# Patient Record
Sex: Female | Born: 1940 | Race: White | Hispanic: No | Marital: Married | Smoking: Never smoker
Health system: Southern US, Community
[De-identification: ages and names within clinical notes are randomized; demographics above are authoritative.]

## PROBLEM LIST (undated history)

## (undated) DIAGNOSIS — E785 Hyperlipidemia, unspecified: Secondary | ICD-10-CM

## (undated) DIAGNOSIS — T7840XA Allergy, unspecified, initial encounter: Secondary | ICD-10-CM

## (undated) DIAGNOSIS — I10 Essential (primary) hypertension: Secondary | ICD-10-CM

## (undated) DIAGNOSIS — I480 Paroxysmal atrial fibrillation: Secondary | ICD-10-CM

## (undated) DIAGNOSIS — G43909 Migraine, unspecified, not intractable, without status migrainosus: Secondary | ICD-10-CM

## (undated) DIAGNOSIS — D649 Anemia, unspecified: Secondary | ICD-10-CM

## (undated) HISTORY — PX: BREAST BIOPSY: SHX20

## (undated) HISTORY — DX: Anemia, unspecified: D64.9

## (undated) HISTORY — DX: Hyperlipidemia, unspecified: E78.5

## (undated) HISTORY — PX: APPENDECTOMY: SHX54

## (undated) HISTORY — DX: Allergy, unspecified, initial encounter: T78.40XA

## (undated) HISTORY — PX: WISDOM TOOTH EXTRACTION: SHX21

## (undated) HISTORY — DX: Paroxysmal atrial fibrillation: I48.0

## (undated) HISTORY — DX: Migraine, unspecified, not intractable, without status migrainosus: G43.909

## (undated) HISTORY — DX: Essential (primary) hypertension: I10

---

## 1968-09-15 HISTORY — PX: ECTOPIC PREGNANCY SURGERY: SHX613

## 1998-11-26 ENCOUNTER — Ambulatory Visit (HOSPITAL_BASED_OUTPATIENT_CLINIC_OR_DEPARTMENT_OTHER): Admission: RE | Admit: 1998-11-26 | Discharge: 1998-11-26 | Payer: Self-pay

## 1999-10-02 ENCOUNTER — Encounter: Payer: Self-pay | Admitting: Family Medicine

## 1999-10-02 ENCOUNTER — Encounter: Admission: RE | Admit: 1999-10-02 | Discharge: 1999-10-02 | Payer: Self-pay | Admitting: Family Medicine

## 1999-10-04 ENCOUNTER — Ambulatory Visit (HOSPITAL_COMMUNITY): Admission: RE | Admit: 1999-10-04 | Discharge: 1999-10-04 | Payer: Self-pay | Admitting: Gastroenterology

## 1999-10-07 ENCOUNTER — Encounter: Admission: RE | Admit: 1999-10-07 | Discharge: 1999-10-07 | Payer: Self-pay | Admitting: Family Medicine

## 1999-10-07 ENCOUNTER — Encounter: Payer: Self-pay | Admitting: Family Medicine

## 1999-11-29 ENCOUNTER — Encounter: Payer: Self-pay | Admitting: Family Medicine

## 1999-11-29 ENCOUNTER — Ambulatory Visit (HOSPITAL_COMMUNITY): Admission: RE | Admit: 1999-11-29 | Discharge: 1999-11-29 | Payer: Self-pay | Admitting: Family Medicine

## 2000-02-19 DIAGNOSIS — C4492 Squamous cell carcinoma of skin, unspecified: Secondary | ICD-10-CM

## 2000-02-19 DIAGNOSIS — C4491 Basal cell carcinoma of skin, unspecified: Secondary | ICD-10-CM

## 2000-02-19 HISTORY — DX: Squamous cell carcinoma of skin, unspecified: C44.92

## 2000-02-19 HISTORY — DX: Basal cell carcinoma of skin, unspecified: C44.91

## 2000-10-05 ENCOUNTER — Encounter: Payer: Self-pay | Admitting: Family Medicine

## 2000-10-05 ENCOUNTER — Encounter: Admission: RE | Admit: 2000-10-05 | Discharge: 2000-10-05 | Payer: Self-pay | Admitting: Family Medicine

## 2000-12-15 ENCOUNTER — Ambulatory Visit (HOSPITAL_BASED_OUTPATIENT_CLINIC_OR_DEPARTMENT_OTHER): Admission: RE | Admit: 2000-12-15 | Discharge: 2000-12-15 | Payer: Self-pay | Admitting: Plastic Surgery

## 2001-02-11 ENCOUNTER — Other Ambulatory Visit: Admission: RE | Admit: 2001-02-11 | Discharge: 2001-02-11 | Payer: Self-pay | Admitting: Obstetrics and Gynecology

## 2001-02-11 ENCOUNTER — Encounter (INDEPENDENT_AMBULATORY_CARE_PROVIDER_SITE_OTHER): Payer: Self-pay

## 2001-10-06 ENCOUNTER — Encounter: Admission: RE | Admit: 2001-10-06 | Discharge: 2001-10-06 | Payer: Self-pay | Admitting: Family Medicine

## 2001-10-06 ENCOUNTER — Encounter: Payer: Self-pay | Admitting: Family Medicine

## 2001-10-08 ENCOUNTER — Encounter: Admission: RE | Admit: 2001-10-08 | Discharge: 2001-10-08 | Payer: Self-pay | Admitting: Family Medicine

## 2001-10-08 ENCOUNTER — Encounter: Payer: Self-pay | Admitting: Family Medicine

## 2001-10-20 DIAGNOSIS — D229 Melanocytic nevi, unspecified: Secondary | ICD-10-CM

## 2001-10-20 HISTORY — DX: Melanocytic nevi, unspecified: D22.9

## 2001-12-07 ENCOUNTER — Ambulatory Visit (HOSPITAL_BASED_OUTPATIENT_CLINIC_OR_DEPARTMENT_OTHER): Admission: RE | Admit: 2001-12-07 | Discharge: 2001-12-07 | Payer: Self-pay | Admitting: Plastic Surgery

## 2001-12-07 ENCOUNTER — Encounter (INDEPENDENT_AMBULATORY_CARE_PROVIDER_SITE_OTHER): Payer: Self-pay | Admitting: Specialist

## 2002-10-19 ENCOUNTER — Encounter: Payer: Self-pay | Admitting: Family Medicine

## 2002-10-19 ENCOUNTER — Encounter: Admission: RE | Admit: 2002-10-19 | Discharge: 2002-10-19 | Payer: Self-pay | Admitting: Family Medicine

## 2003-01-13 ENCOUNTER — Other Ambulatory Visit: Admission: RE | Admit: 2003-01-13 | Discharge: 2003-01-13 | Payer: Self-pay | Admitting: Family Medicine

## 2003-10-11 ENCOUNTER — Encounter: Admission: RE | Admit: 2003-10-11 | Discharge: 2003-10-11 | Payer: Self-pay | Admitting: Family Medicine

## 2003-11-01 ENCOUNTER — Encounter: Admission: RE | Admit: 2003-11-01 | Discharge: 2003-11-01 | Payer: Self-pay | Admitting: Family Medicine

## 2004-01-24 ENCOUNTER — Other Ambulatory Visit: Admission: RE | Admit: 2004-01-24 | Discharge: 2004-01-24 | Payer: Self-pay | Admitting: Family Medicine

## 2004-02-07 DIAGNOSIS — C4491 Basal cell carcinoma of skin, unspecified: Secondary | ICD-10-CM

## 2004-02-07 HISTORY — DX: Basal cell carcinoma of skin, unspecified: C44.91

## 2004-03-21 ENCOUNTER — Ambulatory Visit (HOSPITAL_COMMUNITY): Admission: RE | Admit: 2004-03-21 | Discharge: 2004-03-21 | Payer: Self-pay | Admitting: Plastic Surgery

## 2004-03-21 ENCOUNTER — Ambulatory Visit (HOSPITAL_BASED_OUTPATIENT_CLINIC_OR_DEPARTMENT_OTHER): Admission: RE | Admit: 2004-03-21 | Discharge: 2004-03-21 | Payer: Self-pay | Admitting: Plastic Surgery

## 2004-03-21 ENCOUNTER — Encounter (INDEPENDENT_AMBULATORY_CARE_PROVIDER_SITE_OTHER): Payer: Self-pay | Admitting: Specialist

## 2004-09-15 HISTORY — PX: COLONOSCOPY: SHX174

## 2005-01-22 ENCOUNTER — Encounter: Admission: RE | Admit: 2005-01-22 | Discharge: 2005-01-22 | Payer: Self-pay | Admitting: Family Medicine

## 2005-03-05 ENCOUNTER — Other Ambulatory Visit: Admission: RE | Admit: 2005-03-05 | Discharge: 2005-03-05 | Payer: Self-pay | Admitting: Family Medicine

## 2005-10-29 ENCOUNTER — Encounter: Admission: RE | Admit: 2005-10-29 | Discharge: 2005-10-29 | Payer: Self-pay | Admitting: Family Medicine

## 2006-01-26 ENCOUNTER — Encounter: Admission: RE | Admit: 2006-01-26 | Discharge: 2006-01-26 | Payer: Self-pay | Admitting: Family Medicine

## 2006-10-27 ENCOUNTER — Ambulatory Visit: Payer: Self-pay | Admitting: Internal Medicine

## 2007-01-29 ENCOUNTER — Encounter: Admission: RE | Admit: 2007-01-29 | Discharge: 2007-01-29 | Payer: Self-pay | Admitting: Internal Medicine

## 2007-02-23 ENCOUNTER — Ambulatory Visit: Payer: Self-pay | Admitting: Internal Medicine

## 2007-02-23 LAB — CONVERTED CEMR LAB
AST: 32 units/L (ref 0–37)
BUN: 17 mg/dL (ref 6–23)
Creatinine, Ser: 0.7 mg/dL (ref 0.4–1.2)
Total CHOL/HDL Ratio: 3.8
VLDL: 42 mg/dL — ABNORMAL HIGH (ref 0–40)

## 2007-06-29 ENCOUNTER — Ambulatory Visit: Payer: Self-pay | Admitting: Internal Medicine

## 2007-10-14 ENCOUNTER — Encounter: Payer: Self-pay | Admitting: Internal Medicine

## 2007-10-14 DIAGNOSIS — Z862 Personal history of diseases of the blood and blood-forming organs and certain disorders involving the immune mechanism: Secondary | ICD-10-CM | POA: Insufficient documentation

## 2007-10-14 DIAGNOSIS — E7800 Pure hypercholesterolemia, unspecified: Secondary | ICD-10-CM | POA: Insufficient documentation

## 2007-10-14 DIAGNOSIS — I10 Essential (primary) hypertension: Secondary | ICD-10-CM | POA: Insufficient documentation

## 2007-10-14 DIAGNOSIS — M1710 Unilateral primary osteoarthritis, unspecified knee: Secondary | ICD-10-CM

## 2007-10-14 DIAGNOSIS — J309 Allergic rhinitis, unspecified: Secondary | ICD-10-CM | POA: Insufficient documentation

## 2007-10-14 DIAGNOSIS — Z87898 Personal history of other specified conditions: Secondary | ICD-10-CM

## 2007-10-14 DIAGNOSIS — Z9189 Other specified personal risk factors, not elsewhere classified: Secondary | ICD-10-CM | POA: Insufficient documentation

## 2007-10-15 ENCOUNTER — Ambulatory Visit: Payer: Self-pay | Admitting: Internal Medicine

## 2007-10-15 DIAGNOSIS — L723 Sebaceous cyst: Secondary | ICD-10-CM

## 2007-10-25 ENCOUNTER — Ambulatory Visit: Payer: Self-pay | Admitting: Internal Medicine

## 2007-10-29 ENCOUNTER — Ambulatory Visit: Payer: Self-pay | Admitting: Internal Medicine

## 2007-11-01 ENCOUNTER — Encounter: Payer: Self-pay | Admitting: Internal Medicine

## 2007-11-19 ENCOUNTER — Encounter: Payer: Self-pay | Admitting: Internal Medicine

## 2008-01-17 ENCOUNTER — Ambulatory Visit: Payer: Self-pay | Admitting: Internal Medicine

## 2008-01-17 LAB — CONVERTED CEMR LAB
Bilirubin, Direct: 0.1 mg/dL (ref 0.0–0.3)
Calcium: 9.6 mg/dL (ref 8.4–10.5)
GFR calc Af Amer: 92 mL/min
GFR calc non Af Amer: 76 mL/min
HDL: 39.6 mg/dL (ref >39.0)
LDL Cholesterol: 62 mg/dL (ref 0–99)
Sodium: 142 meq/L (ref 135–145)
Total Bilirubin: 0.8 mg/dL (ref 0.3–1.2)
Total CHOL/HDL Ratio: 3.2
Total Protein: 7.2 g/dL (ref 6.0–8.3)
VLDL: 27 mg/dL (ref 0–40)

## 2008-01-20 ENCOUNTER — Ambulatory Visit: Payer: Self-pay | Admitting: Internal Medicine

## 2008-02-10 ENCOUNTER — Encounter: Admission: RE | Admit: 2008-02-10 | Discharge: 2008-02-10 | Payer: Self-pay | Admitting: Internal Medicine

## 2008-02-10 ENCOUNTER — Encounter: Payer: Self-pay | Admitting: Internal Medicine

## 2008-04-25 ENCOUNTER — Ambulatory Visit: Payer: Self-pay | Admitting: Internal Medicine

## 2008-06-20 ENCOUNTER — Ambulatory Visit: Payer: Self-pay | Admitting: Internal Medicine

## 2008-08-24 ENCOUNTER — Telehealth: Payer: Self-pay | Admitting: Internal Medicine

## 2008-09-05 ENCOUNTER — Telehealth: Payer: Self-pay | Admitting: Internal Medicine

## 2008-12-11 ENCOUNTER — Telehealth (INDEPENDENT_AMBULATORY_CARE_PROVIDER_SITE_OTHER): Payer: Self-pay | Admitting: *Deleted

## 2008-12-18 ENCOUNTER — Ambulatory Visit: Payer: Self-pay | Admitting: Internal Medicine

## 2009-02-27 ENCOUNTER — Encounter: Admission: RE | Admit: 2009-02-27 | Discharge: 2009-02-27 | Payer: Self-pay | Admitting: Internal Medicine

## 2009-04-26 ENCOUNTER — Ambulatory Visit: Payer: Self-pay | Admitting: Internal Medicine

## 2009-04-26 LAB — CONVERTED CEMR LAB
Albumin: 4.1 g/dL (ref 3.5–5.2)
Alkaline Phosphatase: 106 units/L (ref 39–117)
BUN: 13 mg/dL (ref 6–23)
Basophils Relative: 0.5 % (ref 0.0–3.0)
CO2: 32 meq/L (ref 19–32)
Chloride: 106 meq/L (ref 96–112)
Cholesterol: 139 mg/dL (ref 0–200)
Eosinophils Absolute: 0.2 10*3/uL (ref 0.0–0.7)
Glucose, Bld: 94 mg/dL (ref 70–99)
Hemoglobin: 13.8 g/dL (ref 12.0–15.0)
Lymphocytes Relative: 27.1 % (ref 12.0–46.0)
MCHC: 34.1 g/dL (ref 30.0–36.0)
Monocytes Relative: 7.6 % (ref 3.0–12.0)
Neutro Abs: 4.1 10*3/uL (ref 1.4–7.7)
Potassium: 4.4 meq/L (ref 3.5–5.1)
RBC: 4.4 M/uL (ref 3.87–5.11)
Total CHOL/HDL Ratio: 3
VLDL: 42.8 mg/dL — ABNORMAL HIGH (ref 0.0–40.0)

## 2009-11-05 ENCOUNTER — Telehealth: Payer: Self-pay | Admitting: Internal Medicine

## 2010-01-18 ENCOUNTER — Telehealth: Payer: Self-pay | Admitting: Internal Medicine

## 2010-03-05 ENCOUNTER — Encounter: Admission: RE | Admit: 2010-03-05 | Discharge: 2010-03-05 | Payer: Self-pay | Admitting: Internal Medicine

## 2010-03-05 ENCOUNTER — Encounter: Payer: Self-pay | Admitting: Internal Medicine

## 2010-03-27 ENCOUNTER — Encounter: Payer: Self-pay | Admitting: Internal Medicine

## 2010-05-01 ENCOUNTER — Ambulatory Visit: Payer: Self-pay | Admitting: Internal Medicine

## 2010-05-01 ENCOUNTER — Encounter: Payer: Self-pay | Admitting: Internal Medicine

## 2010-05-01 LAB — CONVERTED CEMR LAB
Albumin: 4.3 g/dL (ref 3.5–5.2)
Basophils Relative: 0.4 % (ref 0.0–3.0)
Chloride: 103 meq/L (ref 96–112)
Cholesterol: 136 mg/dL (ref 0–200)
Eosinophils Absolute: 0.3 10*3/uL (ref 0.0–0.7)
Eosinophils Relative: 3.5 % (ref 0.0–5.0)
HDL goal, serum: 40 mg/dL
HDL: 43.4 mg/dL (ref >39.00)
Iron: 128 ug/dL (ref 42–145)
LDL Cholesterol: 64 mg/dL (ref 0–99)
LDL Goal: 130 mg/dL
Lymphocytes Relative: 32.8 % (ref 12.0–46.0)
MCHC: 34.7 g/dL (ref 30.0–36.0)
Neutrophils Relative %: 55.2 % (ref 43.0–77.0)
Potassium: 4.4 meq/L (ref 3.5–5.1)
RBC: 4.36 M/uL (ref 3.87–5.11)
Saturation Ratios: 33.1 % (ref 20.0–50.0)
Total Protein: 7.4 g/dL (ref 6.0–8.3)
Transferrin: 276 mg/dL (ref 212.0–360.0)
Triglycerides: 145 mg/dL (ref 0.0–149.0)
VLDL: 29 mg/dL (ref 0.0–40.0)
WBC: 7.3 10*3/uL (ref 4.5–10.5)

## 2010-06-13 ENCOUNTER — Ambulatory Visit: Payer: Self-pay | Admitting: Internal Medicine

## 2010-10-17 NOTE — Progress Notes (Signed)
Summary: Referral request  Phone Note Call from Patient Call back at Home Phone [phone]   Summary of Call: Patient left message on triage that she would like referral to [facility] for BD scan. Initial call taken by: Lucious Groves,  Jan 18, 2010 1:49 PM  Follow-up for Phone Call        OK. previous studies at the breast center. [facility] notified Follow-up by: Jacques Navy MD,  Jan 18, 2010 5:21 PM

## 2010-10-17 NOTE — Procedures (Signed)
Summary: Colonoscopy/[facility]  Colonoscopy/[facility]   Imported By: Sherian Rein 04/05/2010 08:41:48  _____________________________________________________________________  External Attachment:    Type:   Image     Comment:   External Document

## 2010-10-17 NOTE — Assessment & Plan Note (Signed)
Summary: cpx-lb   Vital Signs:  Patient profile:   70 year old female Height:      61 inches Weight:      139 pounds BMI:     26.36 O2 Sat:      93 % on Room air Temp:     97.4 degrees F oral Pulse rate:   48 / minute BP sitting:   136 / 88  (left arm) Cuff size:   regular  Vitals Entered By: Bill Salinas CMA (May 01, 2010 9:28 AM)  O2 Flow:  Room air CC: cpx/ab, Lipid Management  Vision Screening:      Vision Comments: July 2010 with [facility]. Normal Exam. Pt is due for yearly exam   Primary Care Provider:  Jacques Navy MD  CC:  cpx/ab and Lipid Management.  History of Present Illness: Patient presents for preventive health visit and routine medical follow-up. She has been doing well: healthy with no major illness, injury or surgery. she has a recurrent cyst on her mid back and under the right breast. She does have a few aches and pains.  Lipid Management History:      Positive NCEP/ATP III risk factors include female age 39 years old or older and hypertension.  Negative NCEP/ATP III risk factors include non-tobacco-user status.      Preventive Screening-Counseling & Management  Alcohol-Tobacco     Alcohol drinks/day: 0     Smoking Status: quit     Year Started: 1960     Year Quit: 1962  Caffeine-Diet-Exercise     Caffeine use/day: one drink per week     Does Patient Exercise: yes     Type of exercise: walking, light weights     Exercise (avg: min/session): 30-60     Times/week: 3  Hep-HIV-STD-Contraception     Dental Visit-last 6 months yes     Sun Exposure-Excessive: no  Safety-Violence-Falls     Seat Belt Use: yes     Firearms in the Home: no firearms in the home     Smoke Detectors: yes     Violence in the Home: no risk noted     Sexual Abuse: no     Fall Risk: low risk      Drug Use:  never.    Current Medications (verified): 1)  Atenolol 100 Mg  Tabs (Atenolol) .... Take 1 Tablet By Mouth Once A Day 2)  Multivitamins   Tabs  (Multiple Vitamin) .... Take 1 Tablet By Mouth Once A Day 3)  Caltrate 600+d 600-400 Mg-Unit  Tabs (Calcium Carbonate-Vitamin D) .... Take 1 Tablet By Mouth Once A Day 4)  Mag-Oxide 400 Mg  Tabs (Magnesium Oxide) .... Take 1 Tablet By Mouth Once A Day 5)  Simvastatin 40 Mg  Tabs (Simvastatin) .Marland Kitchen.. 1 By Mouth Q Pm  Allergies (verified): 1)  ! Pcn 2)  ! Codeine 3)  ! Clindamycin Hcl (Clindamycin Hcl) 4)  ! Tetracycline 5)  ! Erythromycin 6)  ! Sulfa  Past History:  Past Medical History: Last updated: 10/14/2007 Hyperlipidemia Hypertension Allergic rhinitis Anemia-NOS MIGRAINES, HX OF (ICD-V13.8)   GYNECOLOGICAL HISTORY: The patient is a gravida 4, para 3 with 1 ectopic pregnancy  Past Surgical History: Last updated: 10/14/2007 Appendectomy * ECTOPIC PREGNANCY WITH SALPINGECTOMY IN 1970 BREAST BIOPSY, HX OF (ICD-V15.9) WISDOM TEETH EXTRACTION, HX OF (ICD-V15.9)  Family History: Last updated: 04/28/08 father-deceased @ 65: DM, Emphysema mother- deceased @ 64: CAD, colon cancer, breast cancer, HTN, Lipids Sister- lipids,  hypothyroid, CVA  Social History: [employer], [employer] - elementary ed. 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 End-of-life: discussed - DNR if a hopeless or higly unlikely to survive situation. "Laymen's" guide and forms provided. Reviewed Aug '11  Smoking Status:  quit Caffeine use/day:  one drink per week Dental Care w/in 6 mos.:  yes Sun Exposure-Excessive:  no Seat Belt Use:  yes Fall Risk:  low risk Drug Use:  never  Review of Systems  The patient denies anorexia, fever, weight loss, weight gain, decreased hearing, chest pain, syncope, dyspnea on exertion, prolonged cough, abdominal pain, severe indigestion/heartburn, incontinence, muscle weakness, difficulty walking, depression, enlarged lymph nodes, and breast masses.    Physical Exam  General:   WNWD white female in no distress Head:  normocephalic, atraumatic, and no abnormalities observed.   Eyes:  vision grossly intact, pupils equal, pupils round, pupils reactive to light, corneas and lenses clear, and no injection.   Ears:  External ear exam shows no significant lesions or deformities.  Otoscopic examination reveals clear canals, tympanic membranes are intact bilaterally without bulging, retraction, inflammation or discharge. Hearing is grossly normal bilaterally. Nose:  no external deformity and no external erythema.   Mouth:  Oral mucosa and oropharynx without lesions or exudates.  Teeth in good repair. Neck:  supple, full ROM, no thyromegaly, and no carotid bruits.   Chest Wall:  no deformities.   Breasts:  No mass, nodules, thickening, tenderness, bulging, retraction, inflamation, nipple discharge or skin changes noted.   Lungs:  Normal respiratory effort, chest expands symmetrically. Lungs are clear to auscultation, no crackles or wheezes. Heart:  Normal rate and regular rhythm. S1 and S2 normal without gallop, murmur, click, rub or other extra sounds. Abdomen:  soft, non-tender, normal bowel sounds, no guarding, and no hepatomegaly.   Genitalia:  deferred Msk:  normal ROM, no joint tenderness, no joint swelling, no joint warmth, and no joint instability.   Pulses:  2+ radial and dorsalis pedis pulse Extremities:  No clubbing, cyanosis, edema, or deformity noted with normal full range of motion of all joints.   Neurologic:  alert & oriented X3, cranial nerves II-XII intact, strength normal in all extremities, gait normal, and DTRs symmetrical and normal.   Skin:  turgor normal, color normal, no suspicious lesions, and no ulcerations.  Small cyst to the right of mid-line of the spine that is not tender or fluctuant. Small dermal lesion under the right breast which is not suspicious. Cervical Nodes:  no anterior cervical adenopathy and no posterior cervical adenopathy.   Axillary  Nodes:  no R axillary adenopathy and no L axillary adenopathy.   Psych:  Oriented X3, memory intact for recent and remote, normally interactive, good eye contact, and not anxious appearing.     Impression & Recommendations:  Problem # 1:  MIGRAINES, HX OF (ICD-V13.8) None since completing menopause  Problem # 2:  DEGENERATIVE JOINT DISEASE, KNEES, BILATERAL (ICD-715.96) Continues to have intermittent discomfort left knee but does not have any limitations in activity.   Problem # 3:  HYPERTENSION (ICD-401.9)  Her updated medication list for this problem includes:    Atenolol 100 Mg Tabs (Atenolol) .Marland Kitchen... Take 1 tablet by mouth once a day  Orders: TLB-BMP (Basic Metabolic Panel-BMET) (80048-METABOL)  BP today: 136/88 Prior BP: 132/86 (04/26/2009)  10 Yr Risk Heart Disease: 9 %-low risk  Adequate control on present regimen  addendum-labs normal  Problem # 4:  HYPERLIPIDEMIA (ICD-272.4) Tolerating medication without side effects. Routine labs ordered with recommendations to follow.  Her updated medication list for this problem includes:    Simvastatin 40 Mg Tabs (Simvastatin) .Marland Kitchen... 1 by mouth q pm  Orders: TLB-Lipid Panel (80061-LIPID) TLB-Hepatic/Liver Function Pnl (80076-HEPATIC) TLB-TSH (Thyroid Stimulating Hormone) (84443-TSH)  addendum - excellent control of lipids with normal liver functions  Problem # 5:  ANEMIA-NOS (ICD-285.9) No evidence of blood loss. Hgb was normal last year.  Plan - routine lab follow-up.  Orders: TLB-CBC Platelet - w/Differential (85025-CBCD) TLB-IBC Pnl (Iron/FE;Transferrin) (83550-IBC)  Addendum - normal blood count and iron levels.   Will not require future testing unless symptomatic  Problem # 6:  Preventive Health Care (ICD-V70.0) Unremarkable interval history. Physical exam is normal. Current with colorectal cancer screening with last study in '06. Current with mammography and bone density which did reveal osteopenia left hip with  a T-score of -2.2, spine was normal with a T-score of -0.9. Immunization: tetnus '04; pneumonia vaccine '08. Shingles vaccine offered but declined. 12 lead EKG normal  Patient is doing well and has no signs or symptoms of depression. She is 100% independent in ADLs. She has no fall or injury risk.  In summary - a very nice woman who is medically stable. she will return as needed or 1 year.   Orders: [facility] -Subsequent Annual Wellness Visit [phone])  Complete Medication List: 1)  Atenolol 100 Mg Tabs (Atenolol) .... Take 1 tablet by mouth once a day 2)  Multivitamins Tabs (Multiple vitamin) .... Take 1 tablet by mouth once a day 3)  Caltrate 600+d 600-400 Mg-unit Tabs (Calcium carbonate-vitamin d) .... Take 1 tablet by mouth once a day 4)  Mag-oxide 400 Mg Tabs (Magnesium oxide) .... Take 1 tablet by mouth once a day 5)  Simvastatin 40 Mg Tabs (Simvastatin) .Marland Kitchen.. 1 by mouth q pm  Lipid Assessment/Plan:      Based on NCEP/ATP III, the patient's risk factor category is "0-1 risk factors".  The patient's lipid goals are as follows: Total cholesterol goal is 200; LDL cholesterol goal is 130; HDL cholesterol goal is 40; Triglyceride goal is 150.    Patient: Alexandria Mcdowell Note: All result statuses are Final unless otherwise noted.  Tests: (1) BMP (METABOL)   Sodium                    142 mEq/L                   135-145   Potassium                 4.4 mEq/L                   3.5-5.1   Chloride                  103 mEq/L                   96-112   Carbon Dioxide            31 mEq/L                    19-32   Glucose                   80 mg/dL                    60-45  BUN                       16 mg/dL                    8-84   Creatinine                0.7 mg/dL                   1.6-6.0   Calcium                   9.7 mg/dL                   6.3-01.6   GFR                       82.54 mL/min                >60  Tests: (2) Lipid Panel (LIPID)   Cholesterol               136 mg/dL                    0-109     ATP III Classification            Desirable:  < 200 mg/dL                    Borderline High:  200 - 239 mg/dL               High:  > = 240 mg/dL   Triglycerides             145.0 mg/dL                 3.2-355.7     Normal:  <150 mg/dL     Borderline High:  322 - 199 mg/dL   HDL                       02.54 mg/dL                 >27.06   VLDL Cholesterol          29.0 mg/dL                  2.3-76.2   LDL Cholesterol           64 mg/dL                    8-31  CHO/HDL Ratio:  CHD Risk                             3                    Men          Women     1/2 Average Risk     3.4          3.3     Average Risk          5.0          4.4     2X Average Risk          9.6          7.1     3X Average Risk  15.0          11.0                           Tests: (3) Hepatic/Liver Function Panel (HEPATIC)   Total Bilirubin           1.2 mg/dL                   9.8-1.1   Direct Bilirubin          0.2 mg/dL                   9.1-4.7   Alkaline Phosphatase [H]  133 U/L                     39-117   AST                  [H]  45 U/L                      0-37   ALT                  [H]  44 U/L                      0-35   Total Protein             7.4 g/dL                    8.2-9.5   Albumin                   4.3 g/dL                    6.2-1.3  Tests: (4) TSH (TSH)   FastTSH                   1.21 uIU/mL                 0.35-5.50  Tests: (5) CBC Platelet w/Diff (CBCD)   White Cell Count          7.3 K/uL                    4.5-10.5   Red Cell Count            4.36 Mil/uL                 3.87-5.11   Hemoglobin                14.0 g/dL                   08.6-57.8   Hematocrit                40.3 %                      36.0-46.0   MCV                       92.3 fl                     78.0-100.0   MCHC                      34.7 g/dL  30.0-36.0   RDW                       13.5 %                      11.5-14.6   Platelet Count            227.0 K/uL                   150.0-400.0   Neutrophil %              55.2 %                      43.0-77.0   Lymphocyte %              32.8 %                      12.0-46.0   Monocyte %                8.1 %                       3.0-12.0   Eosinophils%              3.5 %                       0.0-5.0   Basophils %               0.4 %                       0.0-3.0   Neutrophill Absolute      4.0 K/uL                    1.4-7.7   Lymphocyte Absolute       2.4 K/uL                    0.7-4.0   Monocyte Absolute         0.6 K/uL                    0.1-1.0  Eosinophils, Absolute                             0.3 K/uL                    0.0-0.7   Basophils Absolute        0.0 K/uL                    0.0-0.1  Tests: (6) IBC Panel (IBC)   Iron                      128 ug/dL                   16-109   Transferrin               276.0 mg/dL                 604.5-409.8   Iron Saturation           33.1 %  20.0-50.0 

## 2010-10-17 NOTE — Assessment & Plan Note (Signed)
Summary: flu-shot-lb  Nurse Visit   Vital Signs:  Patient profile:   70 year old female Temp:     97.6 degrees F oral  Vitals Entered By: Lanier Prude, [facility]) (June 13, 2010 1:22 PM)  Allergies: 1)  ! Pcn 2)  ! Codeine 3)  ! Clindamycin Hcl (Clindamycin Hcl) 4)  ! Tetracycline 5)  ! Erythromycin 6)  ! Sulfa  Orders Added: 1)  Flu Vaccine 27yrs + MEDICARE PATIENTS [Q2039] 2)  Administration Flu vaccine - MCR [G0008] .lbmedflu   Flu Vaccine Consent Questions     Do you have a history of severe allergic reactions to this vaccine? no    Any prior history of allergic reactions to egg and/or gelatin? no    Do you have a sensitivity to the preservative Thimersol? no    Do you have a past history of Guillan-Barre Syndrome? no    Do you currently have an acute febrile illness? no    Have you ever had a severe reaction to latex? no    Vaccine information given and explained to patient? yes    Are you currently pregnant? no    Lot Number:[id number]   Exp Date:03/15/2011   Site Given  Left Deltoid IM Lanier Prude, [facility])  June 13, 2010 1:23 PM

## 2010-10-17 NOTE — Progress Notes (Signed)
Summary: REFILLS  Phone Note Refill Request   Refills Requested: Medication #1:  ATENOLOL 100 MG  TABS Take 1 tablet by mouth once a day  Medication #2:  SIMVASTATIN 40 MG  TABS 1 by mouth q PM. TO GO TO [facility] supply w/3 refills, OK?   Initial call taken by: Lamar Sprinkles, CMA,  November 05, 2009 9:09 AM  Follow-up for Phone Call        ok for 3 month refills Follow-up by: Jacques Navy MD,  November 05, 2009 4:04 PM    Prescriptions: SIMVASTATIN 40 MG  TABS (SIMVASTATIN) 1 by mouth q PM  #90 x 3   Entered by:   Lamar Sprinkles, CMA   Authorized by:   Jacques Navy MD   Signed by:   Lamar Sprinkles, CMA on 11/05/2009   Method used:   Electronically to        [facility]* (mail-order)             ,          Ph: [phone]       Fax: [phone]   RxID:   [id number] ATENOLOL 100 MG  TABS (ATENOLOL) Take 1 tablet by mouth once a day  #90 x 3   Entered by:   Lamar Sprinkles, CMA   Authorized by:   Jacques Navy MD   Signed by:   Lamar Sprinkles, CMA on 11/05/2009   Method used:   Electronically to        [facility]* (mail-order)             ,          Ph: [phone]       Fax: [phone]   RxID:   [id number]

## 2010-12-27 ENCOUNTER — Telehealth: Payer: Self-pay | Admitting: *Deleted

## 2010-12-27 MED ORDER — ATENOLOL 100 MG PO TABS: 100.0000 mg | ORAL_TABLET | Freq: Every day | ORAL | Status: DC

## 2010-12-27 MED ORDER — SIMVASTATIN 40 MG PO TABS: 40.0000 mg | ORAL_TABLET | Freq: Every day | ORAL | Status: DC

## 2010-12-27 NOTE — Telephone Encounter (Signed)
Refill sent in

## 2011-01-28 NOTE — Assessment & Plan Note (Signed)
[facility]                           PRIMARY CARE OFFICE NOTE   Alexandria Mcdowell, Alexandria Mcdowell                    MRN:          [id number]  DATE:02/23/2007                            DOB:          October 07, 1940    Alexandria Mcdowell was seen as a new patient October 26, 2006.  Please see  that complete dictation.  This was reviewed with the patient in detail  with no additions or corrections.  In the interval, she has been doing  well and feeling well with no active medical complaints.   REVIEW OF SYSTEMS:  Negative for any constitutional, cardiovascular,  respiratory, GI, or GU problems.   CURRENT MEDICATIONS:  1. Crestor 10 mg daily.  2. Atenolol 100 mg daily.  3. Multivitamins.  4. Calcium with D.  5. Magnesium oxide 400 mg daily.   EXAMINATION:  Temperature 97.2, blood pressure 117/70, pulse 50, weight  141.  GENERAL APPEARANCE:  This is a well-developed, well-nourished woman in  no acute distress.  HEENT:  Normocephalic, atraumatic.  EACs and TMs unremarkable.  Oropharynx with native dentition in good repair.  No buccal or palatal  lesions were noted.  Posterior pharynx was clear.  Conjunctivae, sclerae  clear.  PERRLA.  EOMI.  Funduscopic exam was unremarkable.  NECK:  Supple without thyromegaly.  NODES:  No adenopathy was noted in the cervical or supraclavicular  regions.  CHEST:  No CVA tenderness.  LUNGS:  Clear to auscultation and percussion.  BREASTS:  Skin was normal.  Nipples without discharge.  No fixed mass,  lesion, or abnormality was noted.  CARDIOVASCULAR:  2+ radial pulses.  No JVD or carotid bruits was noted.  She had a regular rate and rhythm without murmurs, rubs, or gallops.  ABDOMEN:  Soft.  No guarding.  No rebound.  No hepatosplenomegaly was  appreciated.  PELVIC AND RECTAL:  Deferred.  EXTREMITIES:  Without clubbing, cyanosis, edema, or deformity.  NEUROLOGIC:  Nonfocal.  SKIN:  Clear.   DATABASE:  A 12-lead  electrocardiogram revealed a normal sinus rhythm  with no abnormalities noted.  Cholesterol was 156, triglycerides 208.  HDL was 40.9, LDL cholesterol 67.1.  Serum glucose was normal at 95,  creatinine normal at 0.7, BUN normal at 17.  Liver function tests were  normal with an SGPT and SGOT.   ASSESSMENT AND PLAN:  1. Hyperlipidemia, excellent control.  The patient will continue on      Crestor.  2. Hypertension.  The patient's blood pressure is very well      controlled, and she will continue on atenolol.   HEALTH MAINTENANCE:  The patient's last mammogram was on Jan 29, 2007  and was a normal unremarkable study.  Last colonoscopy was in 2006.   SUMMARY:  This is a very pleasant patient who is medically stable at  this time.  She is asked to return to see me on a p.r.n. basis.  She was  given pneumonia vaccine at today's visit.     Rosalyn Gess Norins, MD  Electronically Signed    MEN/MedQ  DD: 02/24/2007  DT: 02/24/2007  Job #:  [id number]   cc:   Rolla Flatten

## 2011-01-31 NOTE — Op Note (Signed)
[facility]  Patient:    Alexandria Mcdowell, Alexandria Mcdowell                    MRN: [id number] Proc. Date: 12/15/00 Adm. Date:  [id number] Attending:  Eloise Levels CC:         [facility]. Danella Deis, M.D.   Operative Report  PREOPERATIVE DIAGNOSES: 1. 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 2. 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  POSTOPERATIVE DIAGNOSES: 1. 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 2. 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  PROCEDURES: 1. Excision of 1.5 cm squamous cell carcinoma in situ of the left thigh. 2. Intermediate closure of 3.0 cm incision, left thigh. 3. Excision of 1.0 cm squamous cell carcinoma, medial left lower leg. 4. Intermediate closure of 2.2 cm incision, medial left lower leg.  SURGEON:  Mary A. Contogiannis, M.D.  ANESTHESIA:  IV sedation along with 1% lidocaine with epinephrine.  ANESTHESIOLOGIST:  Maren Beach, M.D.  COMPLICATIONS:  None.  INDICATIONS FOR THE PROCEDURE:  The patient is a 70 year old Caucasian female who has had several squamous cell carcinoma in situs in the past excised. Recently she had two lesions on her left lower extremity biopsied in the office, and the biopsy shows that they are squamous cell carcinoma in situ. The patient presents to undergo excision of these two lesions.  DESCRIPTION OF PROCEDURE:  The patient was marked in the preoperative holding area and brought back to the operating room and placed on the OR table in supine position.  After adequate IV sedation was obtained, the patients left lower extremity was prepped with Betadine and draped in a sterile fashion. The skin and subcutaneous tissues around both of these squamous cell carcinomas were then injected with 1% lidocaine with epinephrine.  After adequate hemostasis and anesthesia had taken effect, the procedure was begun. First the  left thigh skin cancer was removed.  The 1.5 cm squamous cell carcinoma in situ was removed with a few millimeters of normal skin all around in an elliptical fashion full-thickness through the skin into the subcutaneous fatty tissues.  This lesion was then marked at the 12 oclock position and passed off the table to undergo permanent pathologic section evaluation. Meticulous hemostasis was obtained of the wound.  The skin edges were then undermined for easy closure.  The deeper subcutaneous tissues were closed with 3-0 Monocryl interrupted sutures.  The dermis was closed with 3-0 Monocryl interrupted sutures.  The epidermis was closed using a 4-0 Monocryl running intracuticular stitch.  Attention was then turned to the medial left lower leg skin cancer.  This 1.0 cm skin cancer was excised with a few millimeters of normal skin around it.  The excision was performed in an elliptical fashion full-thickness through the skin into the subcutaneous fatty tissues.  The skin edges were then undermined for easy closure.  Meticulous hemostasis was obtained with the Bovie electrocautery.  The deeper subcutaneous tissues were closed using 3-0 Monocryl interrupted sutures.  The dermis was then closed using 3-0 Monocryl interrupted sutures followed by a 4-0 Monocryl running intracuticular stitch on the epidermis.  Both incisions were dressed with benzoin and Steri-Strips, followed by 4 x 4 gauze.  There were no complications.  The patient tolerated the procedure well.  She was then awakened from the IV sedation and taken to  the recovery room in stable condition.  She was ambulated and then discharged home in stable condition in the care of her husband.  Follow-up appointment will be tomorrow in the office. DD:  12/15/00 TD:  12/16/00 Job: [id number]

## 2011-01-31 NOTE — Op Note (Signed)
[facility]  Patient:    Alexandria Mcdowell, Alexandria Mcdowell Visit Number: [id number] MRN: [id number]          Service Type: Attending:  Mary A. Contogiannis, M.D. Dictated by:   Mary A. Contogiannis, M.D. Proc. 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  PROCEDURE:  Excision of 1.02 cm squamous cell carcinoma - right ear lobe with intraoperative frozen section diagnosis.  ATTENDING SURGEON:  Mary A. Contogiannis, M.D.  ANESTHESIA:  Lidocaine 1% with epinephrine.  COMPLICATIONS: None.  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  She now presents to undergo excision of that lesion.  DESCRIPTION OF PROCEDURE:  The patient was brought to the procedure room and placed on the table in supine position.  The skin and subcutaneous tissues in the area of the squamous cell cancer was injected with 1% lidocaine with epinephrine.  The right ear was then prepped with Betadine and draped in a sterile fashion.  After adequate anesthesia and hemostasis had taken effect, the procedure was begun.  Using loupe magnification, the squamous cell cancer was excised with at least 1 cm of normal-appearing skin around it.  The specimen was then marked at the 12 oclock position and passed off the table to undergo intraoperative frozen section diagnosis.  After consulting with the pathologist, Dr. Lawerance Cruel, he felt that the squamous cell cancer had been excised with surgical margins that showed no evidence of cancer.  There was some squamous atypia and actinic damage present in the skin at the margins, however this is frequently seen in sun damaged skin and there was no evidence of carcinoma at the margins.  The incision was then  closed using 6-0 Prolene suture.  The incision was dressed with bacitracin ointment.  There were no complications.  The patient tolerated the procedure well.  She was then taught proper wound care and discharged home in stable condition.  Follow-up appointment will be tomorrow in the office. Dictated by:   Mary A. Contogiannis, M.D. Attending:  Mary A. Contogiannis, M.D. DD:  12/07/01 TD:  12/08/01 Job: [id number]

## 2011-01-31 NOTE — Assessment & Plan Note (Signed)
[facility]                           PRIMARY CARE OFFICE NOTE   Alexandria Mcdowell, Alexandria Mcdowell                    MRN:          [id number]  DATE:10/26/2006                            DOB:          [birth year]    Alexandria Mcdowell is a pleasant 70 year old woman transferring from [facility]  Primary Care due to insurance changes.   CHIEF COMPLAINT:  The patient is concerned for possible thyroid disease  with a history of thyroid disease in her family and raised concerns.  She thinks she has had some symptoms including dry skin that may be  related to thyroid abnormality.  She has no other active complaints at  this time.   PAST HISTORY:   SURGICAL:  1. Appendectomy in 1970.  2. Wisdom teeth extracted in the 60s.  3. Ectopic pregnancy with salpingectomy in 1970.  4. Breast biopsy on the left in 1988.  5. Breast biopsy on the left in 1990.   GYNECOLOGICAL HISTORY:  The patient is a gravida 4, para 3 with 1  ectopic pregnancy.   MEDICAL:  1. The patient had the usual childhood diseases.  2. Remote history of anemia.  3. Hay fever allergies.  4. Hypertension.  5. Hyperlipidemia.  6. Menstrual migraines, now resolved.  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   CURRENT MEDICATIONS:  1. Crestor 10 mg daily.  2. Atenolol 100 mg daily.   DRUG ALLERGIES:  CLINDAMYCIN causing rash.   INTOLERANCES AND SENSITIVITIES:  To TETRACYCLINE, CODEINE, ERYTHROMYCIN  and SULFA, all with nausea.   FAMILY HISTORY:  Mother with colon cancer; mother also had breast cancer  and a maternal aunt with breast cancer.  Mother with hyperlipidemia.  Heart disease in a paternal grandmother and a paternal aunt.  Hypertension in her mother and a sister.  Nephrosis in a sister.  Diabetes in her father and paternal uncle.   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  The patient taught for 2-1/2 years  and then devoted herself to  raising her family.  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  The patient has 3 sons.  She has 5 grandchildren  with 1 step-grandchild.  She has been married for 44 years and her  marriage is stable.   HABITS:  Alcohol:  None.  Nicotine:  None.   REVIEW OF SYSTEMS:  The patient has had no fevers, sweats, chills or  other constitutional symptoms.  Last eye exam was in the summer of 2007.  No ENT, cardiovascular, respiratory, GI, GU or musculoskeletal  complaints.   LIMITED EXAM:  VITAL SIGNS:  Temperature was 97.8, blood pressure  158/71, pulse 56, weight 140.  GENERAL APPEARANCE:  A well-developed, well-nourished Caucasian woman in  no acute distress.  NECK:  Supple with easily palpable thyroid that was normal in size with  no nodules or abnormalities noted.  CARDIOVASCULAR:  The patient had a regular rate and rhythm without  murmurs, rubs, or gallops.   No further exam  conducted.   ASSESSMENT AND PLAN:  1. Thyroid:  The patient is concerned about thyroid abnormalities.      She has requested her records from Dr. Tiburcio Pea.  I will review these      and if she has not had recent thyroid laboratories, we will obtain      those to rule out any thyroid disease.  2. Hypertension:  The patient's blood pressure is borderline-      controlled.  We will recheck at her next office visit in May.  If      continuing to be on the borderline, would modify her medications.  3. Hyperlipidemia:  The patient is on Crestor.  We will obtain      laboratories from her previous physician and make recommendations      accordingly.  4. Health maintenance:  The patient's last colonoscopy was in 2006.      Her last Pap smear was in 2006.   SUMMARY:  This is a very pleasant woman who has established for ongoing  care.  We will review her records when available.  I have asked the  patient to return in May for a full physical  exam and to review her labs  and make any suggestions that are appropriate.     Alexandria Gess Norins, MD  Electronically Signed   MEN/MedQ  DD: 10/28/2006  DT: 10/28/2006  Job #: [id number]   cc:   [address] [place], [place] [address] [place]

## 2011-01-31 NOTE — Op Note (Signed)
Alexandria Mcdowell, Alexandria Mcdowell                       ACCOUNT NO.:  [id number]   MEDICAL RECORD NO.:  [id number]                   PATIENT TYPE:  AMB   LOCATION:  [facility]                                  FACILITY:  [facility]   PHYSICIAN:  Brantley Persons, M.D.             DATE OF BIRTH:  1940-11-30   DATE OF PROCEDURE:  03/21/2004  DATE OF DISCHARGE:                                 OPERATIVE REPORT   PREOPERATIVE DIAGNOSIS:  Basal cell cancer, left anterior lower leg.   POSTOPERATIVE DIAGNOSIS:  Basal cell cancer, left anterior lower leg.   PROCEDURE:  1. Excision of 1.2 cm basal cell cancer, left anterior lower leg with     interoperative frozen section diagnosis.  2. Complex closure of 4 cm left anterior lower leg incision.   SURGEON:  Mary A. Contogiannis, M.D.   ANESTHESIA:  1% lidocaine with epinephrine.   COMPLICATIONS:  None.   INDICATIONS FOR PROCEDURE:  The patient is a 70 year old Caucasian female  who is referred by Dr. 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  The  pathology report was reviewed with Dr. Gerlene Burdock and he concurs that the  basal cell cancer is more advanced in its growth than the squamous cell  carcinoma in situ.  I have discussed this with the patient and due to the  fact that both skin lesions are in such close proximity on the anterior  lower leg, it would be difficult for me to remove both of them at this time.  The skin edges likely will not be able to be closed and she would have to  have an open wound to heal in.  I will, therefore, proceed first with  excision of the basal cell cancer and after that has healed in and the skin  and soft tissues softened again, I will proceed with excision of the  squamous cell cancer in situ.   PROCEDURE:  The patient was brought into the minor room and placed on the  table in supine position.  The left lower extremity was prepped with  Betadine and draped in a sterile fashion.  The skin and subcutaneous tissues  in the area of the basal cell cancer were then injected with 1% lidocaine  with epinephrine.  After adequate hemostasis and anesthesia had taken  effect, the procedure was begun.  Using loupe magnification, the borders of  the healing biopsy site and any residual basal cell cancer were identified.  1-2 mm clinically normal skin margins were marked circumferentially around  the basal cell cancer.  The lesion was then excised full thickness through  the skin into the subcutaneous tissues marked at the 12 o'clock position  with a silk suture and passed off the table to undergo interoperative frozen  section diagnosis.  While I was on standby, Dr. Laureen Ochs, the pathologist, read  the frozen section.  He felt that all the basal cell cancer had been removed  and the surgical margins were clear.  I then proceeded with complex closure  of the incision.  The skin edges were widely undermined for easier closure.  The superficial fascial layer was closed using 3-0 Monocryl suture.  The  dermal layer was then closed with 3-0 Monocryl suture.  The skin was closed  with a 4-0 Monocryl running intracuticular stitch.  The incision was dressed  with Xeroform followed by  4 by 4 gauze and tape.  There were no complications.  The patient tolerated  the procedure well.  The patient was then given proper postoperative wound  care instructions and discharged home in stable condition.  Follow up  appointment will be tomorrow in the office.                                               Brantley Persons, M.D.    MC/MEDQ  D:  03/21/2004  T:  03/21/2004  Job:  [id number]

## 2011-02-04 ENCOUNTER — Other Ambulatory Visit: Payer: Self-pay | Admitting: Internal Medicine

## 2011-02-04 DIAGNOSIS — Z1231 Encounter for screening mammogram for malignant neoplasm of breast: Secondary | ICD-10-CM

## 2011-03-07 ENCOUNTER — Ambulatory Visit
Admission: RE | Admit: 2011-03-07 | Discharge: 2011-03-07 | Disposition: A | Discharge status: Home / Self Care | Payer: Medicare Other | Source: Ambulatory Visit | Attending: Internal Medicine | Admitting: Internal Medicine

## 2011-03-07 DIAGNOSIS — Z1231 Encounter for screening mammogram for malignant neoplasm of breast: Secondary | ICD-10-CM

## 2011-05-05 ENCOUNTER — Ambulatory Visit (INDEPENDENT_AMBULATORY_CARE_PROVIDER_SITE_OTHER): Payer: Medicare Other | Admitting: Internal Medicine

## 2011-05-05 ENCOUNTER — Other Ambulatory Visit: Payer: Self-pay | Admitting: Internal Medicine

## 2011-05-05 ENCOUNTER — Other Ambulatory Visit (INDEPENDENT_AMBULATORY_CARE_PROVIDER_SITE_OTHER): Payer: Medicare Other

## 2011-05-05 VITALS — BP 148/82 | HR 44 | Temp 97.6°F | Ht 62.0 in | Wt 141.0 lb

## 2011-05-05 DIAGNOSIS — I10 Essential (primary) hypertension: Secondary | ICD-10-CM

## 2011-05-05 DIAGNOSIS — D649 Anemia, unspecified: Secondary | ICD-10-CM

## 2011-05-05 DIAGNOSIS — M1710 Unilateral primary osteoarthritis, unspecified knee: Secondary | ICD-10-CM

## 2011-05-05 DIAGNOSIS — Z136 Encounter for screening for cardiovascular disorders: Secondary | ICD-10-CM

## 2011-05-05 DIAGNOSIS — E785 Hyperlipidemia, unspecified: Secondary | ICD-10-CM

## 2011-05-05 DIAGNOSIS — Z0000 Encounter for general adult medical examination without abnormal findings: Secondary | ICD-10-CM

## 2011-05-05 LAB — HEPATIC FUNCTION PANEL
ALT: 43 U/L — ABNORMAL HIGH (ref 0–35)
AST: 44 U/L — ABNORMAL HIGH (ref 0–37)
Bilirubin, Direct: 0.1 mg/dL (ref 0.0–0.3)
Total Protein: 7.8 g/dL (ref 6.0–8.3)

## 2011-05-05 LAB — CBC WITH DIFFERENTIAL/PLATELET
Basophils Absolute: 0 10*3/uL (ref 0.0–0.1)
Basophils Relative: 0.2 % (ref 0.0–3.0)
Eosinophils Relative: 2.4 % (ref 0.0–5.0)
HCT: 42.1 % (ref 36.0–46.0)
Hemoglobin: 14.4 g/dL (ref 12.0–15.0)
Lymphs Abs: 2.3 10*3/uL (ref 0.7–4.0)
Monocytes Relative: 8.9 % (ref 3.0–12.0)
Neutro Abs: 4.2 10*3/uL (ref 1.4–7.7)
RBC: 4.54 Mil/uL (ref 3.87–5.11)
RDW: 13.6 % (ref 11.5–14.6)

## 2011-05-05 LAB — TSH: TSH: 1.11 u[IU]/mL (ref 0.35–5.50)

## 2011-05-05 LAB — COMPREHENSIVE METABOLIC PANEL
ALT: 43 U/L — ABNORMAL HIGH (ref 0–35)
AST: 44 U/L — ABNORMAL HIGH (ref 0–37)
BUN: 19 mg/dL (ref 6–23)
CO2: 30 mEq/L (ref 19–32)
Creatinine, Ser: 0.9 mg/dL (ref 0.4–1.2)
GFR: 70.13 mL/min (ref >60.00)
Total Bilirubin: 1.1 mg/dL (ref 0.3–1.2)

## 2011-05-05 LAB — LIPID PANEL: HDL: 47.8 mg/dL (ref >39.00)

## 2011-05-05 NOTE — Progress Notes (Signed)
Subjective:    Patient ID: Alexandria Mcdowell, female    DOB: [birth year], 70 y.o.   MRN: [id number]  HPI  The patient is here for annual Medicare wellness examination and management of other chronic and acute problems. Feeling well and has not c/o today.    The risk factors are reflected in the social history.  The roster of all physicians providing medical care to patient - is listed in the Snapshot section of the chart.  Activities of daily living:  The patient is 100% inedpendent in all ADLs: dressing, toileting, feeding as well as independent mobility  Home safety : The patient has smoke detectors in the home. They wear seatbelts. No firearms at home. There is no violence in the home.   There is no risks for hepatitis, STDs or HIV. There is history of blood transfusion in 1970. They have no travel history to infectious disease endemic areas of the world.  The patient has seen their dentist in the last six month. They have seen their eye doctor in the last year, future appointment coming up. They admit to any hearing difficulty and have not had audiologic testing in the last year.  They do not  have excessive sun exposure. Discussed the need for sun protection: hats, long sleeves and use of sunscreen if there is significant sun exposure.   Diet: the importance of a healthy diet is discussed. They do have a healthy diet.  The patient has a regular exercise program: cardio, zumba , 1 hour duration, 4 times per week.  The benefits of regular aerobic exercise were discussed.  Depression screen: there are no signs or vegative symptoms of depression- irritability, change in appetite, anhedonia, sadness/tearfullness.  Cognitive assessment: the patient manages all their financial and personal affairs and is actively engaged. They could relate day,date,year and events; recalled 3/3 objects at 3 minutes; performed clock-face test normally.  The following portions of the patient's history were  reviewed and updated as appropriate: allergies, current medications, past family history, past medical history,  past surgical history, past social history  and problem list.  Vision, hearing, body mass index were assessed and reviewed.   During the course of the visit the patient was educated and counseled about appropriate screening and preventive services including : fall prevention , diabetes screening, nutrition counseling, colorectal cancer screening, and recommended immunizations.           Review of Systems Review of Systems  Constitutional:  Negative for fever, chills, activity change and unexpected weight change.  HEENT:  Negative for hearing loss, ear pain, congestion, neck stiffness and postnasal drip. Negative for sore throat or swallowing problems. Negative for dental complaints.   Eyes: Negative for vision loss or change in visual acuity.  Respiratory: Negative for chest tightness and wheezing.   Cardiovascular: Negative for chest pain and palpitation. No decreased exercise tolerance Gastrointestinal: No change in bowel habit. No bloating or gas. No reflux or indigestion Genitourinary: Negative for urgency, frequency, flank pain and difficulty urinating.  Musculoskeletal: Negative for myalgias, back pain, arthralgias and gait problem.  Neurological: Negative for dizziness, tremors, weakness and headaches.  Hematological: Negative for adenopathy.  Psychiatric/Behavioral: Negative for behavioral problems and dysphoric mood.       Objective:   Physical Exam Vitals reviewed. Gen'l: well nourished, well developed white woman in no distress HEENT - [place]/AT, EACs/TMs normal, oropharynx with native dentition in good condition, no buccal or palatal lesions, posterior pharynx clear, mucous membranes moist. C&S clear, PERRLA,  fundi - normal Neck - supple, no thyromegaly Nodes- negative submental, cervical, supraclavicular regions Chest - no deformity, no CVAT Lungs - cleat  without rales, wheezes. No increased work of breathing Breast - skin normal, nipples w/o discharge, no fixed mass or lesion. No axillary adenopathy. Cardiovascular - regular rate and rhythm, quiet precordium, no murmurs, rubs or gallops, 2+ radial, DP and PT pulses Abdomen - BS+ x 4, no HSM, no guarding or rebound or tenderness Pelvic - deferred  Rectal - deferred  Extremities - no clubbing, cyanosis, edema or deformity.  Neuro - A&O x 3, CN II-XII normal, motor strength normal and equal, DTRs 2+ and symmetrical biceps, radial, and patellar tendons. Cerebellar - no tremor, no rigidity, fluid movement and normal gait. Derm - Head, neck, back, abdomen and extremities without suspicious lesions  Lab Results  Component Value Date   WBC 7.4 05/05/2011   HGB 14.4 05/05/2011   HCT 42.1 05/05/2011   PLT 227.0 05/05/2011   CHOL 164 05/05/2011   TRIG 206.0* 05/05/2011   HDL 47.80 05/05/2011   LDLDIRECT 86.8 05/05/2011   ALT 43* 05/05/2011   ALT 43* 05/05/2011   AST 44* 05/05/2011   AST 44* 05/05/2011   NA 140 05/05/2011   K 4.2 05/05/2011   CL 101 05/05/2011   CREATININE 0.9 05/05/2011   BUN 19 05/05/2011   CO2 30 05/05/2011   TSH 1.11 05/05/2011       Glucose                  95                                                                   05/05/2011        Assessment & Plan:

## 2011-05-06 DIAGNOSIS — Z0000 Encounter for general adult medical examination without abnormal findings: Secondary | ICD-10-CM | POA: Insufficient documentation

## 2011-05-06 NOTE — Assessment & Plan Note (Signed)
Good control with LDL below 130. Tolerating medication well.  Plan - continue present medications.

## 2011-05-06 NOTE — Assessment & Plan Note (Signed)
Interval medical history is unremarkable. PHysical exam is normal. Lab results are in normal range except for a minimal elevation in liver functions of no pathologic significance and mildly elevated triglycerides. She is current with breast health with a normal exam and last mammogram June 22, '12. She is current with colorectal cancer screening with last study July '11. Immunizations: Tetanus May '04; pneumonia vaccine June '08. She declines to consider shingles vaccine - concerned about allergic reaction due to "mycin" sensitivity. 12 lead EKG w/o evidence of ischemia or injury.  In summary - a very nice woman who is medically stable at this exam. She is advised to continue her health life-style, including regular exercise. She will return in 1 year or as needed.

## 2011-05-06 NOTE — Assessment & Plan Note (Signed)
Lab Results  Component Value Date   HGB 14.4 05/05/2011   Excellent hemoglobin - not anemic.

## 2011-05-06 NOTE — Assessment & Plan Note (Signed)
Moderate problem with no significant limitations in activity.  Plan - use of rub of choice           APAP use as needed as well a judiciously used NSAIDs

## 2011-05-06 NOTE — Assessment & Plan Note (Signed)
BP Readings from Last 3 Encounters:  05/05/11 148/82  05/01/10 136/88  04/26/09 132/86   Adequate control on present medical regimen, Will continue the same

## 2011-05-12 ENCOUNTER — Encounter: Payer: Self-pay | Admitting: Internal Medicine

## 2011-06-19 ENCOUNTER — Ambulatory Visit (INDEPENDENT_AMBULATORY_CARE_PROVIDER_SITE_OTHER): Payer: Medicare Other | Admitting: *Deleted

## 2011-06-19 DIAGNOSIS — Z23 Encounter for immunization: Secondary | ICD-10-CM

## 2011-10-16 ENCOUNTER — Other Ambulatory Visit: Payer: Self-pay

## 2011-10-16 MED ORDER — ATENOLOL 100 MG PO TABS: 100.0000 mg | ORAL_TABLET | Freq: Every day | ORAL | Status: DC

## 2011-10-21 ENCOUNTER — Other Ambulatory Visit: Payer: Self-pay

## 2011-10-21 MED ORDER — ATENOLOL 100 MG PO TABS: 100.0000 mg | ORAL_TABLET | Freq: Every day | ORAL | Status: DC

## 2012-01-14 ENCOUNTER — Telehealth: Payer: Self-pay

## 2012-01-14 DIAGNOSIS — M8580 Other specified disorders of bone density and structure, unspecified site: Secondary | ICD-10-CM | POA: Insufficient documentation

## 2012-01-14 NOTE — Telephone Encounter (Signed)
Pt called requesting referral to [facility] for DXA scan.

## 2012-01-14 NOTE — Telephone Encounter (Signed)
Order entered

## 2012-01-16 ENCOUNTER — Other Ambulatory Visit: Payer: Self-pay | Admitting: Internal Medicine

## 2012-01-16 DIAGNOSIS — Z1231 Encounter for screening mammogram for malignant neoplasm of breast: Secondary | ICD-10-CM

## 2012-03-08 ENCOUNTER — Ambulatory Visit
Admission: RE | Admit: 2012-03-08 | Discharge: 2012-03-08 | Disposition: A | Discharge status: Home / Self Care | Payer: Medicare Other | Source: Ambulatory Visit | Attending: Internal Medicine | Admitting: Internal Medicine

## 2012-03-08 DIAGNOSIS — Z1231 Encounter for screening mammogram for malignant neoplasm of breast: Secondary | ICD-10-CM

## 2012-03-08 DIAGNOSIS — M8580 Other specified disorders of bone density and structure, unspecified site: Secondary | ICD-10-CM

## 2012-03-23 ENCOUNTER — Encounter: Payer: Self-pay | Admitting: Internal Medicine

## 2012-03-29 ENCOUNTER — Encounter: Payer: Self-pay | Admitting: Internal Medicine

## 2012-05-05 ENCOUNTER — Other Ambulatory Visit (INDEPENDENT_AMBULATORY_CARE_PROVIDER_SITE_OTHER): Payer: Medicare Other

## 2012-05-05 ENCOUNTER — Ambulatory Visit (INDEPENDENT_AMBULATORY_CARE_PROVIDER_SITE_OTHER): Payer: Medicare Other | Admitting: Internal Medicine

## 2012-05-05 ENCOUNTER — Encounter: Payer: Self-pay | Admitting: Internal Medicine

## 2012-05-05 VITALS — BP 118/72 | HR 60 | Temp 97.6°F | Resp 16 | Wt 135.0 lb

## 2012-05-05 DIAGNOSIS — M1710 Unilateral primary osteoarthritis, unspecified knee: Secondary | ICD-10-CM

## 2012-05-05 DIAGNOSIS — I10 Essential (primary) hypertension: Secondary | ICD-10-CM

## 2012-05-05 DIAGNOSIS — D649 Anemia, unspecified: Secondary | ICD-10-CM

## 2012-05-05 DIAGNOSIS — M899 Disorder of bone, unspecified: Secondary | ICD-10-CM

## 2012-05-05 DIAGNOSIS — E785 Hyperlipidemia, unspecified: Secondary | ICD-10-CM

## 2012-05-05 DIAGNOSIS — M8580 Other specified disorders of bone density and structure, unspecified site: Secondary | ICD-10-CM

## 2012-05-05 DIAGNOSIS — IMO0002 Reserved for concepts with insufficient information to code with codable children: Secondary | ICD-10-CM

## 2012-05-05 DIAGNOSIS — Z0000 Encounter for general adult medical examination without abnormal findings: Secondary | ICD-10-CM

## 2012-05-05 LAB — COMPREHENSIVE METABOLIC PANEL
AST: 40 U/L — ABNORMAL HIGH (ref 0–37)
Alkaline Phosphatase: 115 U/L (ref 39–117)
Glucose, Bld: 87 mg/dL (ref 70–99)
Sodium: 138 mEq/L (ref 135–145)
Total Bilirubin: 1 mg/dL (ref 0.3–1.2)
Total Protein: 7.6 g/dL (ref 6.0–8.3)

## 2012-05-05 LAB — HEPATIC FUNCTION PANEL
ALT: 36 U/L — ABNORMAL HIGH (ref 0–35)
AST: 40 U/L — ABNORMAL HIGH (ref 0–37)
Albumin: 4.2 g/dL (ref 3.5–5.2)
Alkaline Phosphatase: 115 U/L (ref 39–117)
Total Bilirubin: 1 mg/dL (ref 0.3–1.2)

## 2012-05-05 LAB — LIPID PANEL
HDL: 47.3 mg/dL (ref >39.00)
Total CHOL/HDL Ratio: 3
Triglycerides: 255 mg/dL — ABNORMAL HIGH (ref 0.0–149.0)
VLDL: 51 mg/dL — ABNORMAL HIGH (ref 0.0–40.0)

## 2012-05-05 LAB — CBC WITH DIFFERENTIAL/PLATELET
Basophils Relative: 0.2 % (ref 0.0–3.0)
Eosinophils Absolute: 0.2 10*3/uL (ref 0.0–0.7)
Eosinophils Relative: 3 % (ref 0.0–5.0)
Hemoglobin: 13.6 g/dL (ref 12.0–15.0)
Lymphocytes Relative: 32.6 % (ref 12.0–46.0)
Monocytes Relative: 8.6 % (ref 3.0–12.0)
Neutro Abs: 4.2 10*3/uL (ref 1.4–7.7)
Neutrophils Relative %: 55.6 % (ref 43.0–77.0)
RBC: 4.37 Mil/uL (ref 3.87–5.11)
WBC: 7.5 10*3/uL (ref 4.5–10.5)

## 2012-05-05 LAB — TSH: TSH: 1.15 u[IU]/mL (ref 0.35–5.50)

## 2012-05-05 NOTE — Assessment & Plan Note (Signed)
Reveiwed DEXA from '07, '09, '11, '13: Spine - -1.1 improved to -0.3 L femoral neck -1.8 to -2.2 and stable over the last 3 studies  Plan  Continue dietary and supplemental calcium 1200 mg daily  Vit D 800-1,000 iu daily  Weight bearing exercise

## 2012-05-05 NOTE — Assessment & Plan Note (Addendum)
For follow-up lab with recommendations to follow.  Addendum - good control with LDL better than goal of 130 or less and HDL better than goal of 40+. Liver functions with a non-significant rise above normal.  Plan- continue present regimen

## 2012-05-05 NOTE — Assessment & Plan Note (Signed)
BP Readings from Last 3 Encounters:  05/05/12 118/72  05/05/11 148/82  05/01/10 136/88   Good control  Plan- continue present medication

## 2012-05-05 NOTE — Progress Notes (Signed)
Subjective:    Patient ID: Alexandria Mcdowell, female    DOB: 10/12/1940, 71 y.o.   MRN: [id number]  HPI The patient is here for annual Medicare wellness examination and management of other chronic and acute problems. Doing well and feeling well.    The risk factors are reflected in the social history.  The roster of all physicians providing medical care to patient - is listed in the Snapshot section of the chart.  Activities of daily living:  The patient is 100% inedpendent in all ADLs: dressing, toileting, feeding as well as independent mobility  Home safety : The patient has smoke detectors in the home. Fall - home is fall safe including grab bars in the bathroom. They wear seatbelts. No firearms at home  There is no risks for hepatitis, STDs or HIV. There is no   history of blood transfusion. They have no travel history to infectious disease endemic areas of the world.  The patient has seen their dentist in the last six month. They have seen their eye doctor in the last year. They deny any hearing difficulty and have not had audiologic testing in the last year.    They do not  have excessive sun exposure. Discussed the need for sun protection: hats, long sleeves and use of sunscreen if there is significant sun exposure.   Diet: the importance of a healthy diet is discussed. They do have a healthy diet.  The patient has a regular exercise program: [facility] , 45 min duration, 5 per week.  The benefits of regular aerobic exercise were discussed.  Depression screen: there are no signs or vegative symptoms of depression- irritability, change in appetite, anhedonia, sadness/tearfullness.  Cognitive assessment: the patient manages all their financial and personal affairs and is actively engaged.   The following portions of the patient's history were reviewed and updated as appropriate: allergies, current medications, past family history, past medical history,  past surgical history, past  social history  and problem list.  Vision, hearing, body mass index were assessed and reviewed.   During the course of the visit the patient was educated and counseled about appropriate screening and preventive services including : fall prevention , diabetes screening, nutrition counseling, colorectal cancer screening, and recommended immunizations.   Past Medical History: Last updated: 10/14/2007 Hyperlipidemia Hypertension Allergic rhinitis Anemia-NOS MIGRAINES, HX OF (ICD-V13.8)   GYNECOLOGICAL HISTORY: The patient is a gravida 4, para 3 with 1 ectopic pregnancy  Past Surgical History: Last updated: 10/14/2007 Appendectomy * ECTOPIC PREGNANCY WITH SALPINGECTOMY IN 1970 BREAST BIOPSY, HX OF (ICD-V15.9) WISDOM TEETH EXTRACTION, HX OF (ICD-V15.9)  Family History: Last updated: 11-May-2008 father-deceased @ 65: DM, Emphysema mother- deceased @ 7: CAD, colon cancer, breast cancer, HTN, Lipids Sister- lipids, hypothyroid, CVA  Social History: HSG, [employer] - elementary ed. 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 End-of-life: discussed - DNR if a hopeless or higly unlikely to survive situation. "Laymen's" guide and forms provided. Reviewed 05/12/23  Current Outpatient Prescriptions on File Prior to Visit  Medication Sig Dispense Refill  . atenolol (TENORMIN) 100 MG tablet Take 1 tablet (100 mg total) by mouth daily.  90 tablet  2  . simvastatin (ZOCOR) 40 MG tablet Take 1 tablet (40 mg total) by mouth at bedtime.  90 tablet  3       Review of Systems Constitutional:  Negative for fever, chills, activity  change and unexpected weight change.  HEENT:  Negative for hearing loss, ear pain, congestion, neck stiffness and postnasal drip. Negative for sore throat or swallowing problems. Negative for dental complaints.   Eyes: Negative for vision loss or change in visual acuity.    Respiratory: Negative for chest tightness and wheezing. Negative for DOE.   Cardiovascular: Negative for chest pain or palpitations. No decreased exercise tolerance Gastrointestinal: No change in bowel habit. No bloating or gas. No reflux or indigestion Genitourinary: Negative for urgency, frequency, flank pain and difficulty urinating.  Musculoskeletal: Negative for myalgias, back pain, arthralgias and gait problem.  Neurological: Negative for dizziness, tremors, weakness and headaches.  Hematological: Negative for adenopathy.  Psychiatric/Behavioral: Negative for behavioral problems and dysphoric mood.       Objective:   Physical Exam Filed Vitals:   05/05/12 0901  BP: 118/72  Pulse: 60  Temp: 97.6 F (36.4 C)  Resp: 16   Wt Readings from Last 3 Encounters:  05/05/12 135 lb (61.236 kg)  05/05/11 141 lb (63.957 kg)  05/01/10 139 lb (63.05 kg)   Gen'l: well nourished, well developed white Woman in no distress HEENT - [place]/AT, EACs/TMs normal, oropharynx with native dentition in good condition, no buccal or palatal lesions, posterior pharynx clear, mucous membranes moist. C&S clear, PERRLA, fundi - coiuld not visualize - early cataracts. Neck - supple, no thyromegaly Nodes- negative submental, cervical, supraclavicular regions Chest - no deformity, no CVAT Lungs - cleat without rales, wheezes. No increased work of breathing Breast -- Skin normal, nipples w/o discharge, no fixed mass or lesion, no axillary adenopathy. Cardiovascular - regular rate and rhythm, quiet precordium, no murmurs, rubs or gallops, 2+ radial, DP and PT pulses Abdomen - BS+ x 4, no HSM, no guarding or rebound or tenderness Pelvic - deferred  Rectal - deferred  Extremities - no clubbing, cyanosis, edema or deformity.  Neuro - A&O x 3, CN II-XII normal, motor strength normal and equal, DTRs 2+ and symmetrical biceps, radial, and patellar tendons. Cerebellar - no tremor, no rigidity, fluid movement and normal  gait. Derm - Head, neck, back, abdomen and extremities without suspicious lesions. Multiple scars from excision of lesions.  Lab Results  Component Value Date   WBC 7.5 05/05/2012   HGB 13.6 05/05/2012   HCT 40.8 05/05/2012   PLT 216.0 05/05/2012   GLUCOSE 87 05/05/2012   CHOL 156 05/05/2012   TRIG 255.0* 05/05/2012   HDL 47.30 05/05/2012   LDLDIRECT 66.4 05/05/2012   LDLCALC 64 05/01/2010        ALT 36* 05/05/2012   AST 40* 05/05/2012        NA 138 05/05/2012   K 4.6 05/05/2012   CL 103 05/05/2012   CREATININE 0.7 05/05/2012   BUN 17 05/05/2012   CO2 30 05/05/2012   TSH 1.15 05/05/2012          Assessment & Plan:

## 2012-05-05 NOTE — Assessment & Plan Note (Addendum)
For follow up CBC with recommendation to follow.   Hemoglobin normal. No further evaluation needed.

## 2012-05-05 NOTE — Assessment & Plan Note (Signed)
Interval history is normal. Physical exam is normal. Lab results are in normal range. She is current with colorectal and breast cancer screening. Immunizations are current and she decline shingles vaccine.  In summary - a very nice health conscious woman who is making a serious investment in her health with regular exercise and diet. She will return in 1 year or sooner as needed.

## 2012-05-05 NOTE — Assessment & Plan Note (Signed)
No limitation in activity

## 2012-06-22 ENCOUNTER — Ambulatory Visit (INDEPENDENT_AMBULATORY_CARE_PROVIDER_SITE_OTHER): Payer: Medicare Other | Admitting: *Deleted

## 2012-06-22 ENCOUNTER — Other Ambulatory Visit: Payer: Self-pay | Admitting: Internal Medicine

## 2012-06-22 DIAGNOSIS — Z23 Encounter for immunization: Secondary | ICD-10-CM

## 2012-06-22 MED ORDER — SIMVASTATIN 40 MG PO TABS: 40.0000 mg | ORAL_TABLET | Freq: Every evening | ORAL | Status: DC

## 2012-06-22 NOTE — Telephone Encounter (Signed)
Pt req refill for Simvastatin 40 mg for 90 days supply to be send to [facility]. Please call pt once its done.

## 2012-08-31 ENCOUNTER — Other Ambulatory Visit: Payer: Self-pay | Admitting: *Deleted

## 2012-08-31 MED ORDER — ATENOLOL 100 MG PO TABS: 100.0000 mg | ORAL_TABLET | Freq: Every day | ORAL | Status: DC

## 2013-01-31 ENCOUNTER — Other Ambulatory Visit: Payer: Self-pay

## 2013-01-31 DIAGNOSIS — Z1231 Encounter for screening mammogram for malignant neoplasm of breast: Secondary | ICD-10-CM

## 2013-03-09 ENCOUNTER — Ambulatory Visit
Admission: RE | Admit: 2013-03-09 | Discharge: 2013-03-09 | Disposition: A | Discharge status: Home / Self Care | Payer: Medicare Other | Source: Ambulatory Visit

## 2013-03-09 DIAGNOSIS — Z1231 Encounter for screening mammogram for malignant neoplasm of breast: Secondary | ICD-10-CM

## 2013-05-05 HISTORY — PX: CATARACT EXTRACTION: SUR2

## 2013-05-10 ENCOUNTER — Other Ambulatory Visit (INDEPENDENT_AMBULATORY_CARE_PROVIDER_SITE_OTHER): Payer: Medicare Other

## 2013-05-10 ENCOUNTER — Ambulatory Visit (INDEPENDENT_AMBULATORY_CARE_PROVIDER_SITE_OTHER): Payer: Medicare Other | Admitting: Internal Medicine

## 2013-05-10 ENCOUNTER — Encounter: Payer: Self-pay | Admitting: Internal Medicine

## 2013-05-10 VITALS — BP 114/80 | HR 48 | Temp 97.6°F | Ht 61.0 in | Wt 134.8 lb

## 2013-05-10 DIAGNOSIS — Z87898 Personal history of other specified conditions: Secondary | ICD-10-CM

## 2013-05-10 DIAGNOSIS — E785 Hyperlipidemia, unspecified: Secondary | ICD-10-CM

## 2013-05-10 DIAGNOSIS — M8580 Other specified disorders of bone density and structure, unspecified site: Secondary | ICD-10-CM

## 2013-05-10 DIAGNOSIS — Z0000 Encounter for general adult medical examination without abnormal findings: Secondary | ICD-10-CM

## 2013-05-10 DIAGNOSIS — I10 Essential (primary) hypertension: Secondary | ICD-10-CM

## 2013-05-10 DIAGNOSIS — Z23 Encounter for immunization: Secondary | ICD-10-CM

## 2013-05-10 DIAGNOSIS — D649 Anemia, unspecified: Secondary | ICD-10-CM

## 2013-05-10 DIAGNOSIS — M899 Disorder of bone, unspecified: Secondary | ICD-10-CM

## 2013-05-10 LAB — HEPATIC FUNCTION PANEL
AST: 32 U/L (ref 0–37)
Albumin: 4.2 g/dL (ref 3.5–5.2)
Alkaline Phosphatase: 112 U/L (ref 39–117)
Total Protein: 7.8 g/dL (ref 6.0–8.3)

## 2013-05-10 LAB — COMPREHENSIVE METABOLIC PANEL
ALT: 30 U/L (ref 0–35)
AST: 32 U/L (ref 0–37)
Creatinine, Ser: 0.8 mg/dL (ref 0.4–1.2)
Sodium: 139 mEq/L (ref 135–145)
Total Bilirubin: 0.9 mg/dL (ref 0.3–1.2)

## 2013-05-10 LAB — LIPID PANEL
HDL: 45.2 mg/dL (ref >39.00)
Total CHOL/HDL Ratio: 4
VLDL: 44.2 mg/dL — ABNORMAL HIGH (ref 0.0–40.0)

## 2013-05-10 LAB — LDL CHOLESTEROL, DIRECT: Direct LDL: 87.3 mg/dL

## 2013-05-10 NOTE — Patient Instructions (Addendum)
Thanks for coming in to see [place].  You are doing fine. All up to date on health maintenance and will get a tetanus booster today.,  Labs - for today and results will be posted to MyChart  If all goes well I will see you next year.

## 2013-05-10 NOTE — Progress Notes (Signed)
Subjective:    Patient ID: Alexandria Mcdowell, female    DOB: [birth year], 72 y.o.   MRN: [id number]  HPI The patient is here for annual Medicare wellness examination and management of other chronic and acute problems.  She has had cataract extraction with IOL Thursday, Aug 21st. She is doing well.    The risk factors are reflected in the social history.  The roster of all physicians providing medical care to patient - is listed in the Snapshot section of the chart.  Activities of daily living:  The patient is 100% inedpendent in all ADLs: dressing, toileting, feeding as well as independent mobility  Home safety : The patient has smoke detectors in the home. Falls - no falls, home is fall safe.  They wear seatbelts. No firearms at home. There is no violence in the home.   There is no risks for hepatitis, STDs or HIV. There is history of blood transfusion 1970 - ectopic pregnancy. They have no travel history to infectious disease endemic areas of the world.  The patient has seen their dentist in the last six month. They have seen their eye doctor in the last year. They deny any hearing difficulty and have not had audiologic testing in the last year.    They do not  have excessive sun exposure. Discussed the need for sun protection: hats, long sleeves and use of sunscreen if there is significant sun exposure.   Diet: the importance of a healthy diet is discussed. They do have a healthy diet.  The patient has a regular exercise program: cardio/weights , 40 min duration, 5 per week.  The benefits of regular aerobic exercise were discussed.  Depression screen: there are no signs or vegative symptoms of depression- irritability, change in appetite, anhedonia, sadness/tearfullness.  Cognitive assessment: the patient manages all their financial and personal affairs and is actively engaged.   The following portions of the patient's history were reviewed and updated as appropriate: allergies,  current medications, past family history, past medical history,  past surgical history, past social history  and problem list.  Vision, hearing, body mass index were assessed and reviewed.   Past Medical History  Diagnosis Date  . Hyperlipidemia   . Hypertension   . Anemia   . Allergy   . Migraine    Past Surgical History  Procedure Laterality Date  . Cataract extraction  05/05/13    right eye   . Wisdom tooth extraction    . Breast biopsy    . Appendectomy    . Colonoscopy  2006   Family History  Problem Relation Age of Onset  . Hypertension Mother   . Hyperlipidemia Mother   . Cancer Mother     breast/colon  . Heart disease Mother   . Diabetes Father   . Emphysema Father   . Hyperlipidemia Sister   . Hypothyroidism Sister   . CVA Sister    History   Social History  . Marital Status: Married    Spouse Name: N/A    Number of Children: N/A  . Years of Education: N/A   Occupational History  . Not on file.   Social History Main Topics  . Smoking status: Never Smoker   . Smokeless tobacco: Not on file  . Alcohol Use: No  . Drug Use: No  . Sexual Activity: Not on file   Other Topics Concern  . 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 Laymen's guide and forms provided.    Reviewed Aug '11    Current Outpatient Prescriptions on File Prior to Visit  Medication Sig Dispense Refill  . atenolol (TENORMIN) 100 MG tablet Take 1 tablet (100 mg total) by mouth daily.  90 tablet  2  . simvastatin (ZOCOR) 40 MG tablet Take 1 tablet (40 mg total) by mouth every evening.  90 tablet  3   No current facility-administered medications on file prior to visit.     During the course of the visit the patient was  educated and counseled about appropriate screening and preventive services including : fall prevention , diabetes screening, nutrition counseling, colorectal cancer screening, and recommended immunizations.    Review of Systems Constitutional:  Negative for fever, chills, activity change and unexpected weight change.  HEENT:  Negative for hearing loss, ear pain, congestion, neck stiffness and postnasal drip. Negative for sore throat or swallowing problems. Negative for dental complaints.   Eyes: Negative for vision loss or change in visual acuity.  Respiratory: Negative for chest tightness and wheezing. Negative for DOE.   Cardiovascular: Negative for chest pain or palpitations. No decreased exercise tolerance Gastrointestinal: No change in bowel habit. No bloating or gas. No reflux or indigestion Genitourinary: Negative for urgency, frequency, flank pain and difficulty urinating.  Musculoskeletal: Negative for myalgias, back pain, arthralgias and gait problem.  Neurological: Negative for dizziness, tremors, weakness and headaches.  Hematological: Negative for adenopathy.  Psychiatric/Behavioral: Negative for behavioral problems and dysphoric mood.       Objective:   Physical Exam Filed Vitals:   05/10/13 0841  BP: 114/80  Pulse: 48  Temp: 97.6 F (36.4 C)   Wt Readings from Last 3 Encounters:  05/10/13 134 lb 12.8 oz (61.145 kg)  05/05/12 135 lb (61.236 kg)  05/05/11 141 lb (63.957 kg)   Gen'l: well nourished, well developed white Woman in no distress HEENT - [place]/AT, EACs/TMs normal, oropharynx with native dentition in good condition, no buccal or palatal lesions, posterior pharynx clear, mucous membranes moist. C&S clear, PERRLA, fundi - normal Neck - supple, no thyromegaly Nodes- negative submental, cervical, supraclavicular regions Chest - no deformity, no CVAT Lungs - clear without rales, wheezes. No increased work of breathing Breast - - Skin normal, nipples w/o discharge,  no fixed mass or lesion, no axillary adenopathy. Cardiovascular - regular rate and rhythm, quiet precordium, no murmurs, rubs or gallops, 2+ radial, 1+DP and 2+ PT pulses Abdomen - BS+ x 4, no HSM, no guarding or rebound or tenderness Pelvic - deferred  Rectal - deferred  Extremities - no clubbing, cyanosis, edema or deformity.  Neuro - A&O x 3, CN II-XII normal, motor strength normal and equal, DTRs 2+ and symmetrical biceps, radial, and patellar tendons. Cerebellar - no tremor, no rigidity, fluid movement and normal gait. Derm - Head, neck, back, abdomen and extremities without suspicious lesions  Recent Results (from the past 2160 hour(s))  HEPATIC FUNCTION PANEL     Status: None   Collection Time    05/10/13  9:48 AM      Result Value Range   Total Bilirubin 0.9  0.3 - 1.2 mg/dL   Bilirubin, Direct 0.1  0.0 - 0.3 mg/dL   Alkaline Phosphatase 112  39 - 117 U/L   AST 32  0 - 37 U/L   ALT 30  0 - 35 U/L   Total Protein 7.8  6.0 - 8.3 g/dL   Albumin 4.2  3.5 - 5.2 g/dL  COMPREHENSIVE METABOLIC PANEL     Status: None   Collection Time    05/10/13  9:48 AM      Result Value Range   Sodium 139  135 - 145 mEq/L   Potassium 4.2  3.5 - 5.1 mEq/L   Chloride 101  96 - 112 mEq/L   CO2 30  19 - 32 mEq/L   Glucose, Bld 91  70 - 99 mg/dL   BUN 20  6 - 23 mg/dL   Creatinine, Ser 0.8  0.4 - 1.2 mg/dL   Total Bilirubin 0.9  0.3 - 1.2 mg/dL   Alkaline Phosphatase 112  39 - 117 U/L   AST 32  0 - 37 U/L   ALT 30  0 - 35 U/L   Total Protein 7.8  6.0 - 8.3 g/dL   Albumin 4.2  3.5 - 5.2 g/dL   Calcium 95.2  8.4 - 84.1 mg/dL   GFR 32.44  >01.02 mL/min  LIPID PANEL     Status: Abnormal   Collection Time    05/10/13  9:48 AM      Result Value Range   Cholesterol 171  0 - 200 mg/dL   Comment: ATP III Classification       Desirable:  < 200 mg/dL               Borderline High:  200 - 239 mg/dL          High:  > = 725 mg/dL   Triglycerides 366.4 (*) 0.0 - 149.0 mg/dL   Comment: Normal:  <403  mg/dLBorderline High:  150 - 199 mg/dL   HDL 47.42  >59.56 mg/dL   VLDL 38.7 (*) 0.0 - 56.4 mg/dL   Total CHOL/HDL Ratio 4     Comment:                Men          Women1/2 Average Risk     3.4          3.3Average Risk          5.0          4.42X Average Risk          9.6          7.13X Average Risk          15.0          11.0                      LDL CHOLESTEROL, DIRECT     Status: None   Collection Time    05/10/13  9:48 AM      Result Value Range   Direct LDL 87.3     Comment: Optimal:  <100 mg/dLNear or Above Optimal:  100-129 mg/dLBorderline High:  130-159 mg/dLHigh:  160-189 mg/dLVery High:  >190 mg/dL          Assessment & Plan:

## 2013-05-12 NOTE — Assessment & Plan Note (Signed)
No evidence of continued anemia

## 2013-05-12 NOTE — Assessment & Plan Note (Signed)
Interval history notable for cataract surgery otherwise benign. Physical exam, limited, normal. Lab results are in normal range. She is current with colorectal and breast cancer screening. Immunizations are current except she declines shingles vaccine.  In summary  a nice woman who is medically stable. She will return in 1 year or sooner as needed.

## 2013-05-12 NOTE — Assessment & Plan Note (Signed)
Decrease frequency to rare.

## 2013-05-12 NOTE — Assessment & Plan Note (Signed)
Lab reveals excellent control with LDL better than goal of 100 or less, HDL also better than goal. Liver functions normal.  Plan Continue present regimen

## 2013-05-12 NOTE — Assessment & Plan Note (Signed)
BP Readings from Last 3 Encounters:  05/10/13 114/80  05/05/12 118/72  05/05/11 148/82   Excellent control on present regimen. No changes needed.

## 2013-05-12 NOTE — Assessment & Plan Note (Signed)
Very stable. Due for DEXA in '15

## 2013-05-17 ENCOUNTER — Ambulatory Visit (INDEPENDENT_AMBULATORY_CARE_PROVIDER_SITE_OTHER): Payer: Medicare Other

## 2013-05-17 DIAGNOSIS — Z23 Encounter for immunization: Secondary | ICD-10-CM

## 2013-06-20 ENCOUNTER — Other Ambulatory Visit: Payer: Self-pay

## 2013-06-20 MED ORDER — ATENOLOL 100 MG PO TABS: 100.0000 mg | ORAL_TABLET | Freq: Every day | ORAL | Status: DC

## 2013-10-25 ENCOUNTER — Encounter: Payer: Self-pay | Admitting: Internal Medicine

## 2013-10-25 MED ORDER — SIMVASTATIN 40 MG PO TABS: 40.0000 mg | ORAL_TABLET | Freq: Every evening | ORAL | Status: DC

## 2013-10-25 MED ORDER — ATENOLOL 100 MG PO TABS: 100.0000 mg | ORAL_TABLET | Freq: Every day | ORAL | Status: DC

## 2013-10-26 ENCOUNTER — Encounter: Payer: Self-pay | Admitting: Internal Medicine

## 2013-10-26 ENCOUNTER — Ambulatory Visit (INDEPENDENT_AMBULATORY_CARE_PROVIDER_SITE_OTHER): Payer: Medicare HMO | Admitting: Internal Medicine

## 2013-10-26 VITALS — BP 140/78 | HR 52 | Temp 97.0°F | Wt 132.0 lb

## 2013-10-26 DIAGNOSIS — M25562 Pain in left knee: Secondary | ICD-10-CM

## 2013-10-26 DIAGNOSIS — M25569 Pain in unspecified knee: Secondary | ICD-10-CM

## 2013-10-26 NOTE — Progress Notes (Signed)
   Subjective:    Patient ID: Alexandria Mcdowell, female    DOB: 09/17/1940, 73 y.o.   MRN: [id number]  HPI Mrs. Alexandria Mcdowell presents for left knee pain for several weeks. She feels it is worse when it is cold but she has been able to do Zumba. No click, no lock, no collapse. She does get relief with NSAIDs.   Past Medical History  Diagnosis Date  . Hyperlipidemia   . Hypertension   . Anemia   . Allergy   . Migraine    Past Surgical History  Procedure Laterality Date  . Cataract extraction  05/05/13    right eye   . Wisdom tooth extraction    . Breast biopsy    . Appendectomy    . Colonoscopy  2006   Family History  Problem Relation Age of Onset  . Hypertension Mother   . Hyperlipidemia Mother   . Cancer Mother     breast/colon  . Heart disease Mother   . Diabetes Father   . Emphysema Father   . Hyperlipidemia Sister   . Hypothyroidism Sister   . CVA Sister    History   Social History  . Marital Status: Married    Spouse Name: N/A    Number of Children: N/A  . Years of Education: N/A   Occupational History  . Not on file.   Social History Main Topics  . Smoking status: Never Smoker   . Smokeless tobacco: Not on file  . Alcohol Use: No  . Drug Use: No  . Sexual Activity: Not on file   Other Topics Concern  . 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 Laymen's guide and forms provided.    Reviewed Aug '11    Current Outpatient Prescriptions on File Prior to Visit  Medication Sig Dispense Refill  . atenolol (TENORMIN) 100 MG tablet Take 1 tablet (100 mg total) by mouth daily.  90 tablet  3  . simvastatin (ZOCOR) 40 MG tablet Take 1 tablet (40 mg total) by mouth every evening.  90 tablet  3   No current  facility-administered medications on file prior to visit.      Review of Systems System review is negative for any constitutional, cardiac, pulmonary, GI or neuro symptoms or complaints other than as described in the HPI.       Objective:   Physical Exam Filed Vitals:   10/26/13 1001  BP: 140/78  Pulse: 52  Temp: 97 F (36.1 C)   gen'l- WNWD woman in no distress Cor- RRR PUlm - normal respirations. MSK - tender at the medial aspect of the knee - tendonous attachment.         Assessment & Plan:  Tendonitis - attachment of the gracilis and sartorious muscles Plan Warm up before exercise  Heat - heat patches are good or warm compresses when painful  Rub of choice, e.g. Aspercreme, icy-hot,etc

## 2013-10-26 NOTE — Progress Notes (Signed)
Pre visit review using our clinic review tool, if applicable. No additional management support is needed unless otherwise documented below in the visit note. 

## 2013-10-26 NOTE — Patient Instructions (Signed)
Tendonitis - attachment of the gracilis and sartorious muscles Plan Warm up before exercise  Heat - heat patches are good or warm compresses when painful  Rub of choice, e.g. Aspercreme, icy-hot,etc  Immunizations - please reconsider the shingles vaccine - shingles is a bad thing to get and you have a 1 in 4 chance of getting this. Also, consider having Prevnar 13 - a companion pneumonia vaccine.  Bone density - normal lumbar spine the there is loss at the femoral neck, the connect between the ball and the long bone at the hip, in the osteopenia, close to osteoporosis, range.   Plan is continued exercise, 1200 mg calcium (diet and supplement together) daily and vitamin D 1,000 iu daily. You can schedule a follow up DEXA scan (bone density) at your convenience.

## 2013-11-29 ENCOUNTER — Telehealth: Payer: Self-pay | Admitting: Internal Medicine

## 2013-11-29 MED ORDER — ATENOLOL 100 MG PO TABS: 100.0000 mg | ORAL_TABLET | Freq: Every day | ORAL | Status: DC

## 2013-11-29 MED ORDER — SIMVASTATIN 40 MG PO TABS: 40.0000 mg | ORAL_TABLET | Freq: Every evening | ORAL | Status: DC

## 2013-11-29 NOTE — Telephone Encounter (Signed)
Notified pt med has been resent to [facility]...Alexandria Mcdowell

## 2013-11-29 NOTE — Telephone Encounter (Signed)
Patient asks that we re-send her atenolol and simvastatin rx's to [facility]. Patient says that they are telling her they did not receive prescriptions sent back in February. Please advise.

## 2014-02-07 ENCOUNTER — Ambulatory Visit (INDEPENDENT_AMBULATORY_CARE_PROVIDER_SITE_OTHER): Payer: Medicare HMO | Admitting: Family Medicine

## 2014-02-07 ENCOUNTER — Encounter: Payer: Self-pay | Admitting: Family Medicine

## 2014-02-07 VITALS — BP 159/74 | HR 56 | Temp 97.9°F | Ht 60.63 in | Wt 127.2 lb

## 2014-02-07 DIAGNOSIS — M8580 Other specified disorders of bone density and structure, unspecified site: Secondary | ICD-10-CM

## 2014-02-07 DIAGNOSIS — E785 Hyperlipidemia, unspecified: Secondary | ICD-10-CM

## 2014-02-07 DIAGNOSIS — D649 Anemia, unspecified: Secondary | ICD-10-CM

## 2014-02-07 DIAGNOSIS — I10 Essential (primary) hypertension: Secondary | ICD-10-CM

## 2014-02-07 DIAGNOSIS — M899 Disorder of bone, unspecified: Secondary | ICD-10-CM

## 2014-02-07 DIAGNOSIS — M949 Disorder of cartilage, unspecified: Secondary | ICD-10-CM

## 2014-02-07 NOTE — Assessment & Plan Note (Signed)
Previously well controlled. Follow at home.

## 2014-02-07 NOTE — Progress Notes (Signed)
   Subjective:    Patient ID: Alexandria Mcdowell, female    DOB: 1940-10-20, 73 y.o.   MRN: [id number]  HPI  73 year old female presents to establish care. She has previously been seen by Dr. Linda Hedges. Her last CPX was in  Aug. of 2014.  Hypertension:   Previously well controlled, may be elevated by seeing new MD today. Stable on atenolol.  Using medication without problems or lightheadedness: None Chest pain with exertion:None Edema:None Short of breath:NOne Average home BPs: not checking. Other issues:  Allergies: well controlled with nasal saline irrigation.  Hypercholesterolemia,  She is interested in decreasing or stopping simvastatin. She feels muscle weak and fatigue.  Goal LDL < 130.   Lab Results  Component Value Date   CHOL 171 05/10/2013   HDL 45.20 05/10/2013   LDLCALC 64 05/01/2010   LDLDIRECT 87.3 05/10/2013   TRIG 221.0* 05/10/2013   CHOLHDL 4 05/10/2013    Review of Systems  Constitutional: Positive for fatigue. Negative for fever and unexpected weight change.  HENT: Negative for congestion, ear pain, sinus pressure, sneezing, sore throat and trouble swallowing.   Eyes: Negative for pain and itching.  Respiratory: Negative for cough, shortness of breath and wheezing.   Cardiovascular: Negative for chest pain, palpitations and leg swelling.  Gastrointestinal: Negative for nausea, abdominal pain, diarrhea, constipation and blood in stool.  Genitourinary: Negative for dysuria, hematuria, vaginal discharge, difficulty urinating and menstrual problem.  Skin: Negative for rash.  Neurological: Negative for syncope, weakness, light-headedness, numbness and headaches.  Psychiatric/Behavioral: Negative for confusion and dysphoric mood. The patient is not nervous/anxious.        Objective:   Physical Exam  Constitutional: Vital signs are normal. She appears well-developed and well-nourished. She is cooperative.  Non-toxic appearance. She does not appear ill. No distress.    HENT:  Head: Normocephalic.  Right Ear: Hearing, tympanic membrane, external ear and ear canal normal. Tympanic membrane is not erythematous, not retracted and not bulging.  Left Ear: Hearing, tympanic membrane, external ear and ear canal normal. Tympanic membrane is not erythematous, not retracted and not bulging.  Nose: No mucosal edema or rhinorrhea. Right sinus exhibits no maxillary sinus tenderness and no frontal sinus tenderness. Left sinus exhibits no maxillary sinus tenderness and no frontal sinus tenderness.  Mouth/Throat: Uvula is midline, oropharynx is clear and moist and mucous membranes are normal.  Eyes: Conjunctivae, EOM and lids are normal. Pupils are equal, round, and reactive to light. Lids are everted and swept, no foreign bodies found.  Neck: Trachea normal and normal range of motion. Neck supple. Carotid bruit is not present. No mass and no thyromegaly present.  Cardiovascular: Normal rate, regular rhythm, S1 normal, S2 normal, normal heart sounds, intact distal pulses and normal pulses.  Exam reveals no gallop and no friction rub.   No murmur heard. Pulmonary/Chest: Effort normal and breath sounds normal. Not tachypneic. No respiratory distress. She has no decreased breath sounds. She has no wheezes. She has no rhonchi. She has no rales.  Abdominal: Soft. Normal appearance and bowel sounds are normal. There is no tenderness.  Neurological: She is alert.  Skin: Skin is warm, dry and intact. No rash noted.  Psychiatric: Her speech is normal and behavior is normal. Judgment and thought content normal. Her mood appears not anxious. Cognition and memory are normal. She does not exhibit a depressed mood.          Assessment & Plan:

## 2014-02-07 NOTE — Progress Notes (Signed)
Pre visit review using our clinic review tool, if applicable. No additional management support is needed unless otherwise documented below in the visit note. 

## 2014-02-07 NOTE — Patient Instructions (Addendum)
Decrease simvastatin to 40 mg three days a week.  Schedule medicare wellness with fasting labs prior in 04/2014. Look into Prevnar vaccine. Follow blood pressure, call if 3 measurements greater than 140/90.

## 2014-02-07 NOTE — Assessment & Plan Note (Signed)
Due for DEXA at 2015 CPX.

## 2014-02-07 NOTE — Assessment & Plan Note (Signed)
?   SE to simvastatin . Decrease to 40 mg three times a week and recheck in 3 months.

## 2014-02-15 ENCOUNTER — Telehealth: Payer: Self-pay | Admitting: Family Medicine

## 2014-02-15 ENCOUNTER — Other Ambulatory Visit: Payer: Self-pay

## 2014-02-15 DIAGNOSIS — E2839 Other primary ovarian failure: Secondary | ICD-10-CM

## 2014-02-15 DIAGNOSIS — Z1231 Encounter for screening mammogram for malignant neoplasm of breast: Secondary | ICD-10-CM

## 2014-02-15 NOTE — Telephone Encounter (Signed)
Bone Density ordered in Epic.

## 2014-02-15 NOTE — Telephone Encounter (Signed)
Pt called requesting bone density referral to [facility]. Mammogram is scheduled for 03/13/14 @ 9am.

## 2014-02-20 ENCOUNTER — Other Ambulatory Visit: Payer: Self-pay | Admitting: Family Medicine

## 2014-02-20 DIAGNOSIS — Z1231 Encounter for screening mammogram for malignant neoplasm of breast: Secondary | ICD-10-CM

## 2014-03-03 ENCOUNTER — Ambulatory Visit: Payer: Medicare HMO | Admitting: Family Medicine

## 2014-03-13 ENCOUNTER — Ambulatory Visit: Payer: Medicare HMO

## 2014-03-13 ENCOUNTER — Other Ambulatory Visit: Payer: Medicare HMO

## 2014-03-14 ENCOUNTER — Encounter: Payer: Self-pay | Admitting: Family Medicine

## 2014-03-16 ENCOUNTER — Encounter: Payer: Self-pay | Admitting: Family Medicine

## 2014-05-05 ENCOUNTER — Other Ambulatory Visit (INDEPENDENT_AMBULATORY_CARE_PROVIDER_SITE_OTHER): Payer: Medicare HMO

## 2014-05-05 ENCOUNTER — Ambulatory Visit: Payer: Commercial Managed Care - HMO

## 2014-05-05 DIAGNOSIS — M949 Disorder of cartilage, unspecified: Principal | ICD-10-CM

## 2014-05-05 DIAGNOSIS — D649 Anemia, unspecified: Secondary | ICD-10-CM

## 2014-05-05 DIAGNOSIS — I10 Essential (primary) hypertension: Secondary | ICD-10-CM

## 2014-05-05 DIAGNOSIS — M899 Disorder of bone, unspecified: Secondary | ICD-10-CM

## 2014-05-05 DIAGNOSIS — M8580 Other specified disorders of bone density and structure, unspecified site: Secondary | ICD-10-CM

## 2014-05-05 DIAGNOSIS — E785 Hyperlipidemia, unspecified: Secondary | ICD-10-CM

## 2014-05-05 LAB — COMPREHENSIVE METABOLIC PANEL
ALT: 40 U/L — AB (ref 0–35)
AST: 45 U/L — AB (ref 0–37)
Albumin: 4.2 g/dL (ref 3.5–5.2)
Alkaline Phosphatase: 122 U/L — ABNORMAL HIGH (ref 39–117)
BILIRUBIN TOTAL: 0.9 mg/dL (ref 0.2–1.2)
BUN: 19 mg/dL (ref 6–23)
CALCIUM: 9.9 mg/dL (ref 8.4–10.5)
CO2: 31 mEq/L (ref 19–32)
CREATININE: 0.9 mg/dL (ref 0.4–1.2)
Chloride: 101 mEq/L (ref 96–112)
GFR: 65.11 mL/min (ref >60.00)
Glucose, Bld: 91 mg/dL (ref 70–99)
Potassium: 4.3 mEq/L (ref 3.5–5.1)
Sodium: 139 mEq/L (ref 135–145)
Total Protein: 7.5 g/dL (ref 6.0–8.3)

## 2014-05-05 LAB — CBC WITH DIFFERENTIAL/PLATELET
BASOS PCT: 0.7 % (ref 0.0–3.0)
Basophils Absolute: 0 10*3/uL (ref 0.0–0.1)
EOS ABS: 0.2 10*3/uL (ref 0.0–0.7)
EOS PCT: 3.4 % (ref 0.0–5.0)
HCT: 43 % (ref 36.0–46.0)
HEMOGLOBIN: 14.6 g/dL (ref 12.0–15.0)
LYMPHS PCT: 34.6 % (ref 12.0–46.0)
Lymphs Abs: 2.2 10*3/uL (ref 0.7–4.0)
MCHC: 34 g/dL (ref 30.0–36.0)
MCV: 91.5 fl (ref 78.0–100.0)
MONO ABS: 0.6 10*3/uL (ref 0.1–1.0)
Monocytes Relative: 9.4 % (ref 3.0–12.0)
NEUTROS ABS: 3.3 10*3/uL (ref 1.4–7.7)
NEUTROS PCT: 51.9 % (ref 43.0–77.0)
Platelets: 230 10*3/uL (ref 150.0–400.0)
RBC: 4.7 Mil/uL (ref 3.87–5.11)
RDW: 13.3 % (ref 11.5–15.5)
WBC: 6.4 10*3/uL (ref 4.0–10.5)

## 2014-05-05 LAB — LIPID PANEL
CHOL/HDL RATIO: 3
Cholesterol: 177 mg/dL (ref 0–200)
HDL: 51.7 mg/dL (ref >39.00)
LDL CALC: 91 mg/dL (ref 0–99)
NONHDL: 125.3
TRIGLYCERIDES: 174 mg/dL — AB (ref 0.0–149.0)
VLDL: 34.8 mg/dL (ref 0.0–40.0)

## 2014-05-06 LAB — TSH: TSH: 0.65 u[IU]/mL (ref 0.35–4.50)

## 2014-05-06 LAB — VITAMIN D 25 HYDROXY (VIT D DEFICIENCY, FRACTURES): VITD: 42.02 ng/mL (ref 30.00–100.00)

## 2014-05-12 ENCOUNTER — Encounter: Payer: Self-pay | Admitting: Family Medicine

## 2014-05-12 ENCOUNTER — Ambulatory Visit (INDEPENDENT_AMBULATORY_CARE_PROVIDER_SITE_OTHER): Payer: Commercial Managed Care - HMO | Admitting: Family Medicine

## 2014-05-12 VITALS — BP 130/76 | HR 52 | Temp 98.0°F | Ht 60.5 in | Wt 125.2 lb

## 2014-05-12 DIAGNOSIS — R945 Abnormal results of liver function studies: Secondary | ICD-10-CM

## 2014-05-12 DIAGNOSIS — Z23 Encounter for immunization: Secondary | ICD-10-CM

## 2014-05-12 DIAGNOSIS — Z0000 Encounter for general adult medical examination without abnormal findings: Secondary | ICD-10-CM

## 2014-05-12 DIAGNOSIS — R7989 Other specified abnormal findings of blood chemistry: Secondary | ICD-10-CM | POA: Insufficient documentation

## 2014-05-12 LAB — HEPATIC FUNCTION PANEL
ALT: 33 U/L (ref 0–35)
AST: 40 U/L — ABNORMAL HIGH (ref 0–37)
Albumin: 4.1 g/dL (ref 3.5–5.2)
Alkaline Phosphatase: 129 U/L — ABNORMAL HIGH (ref 39–117)
Bilirubin, Direct: 0.1 mg/dL (ref 0.0–0.3)
Total Bilirubin: 1 mg/dL (ref 0.2–1.2)
Total Protein: 7.7 g/dL (ref 6.0–8.3)

## 2014-05-12 NOTE — Patient Instructions (Addendum)
Stop  at lab on way out. Consider prevnar vaccine. Continue working on healthy eating and regular exercise.

## 2014-05-12 NOTE — Progress Notes (Signed)
Pre visit review using our clinic review tool, if applicable. No additional management support is needed unless otherwise documented below in the visit note. 

## 2014-05-12 NOTE — Progress Notes (Signed)
Subjective:    Patient ID: Alexandria Mcdowell, female    DOB: 1941/04/27, 73 y.o.   MRN: [id number]  HPI   73 year old female presents for medicare wellness.  I have personally reviewed the Medicare Annual Wellness questionnaire and have noted 1. The patient's medical and social history 2. Their use of alcohol, tobacco or illicit drugs 3. Their current medications and supplements 4. The patient's functional ability including ADL's, fall risks, home safety risks and hearing or visual             impairment. 5. Diet and physical activities 6. Evidence for depression or mood disorders The patients weight, height, BMI and visual acuity have been recorded in the chart I have made referrals, counseling and provided education to the patient based review of the above and I have provided the pt with a written personalized care plan for preventive services.  Doing well overall.  Hypertension:  Well controlled on atenolol BP Readings from Last 3 Encounters:  05/12/14 130/76  02/07/14 159/74  10/26/13 140/78  Using medication without problems or lightheadedness: None Chest pain with exertion:None Edema:None Short of breath:None Average home BPs: 130/70s Other issues:  Elevated Cholesterol:LDL at goal < 130 on simvastatin. Lab Results  Component Value Date   CHOL 177 05/05/2014   HDL 51.70 05/05/2014   LDLCALC 91 05/05/2014   LDLDIRECT 87.3 05/10/2013   TRIG 174.0* 05/05/2014   CHOLHDL 3 05/05/2014  Using medications without problems: None Muscle aches: None Diet compliance: Moderate, on diabetic diet since husband dx in 09/2013 Exercise:  5 days a week, zumba and weights and treadmill Other complaints:  Elevated LFTs: nml last year but elevated previously.  No family history of liver issues. No tylenol use,  No ETOH. Transfusion in 1970. No IV drugs.      Review of Systems  Constitutional: Negative for fever and fatigue.  HENT: Negative for ear pain.   Eyes: Negative for pain.    Respiratory: Negative for chest tightness and shortness of breath.   Cardiovascular: Negative for chest pain, palpitations and leg swelling.  Gastrointestinal: Negative for abdominal pain.  Genitourinary: Negative for dysuria.   History   Social History  . Marital Status: Married    Spouse Name: N/A    Number of Children: N/A  . Years of Education: N/A   Social History Main Topics  . Smoking status: Never Smoker   . Smokeless tobacco: Never Used  . Alcohol Use: No  . Drug Use: No  . Sexual Activity: None   Other Topics Concern  . 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 Laymen's guide and forms provided.    Reviewed Aug '15      Exercsie:5 days a week, treadmill   Diet: healthy, weight watchers.       Objective:   Physical Exam  Constitutional: Vital signs are normal. She appears well-developed and well-nourished. She is cooperative.  Non-toxic appearance. She does not appear ill. No distress.  HENT:  Head: Normocephalic.  Right Ear: Hearing, tympanic membrane, external ear and ear canal normal.  Left Ear: Hearing, tympanic membrane, external ear and ear canal normal.  Nose: Nose normal.  Eyes: Conjunctivae, EOM and  lids are normal. Pupils are equal, round, and reactive to light. Lids are everted and swept, no foreign bodies found.  Neck: Trachea normal and normal range of motion. Neck supple. Carotid bruit is not present. No mass and no thyromegaly present.  Cardiovascular: Normal rate, regular rhythm, S1 normal, S2 normal, normal heart sounds and intact distal pulses.  Exam reveals no gallop.   No murmur heard. Pulmonary/Chest: Effort normal and breath sounds normal. No respiratory distress. She has no wheezes. She has  no rhonchi. She has no rales.  Abdominal: Soft. Normal appearance and bowel sounds are normal. She exhibits no distension, no fluid wave, no abdominal bruit and no mass. There is no hepatosplenomegaly. There is no tenderness. There is no rebound, no guarding and no CVA tenderness. No hernia.  Genitourinary: Vagina normal and uterus normal. No breast swelling, tenderness, discharge or bleeding. Pelvic exam was performed with patient supine. There is no rash, tenderness or lesion on the right labia. There is no rash, tenderness or lesion on the left labia. Uterus is not enlarged and not tender. Cervix exhibits no motion tenderness, no discharge and no friability. Right adnexum displays no mass, no tenderness and no fullness. Left adnexum displays no mass, no tenderness and no fullness.  Lymphadenopathy:    She has no cervical adenopathy.    She has no axillary adenopathy.  Neurological: She is alert. She has normal strength. No cranial nerve deficit or sensory deficit.  Skin: Skin is warm, dry and intact. No rash noted.  Psychiatric: Her speech is normal and behavior is normal. Judgment normal. Her mood appears not anxious. Cognition and memory are normal. She does not exhibit a depressed mood.          Assessment & Plan:  The patient's preventative maintenance and recommended screening tests for an annual wellness exam were reviewed in full today. Brought up to date unless services declined.  Counselled on the importance of diet, exercise, and its role in overall health and mortality. The patient's FH and SH was reviewed, including their home life, tobacco status, and drug and alcohol status.   Vaccines:uptodate with Td, pneumovax. Due for shingles, prevnar and flu. She refuses shingles vac , given flu and will consider prevnar. Mammo: nml 02/2014 Colon:Dr Amedeo Plenty, 2011, repeat in 5 years. DEXA: stable osteopenia 02/2014  PAP/DVE : not indicated, no family hx of uterine ovarian cancer. Will plan  every 2 year..  No symptoms.

## 2014-05-13 LAB — HEPATITIS PANEL, ACUTE
HCV Ab: NEGATIVE
Hep A IgM: NONREACTIVE
Hep B C IgM: NONREACTIVE
Hepatitis B Surface Ag: NEGATIVE

## 2014-06-07 ENCOUNTER — Emergency Department (HOSPITAL_COMMUNITY)
Admission: EM | Admit: 2014-06-07 | Discharge: 2014-06-07 | Disposition: A | Discharge status: Home / Self Care | Payer: Medicare HMO | Attending: Emergency Medicine | Admitting: Emergency Medicine

## 2014-06-07 ENCOUNTER — Encounter (HOSPITAL_COMMUNITY): Payer: Self-pay | Admitting: Emergency Medicine

## 2014-06-07 ENCOUNTER — Emergency Department (HOSPITAL_COMMUNITY): Payer: Medicare HMO

## 2014-06-07 DIAGNOSIS — Z880 Allergy status to penicillin: Secondary | ICD-10-CM | POA: Diagnosis not present

## 2014-06-07 DIAGNOSIS — G43909 Migraine, unspecified, not intractable, without status migrainosus: Secondary | ICD-10-CM | POA: Diagnosis not present

## 2014-06-07 DIAGNOSIS — Z862 Personal history of diseases of the blood and blood-forming organs and certain disorders involving the immune mechanism: Secondary | ICD-10-CM | POA: Insufficient documentation

## 2014-06-07 DIAGNOSIS — I10 Essential (primary) hypertension: Secondary | ICD-10-CM | POA: Diagnosis not present

## 2014-06-07 DIAGNOSIS — Z79899 Other long term (current) drug therapy: Secondary | ICD-10-CM | POA: Insufficient documentation

## 2014-06-07 DIAGNOSIS — R002 Palpitations: Secondary | ICD-10-CM | POA: Insufficient documentation

## 2014-06-07 DIAGNOSIS — E785 Hyperlipidemia, unspecified: Secondary | ICD-10-CM | POA: Diagnosis not present

## 2014-06-07 LAB — CBC WITH DIFFERENTIAL/PLATELET
Basophils Absolute: 0 10*3/uL (ref 0.0–0.1)
Basophils Relative: 0 % (ref 0–1)
EOS ABS: 0.3 10*3/uL (ref 0.0–0.7)
EOS PCT: 4 % (ref 0–5)
HEMATOCRIT: 41.4 % (ref 36.0–46.0)
HEMOGLOBIN: 14.7 g/dL (ref 12.0–15.0)
Lymphocytes Relative: 30 % (ref 12–46)
Lymphs Abs: 2 10*3/uL (ref 0.7–4.0)
MCH: 31.5 pg (ref 26.0–34.0)
MCHC: 35.5 g/dL (ref 30.0–36.0)
MCV: 88.8 fL (ref 78.0–100.0)
MONO ABS: 0.7 10*3/uL (ref 0.1–1.0)
MONOS PCT: 11 % (ref 3–12)
Neutro Abs: 3.8 10*3/uL (ref 1.7–7.7)
Neutrophils Relative %: 55 % (ref 43–77)
Platelets: 196 10*3/uL (ref 150–400)
RBC: 4.66 MIL/uL (ref 3.87–5.11)
RDW: 13 % (ref 11.5–15.5)
WBC: 6.8 10*3/uL (ref 4.0–10.5)

## 2014-06-07 LAB — BASIC METABOLIC PANEL
Anion gap: 13 (ref 5–15)
BUN: 20 mg/dL (ref 6–23)
CALCIUM: 10 mg/dL (ref 8.4–10.5)
CO2: 24 mEq/L (ref 19–32)
Chloride: 102 mEq/L (ref 96–112)
Creatinine, Ser: 0.75 mg/dL (ref 0.50–1.10)
GFR calc Af Amer: >90 mL/min (ref >90)
GFR, EST NON AFRICAN AMERICAN: 82 mL/min — AB (ref >90)
GLUCOSE: 103 mg/dL — AB (ref 70–99)
Potassium: 4.2 mEq/L (ref 3.7–5.3)
Sodium: 139 mEq/L (ref 137–147)

## 2014-06-07 LAB — I-STAT TROPONIN, ED: Troponin i, poc: 0 ng/mL (ref 0.00–0.08)

## 2014-06-07 LAB — TSH: TSH: 1.63 u[IU]/mL (ref 0.350–4.500)

## 2014-06-07 NOTE — Discharge Instructions (Signed)

## 2014-06-07 NOTE — ED Notes (Signed)
Pt states about 130 she woke up and felt like her heart was beating irregularly and fast  Pt denies pain at this time  Denies any other symptoms to note

## 2014-06-07 NOTE — ED Provider Notes (Addendum)
CSN: [id number]     Arrival date & time 06/07/14  7741 History   First MD Initiated Contact with Patient 06/07/14 [phone]     Chief Complaint  Patient presents with  . Palpitations     (Consider location/radiation/quality/duration/timing/severity/associated sxs/prior Treatment) HPI  This is a 73 year old female who presents with palpitations. Patient reports that she woke up at 1:30 AM. She states that she's not sure what woke her up but she was awake she noted palpitations. She denies any chest pain or shortness of breath. She states that she took her pulse and it was "all over the place." Denies any history of arrhythmia or atrial fibrillation. Has never had anything like this before.  Denies any history of thyroid disease. She denies any symptoms at this time including chest pain, shortness of breath, abdominal pain patient has a history of hypertension hyperlipidemia. No known history of coronary artery disease.  Past Medical History  Diagnosis Date  . Hyperlipidemia   . Hypertension   . Anemia   . Allergy   . Migraine    Past Surgical History  Procedure Laterality Date  . Cataract extraction  05/05/13    right eye   . Wisdom tooth extraction    . Breast biopsy    . Appendectomy    . Colonoscopy  2006  . Ectopic pregnancy surgery  1970   Family History  Problem Relation Age of Onset  . Hypertension Mother   . Hyperlipidemia Mother   . Cancer Mother     breast/colon  . Heart disease Mother   . Diabetes Father   . Emphysema Father   . Hyperlipidemia Sister   . Hypertension Sister   . Hypothyroidism Sister    History  Substance Use Topics  . Smoking status: Never Smoker   . Smokeless tobacco: Never Used  . Alcohol Use: No   OB History   Grav Para Term Preterm Abortions TAB SAB Ect Mult Living                 Review of Systems  Constitutional: Negative for fever and unexpected weight change.  Respiratory: Negative for cough, chest tightness and shortness of  breath.   Cardiovascular: Positive for palpitations. Negative for chest pain and leg swelling.  Gastrointestinal: Negative for nausea, vomiting and abdominal pain.  Genitourinary: Negative for dysuria.  Musculoskeletal: Negative for back pain.  Neurological: Negative for headaches.  Psychiatric/Behavioral: Negative for confusion.  All other systems reviewed and are negative.     Allergies  Clindamycin hcl; Codeine; Erythromycin; Tetracycline; Penicillins; and Sulfonamide derivatives  Home Medications   Prior to Admission medications   Medication Sig Start Date End Date Taking? Authorizing Provider  atenolol (TENORMIN) 100 MG tablet Take 1 tablet (100 mg total) by mouth daily. 11/29/13 11/29/14 Yes Neena Rhymes, MD  Calcium Carbonate (CALCIUM 600 PO) Take 1 capsule by mouth daily.   Yes Historical Provider, MD  Menthol-Methyl Salicylate (ICY HOT EXTRA STRENGTH EX) Apply 1 application topically daily.   Yes Historical Provider, MD  Multiple Vitamins-Minerals (MULTIVITAMIN WITH MINERALS) tablet Take 1 tablet by mouth daily.   Yes Historical Provider, MD  Omega-3 Fatty Acids (FISH OIL) 1200 MG CAPS Take 1 capsule by mouth daily.   Yes Historical Provider, MD  simvastatin (ZOCOR) 40 MG tablet Take 40 mg by mouth every evening. 1 tab po three times a week. 11/29/13  Yes Neena Rhymes, MD  sodium chloride (OCEAN) 0.65 % SOLN nasal spray Place 1 spray into  both nostrils as needed for congestion.   Yes Historical Provider, MD   BP 160/69  Pulse 47  Temp(Src) 97.8 F (36.6 C) (Oral)  Resp 18  Ht 5' 0.5" (1.537 m)  Wt 124 lb (56.246 kg)  BMI 23.81 kg/m2  SpO2 96% Physical Exam  Nursing note and vitals reviewed. Constitutional: She is oriented to person, place, and time. She appears well-developed and well-nourished. No distress.  HENT:  Head: Normocephalic and atraumatic.  Eyes: Pupils are equal, round, and reactive to light.  Neck: Neck supple. No JVD present.  Cardiovascular:  Normal rate, regular rhythm and normal heart sounds.   No murmur heard. Pulmonary/Chest: Effort normal and breath sounds normal. No respiratory distress. She has no wheezes.  Abdominal: Soft. Bowel sounds are normal. There is no tenderness. There is no rebound.  Musculoskeletal: She exhibits no edema.  Neurological: She is alert and oriented to person, place, and time.  Skin: Skin is warm and dry.  Psychiatric: She has a normal mood and affect.    ED Course  Procedures (including critical care time) Labs Review Labs Reviewed  BASIC METABOLIC PANEL - Abnormal; Notable for the following:    Glucose, Bld 103 (*)    GFR calc non Af Amer 82 (*)    All other components within normal limits  CBC WITH DIFFERENTIAL  TSH  I-STAT TROPOININ, ED    Imaging Review Dg Chest 2 View  06/07/2014   CLINICAL DATA:  Palpitations.  EXAM: CHEST  2 VIEW  COMPARISON:  None.  FINDINGS: Mild cardiomegaly. Negative aortic contours. Mild interstitial coarsening at the bases. There is no edema, consolidation, effusion, or pneumothorax. Bilateral nipple shadows. No acute osseous findings.  IMPRESSION: Cardiomegaly without failure.   Electronically Signed   By: Jorje Guild M.D.   On: 06/07/2014 05:06     EKG Interpretation   Date/Time:  Wednesday June 07 2014 03:27:47 EDT Ventricular Rate:  52 PR Interval:  167 QRS Duration: 85 QT Interval:  406 QTC Calculation: 377 R Axis:   15 Text Interpretation:  Sinus rhythm No prior for comparison Reconfirmed by  HORTON  MD, Loma Sousa ([id number]) on 06/07/2014 5:42:15 AM      MDM   Final diagnoses:  Palpitations    Patient presents with a radical date. Patient reports that she woke up this morning and noted palpitations and an irregular heartbeat. Denies any chest pain or shortness of breath. She currently is symptom-free. EKG shows normal sinus rhythm without evidence of ST elevation. Chest x-ray shows cardiomegaly without evidence of volume overloaded.  There is no prior for comparison. Basic labwork obtained and reassuring. TSH is pending. Patient has been on a monitor and has been in normal sinus rhythm. Patient did not have any chest pain and have low suspicion at this time for ACS given that she had no chest pain and noted an irregular pulse.  Heart score 3 for age and risk factors. She's been completely symptom free while in the emergency department.   Discussed with patient followup with cardiology for possible Holter monitoring and echo given cardiology on chest x-ray. Patient was given strict return precautions.  After history, exam, and medical workup I feel the patient has been appropriately medically screened and is safe for discharge home. Pertinent diagnoses were discussed with the patient. Patient was given return precautions.     Merryl Hacker, MD 06/07/14 0543  TSH wnl.  Merryl Hacker, MD 06/07/14 [phone]

## 2014-06-07 NOTE — ED Notes (Signed)
Pt states that she woke up and felt her rate heart pounding away; pt's husband states that he checked her pulse and found it to be very irregular; pt denies chest pain or SHOB; pt heart rate between 55 and 75 bpm

## 2014-06-16 ENCOUNTER — Ambulatory Visit (INDEPENDENT_AMBULATORY_CARE_PROVIDER_SITE_OTHER): Payer: Commercial Managed Care - HMO | Admitting: Cardiovascular Disease

## 2014-06-16 ENCOUNTER — Encounter: Payer: Self-pay | Admitting: Cardiovascular Disease

## 2014-06-16 VITALS — BP 160/102 | HR 50 | Ht 60.5 in | Wt 124.0 lb

## 2014-06-16 DIAGNOSIS — R002 Palpitations: Secondary | ICD-10-CM | POA: Insufficient documentation

## 2014-06-16 NOTE — Patient Instructions (Signed)
Your physician has requested that you have an echocardiogram. Echocardiography is a painless test that uses sound waves to create images of your heart. It provides your doctor with information about the size and shape of your heart and how well your heart's chambers and valves are working. This procedure takes approximately one hour. There are no restrictions for this procedure.  Your physician recommends that you continue on your current medications as directed. Please refer to the Current Medication list given to you today. Your physician recommends that you schedule a follow-up appointment as needed with Dr. Johnsie Cancel.

## 2014-06-16 NOTE — Assessment & Plan Note (Signed)
Well controlled.  Continue current medications and low sodium Dash type diet.    

## 2014-06-16 NOTE — Assessment & Plan Note (Signed)
Cholesterol is at goal.  Continue current dose of statin and diet Rx.  No myalgias or side effects.  F/U  LFT's in 6 months. Lab Results  Component Value Date   LDLCALC 91 05/05/2014

## 2014-06-16 NOTE — Progress Notes (Signed)
Patient ID: Alexandria Mcdowell, female   DOB: [birth year], 73 y.o.   MRN: [id number]    73 year old female  Seen in ER 9/23 with palpitations. Patient reports that she woke up at 1:30 AM that morning . She states that she's not sure what woke her up but she was awake she noted palpitations. She denies any chest pain or shortness of breath. She states that she took her pulse and it was "all over the place." Denies any history of arrhythmia or atrial fibrillation. Has never had anything like this before. Denies any history of thyroid disease. She denies any symptoms at this time including chest pain, shortness of breath, abdominal pain patient has a history of hypertension hyperlipidemia. No known history of coronary artery disease.  In ER enzymes negative, no arrhythmia on telemetry  TSH 1.6  CXR with mild cardiomegaly no CHF  Was on Atenolol 100 mg daily prior to ER visit   Very active does exercises 5x/week including Zumba   ROS: Denies fever, malais, weight loss, blurry vision, decreased visual acuity, cough, sputum, SOB, hemoptysis, pleuritic pain, palpitaitons, heartburn, abdominal pain, melena, lower extremity edema, claudication, or rash.  All other systems reviewed and negative   General: Affect appropriate Healthy:  appears stated age 73: normal Neck supple with no adenopathy JVP normal no bruits no thyromegaly Lungs clear with no wheezing and good diaphragmatic motion Heart:  S1/S2 no murmur,rub, gallop or click PMI normal Abdomen: benighn, BS positve, no tenderness, no AAA no bruit.  No HSM or HJR Distal pulses intact with no bruits No edema Neuro non-focal Skin warm and dry No muscular weakness  Medications Current Outpatient Prescriptions  Medication Sig Dispense Refill  . atenolol (TENORMIN) 100 MG tablet Take 1 tablet (100 mg total) by mouth daily.  90 tablet  3  . Calcium Carbonate (CALCIUM 600 PO) Take 1 capsule by mouth daily.      . Menthol-Methyl Salicylate  (ICY HOT EXTRA STRENGTH EX) Apply 1 application topically daily.      . Multiple Vitamins-Minerals (MULTIVITAMIN WITH MINERALS) tablet Take 1 tablet by mouth daily.      . Omega-3 Fatty Acids (FISH OIL) 1200 MG CAPS Take 1 capsule by mouth daily.      . simvastatin (ZOCOR) 40 MG tablet Take 40 mg by mouth every evening. 1 tab po three times a week.      . sodium chloride (OCEAN) 0.65 % SOLN nasal spray Place 1 spray into both nostrils as needed for congestion.       No current facility-administered medications for this visit.    Allergies Clindamycin hcl; Codeine; Erythromycin; Tetracycline; Penicillins; and Sulfonamide derivatives  Family History: Family History  Problem Relation Age of Onset  . Hypertension Mother   . Hyperlipidemia Mother   . Cancer Mother     breast/colon  . Heart disease Mother   . Diabetes Father   . Emphysema Father   . Hyperlipidemia Sister   . Hypertension Sister   . Hypothyroidism Sister     Social History: History   Social History  . Marital Status: Married    Spouse Name: N/A    Number of Children: N/A  . Years of Education: N/A   Occupational History  . Not on file.   Social History Main Topics  . Smoking status: Never Smoker   . Smokeless tobacco: Never Used  . Alcohol Use: No  . Drug Use: No  . Sexual Activity: Not on file   Other Topics  Concern  . 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 Laymen's guide and forms provided.    Reviewed Aug '15      Exercsie:5 days a week, treadmill   Diet: healthy, weight watchers.    Electrocardiogram:  SR rate 52 normal 06/08/14  Assessment and Plan

## 2014-06-16 NOTE — Assessment & Plan Note (Signed)
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

## 2014-06-20 ENCOUNTER — Other Ambulatory Visit (HOSPITAL_COMMUNITY): Payer: Commercial Managed Care - HMO

## 2014-06-21 ENCOUNTER — Ambulatory Visit (HOSPITAL_COMMUNITY): Payer: Medicare HMO | Attending: Internal Medicine | Admitting: Radiology

## 2014-06-21 DIAGNOSIS — R002 Palpitations: Secondary | ICD-10-CM

## 2014-06-21 DIAGNOSIS — E785 Hyperlipidemia, unspecified: Secondary | ICD-10-CM | POA: Diagnosis not present

## 2014-06-21 DIAGNOSIS — I10 Essential (primary) hypertension: Secondary | ICD-10-CM | POA: Diagnosis not present

## 2014-06-21 NOTE — Progress Notes (Signed)
Echocardiogram performed.  

## 2014-07-05 ENCOUNTER — Ambulatory Visit: Payer: Commercial Managed Care - HMO | Admitting: Cardiovascular Disease

## 2014-07-24 ENCOUNTER — Encounter: Payer: Self-pay | Admitting: Family Medicine

## 2014-07-24 DIAGNOSIS — E7800 Pure hypercholesterolemia, unspecified: Secondary | ICD-10-CM

## 2014-07-24 MED ORDER — SIMVASTATIN 20 MG PO TABS: 20.0000 mg | ORAL_TABLET | Freq: Every evening | ORAL | Status: DC

## 2014-07-31 NOTE — Addendum Note (Signed)
Addended by: Carter Kitten on: 07/31/2014 11:14 AM   Modules accepted: Orders, Medications

## 2014-08-01 NOTE — Telephone Encounter (Signed)
Pt left v/m; Pt request status of Simvastatin 20 mg to [facility] pharmacy;has med been sent?

## 2014-11-13 ENCOUNTER — Encounter: Payer: Self-pay | Admitting: Family Medicine

## 2014-11-15 ENCOUNTER — Other Ambulatory Visit (INDEPENDENT_AMBULATORY_CARE_PROVIDER_SITE_OTHER): Payer: Commercial Managed Care - HMO

## 2014-11-15 DIAGNOSIS — E7800 Pure hypercholesterolemia, unspecified: Secondary | ICD-10-CM

## 2014-11-15 LAB — LIPID PANEL
CHOLESTEROL: 152 mg/dL (ref 0–200)
HDL: 51.6 mg/dL (ref >39.00)
LDL Cholesterol: 69 mg/dL (ref 0–99)
NonHDL: 100.4
TRIGLYCERIDES: 158 mg/dL — AB (ref 0.0–149.0)
Total CHOL/HDL Ratio: 3
VLDL: 31.6 mg/dL (ref 0.0–40.0)

## 2014-11-17 ENCOUNTER — Ambulatory Visit (INDEPENDENT_AMBULATORY_CARE_PROVIDER_SITE_OTHER): Payer: Commercial Managed Care - HMO | Admitting: Family Medicine

## 2014-11-17 ENCOUNTER — Encounter: Payer: Self-pay | Admitting: Family Medicine

## 2014-11-17 VITALS — BP 130/70 | HR 55 | Temp 97.7°F | Ht 60.5 in | Wt 125.8 lb

## 2014-11-17 DIAGNOSIS — E7800 Pure hypercholesterolemia, unspecified: Secondary | ICD-10-CM

## 2014-11-17 DIAGNOSIS — M25562 Pain in left knee: Secondary | ICD-10-CM | POA: Diagnosis not present

## 2014-11-17 MED ORDER — DICLOFENAC SODIUM 75 MG PO TBEC: 75.0000 mg | DELAYED_RELEASE_TABLET | Freq: Two times a day (BID) | ORAL | Status: DC

## 2014-11-17 NOTE — Progress Notes (Signed)
Pre visit review using our clinic review tool, if applicable. No additional management support is needed unless otherwise documented below in the visit note. 

## 2014-11-17 NOTE — Addendum Note (Signed)
Addended by: Eliezer Lofts E on: 11/17/2014 09:39 AM   Modules accepted: Orders, SmartSet

## 2014-11-17 NOTE — Patient Instructions (Signed)
Continue working on healthy eating and regular exercise. Gentle stretching with left knee, avoiud deep knee bends, squatting exercises and high impact exercise.  Use diclofenac 75 mg twice daily for inflammation and pain.  If not improving in 2 weeks, follow up.

## 2014-11-17 NOTE — Progress Notes (Signed)
Subjective:    Patient ID: Alexandria Mcdowell, female    DOB: December 05, 1940, 74 y.o.   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 Off and on issues in past   No known fall or injury. Pain at medial joint line, increase pain with stairs and standing. Stiff in AM for short time.  No clicking or popping. No instability. Mild swelling, no redness, no heat. She has been using aleve and icy hot patches.. Helps some.  Elevated Cholesterol:LDL at goal < 100 on zocor 20 mg daily. Using medications without problems: None Muscle aches: None Diet compliance:Good, improved Exercise: [employer] 5 days a week despite knee issue Other complaints:  Lab Results  Component Value Date   CHOL 152 11/15/2014   HDL 51.60 11/15/2014   LDLCALC 69 11/15/2014   LDLDIRECT 87.3 05/10/2013   TRIG 158.0* 11/15/2014   CHOLHDL 3 11/15/2014   Wt Readings from Last 3 Encounters:  11/17/14 125 lb 12 oz (57.04 kg)  06/16/14 124 lb (56.246 kg)  06/07/14 124 lb (56.246 kg)      Review of Systems  Constitutional: Negative for fever and fatigue.  HENT: Negative for ear pain.   Eyes: Negative for pain.  Respiratory: Negative for chest tightness and shortness of breath.   Cardiovascular: Negative for chest pain, palpitations and leg swelling.  Gastrointestinal: Negative for abdominal pain.  Genitourinary: Negative for dysuria.       Objective:   Physical Exam  Constitutional: Vital signs are normal. She appears well-developed and well-nourished. She is cooperative.  Non-toxic appearance. She does not appear ill. No distress.  HENT:  Head: Normocephalic.  Right Ear: Hearing, tympanic membrane, external ear and ear canal normal. Tympanic membrane is not erythematous, not retracted and not bulging.  Left Ear: Hearing, tympanic membrane, external ear and ear canal normal. Tympanic  membrane is not erythematous, not retracted and not bulging.  Nose: No mucosal edema or rhinorrhea. Right sinus exhibits no maxillary sinus tenderness and no frontal sinus tenderness. Left sinus exhibits no maxillary sinus tenderness and no frontal sinus tenderness.  Mouth/Throat: Uvula is midline, oropharynx is clear and moist and mucous membranes are normal.  Eyes: Conjunctivae, EOM and lids are normal. Pupils are equal, round, and reactive to light. Lids are everted and swept, no foreign bodies found.  Neck: Trachea normal and normal range of motion. Neck supple. Carotid bruit is not present. No thyroid mass and no thyromegaly present.  Cardiovascular: Normal rate, regular rhythm, S1 normal, S2 normal, normal heart sounds, intact distal pulses and normal pulses.  Exam reveals no gallop and no friction rub.   No murmur heard. Pulmonary/Chest: Effort normal and breath sounds normal. No tachypnea. No respiratory distress. She has no decreased breath sounds. She has no wheezes. She has no rhonchi. She has no rales.  Abdominal: Soft. Normal appearance and bowel sounds are normal. There is no tenderness.  Musculoskeletal:       Right knee: Normal.       Left knee: She exhibits swelling. She exhibits normal range of motion, no effusion, normal alignment, normal patellar mobility, no bony tenderness, normal meniscus and no MCL laxity. Tenderness found. Medial joint line tenderness noted. No MCL, no LCL and no patellar tendon tenderness noted.  Neurological: She is alert.  Skin: Skin is warm, dry and intact. No rash  noted.  Psychiatric: Her speech is normal and behavior is normal. Judgment and thought content normal. Her mood appears not anxious. Cognition and memory are normal. She does not exhibit a depressed mood.          Assessment & Plan:

## 2014-11-17 NOTE — Assessment & Plan Note (Signed)
Well controlled on simvastatin 20 mg daily Encouraged exercise, weight loss, healthy eating habits.

## 2014-11-17 NOTE — Assessment & Plan Note (Signed)
No clear acute injury.  Likely OA. Treat with NSAIDs, gentle ROM exercises. Follow up if not improving for X-ray etc.

## 2014-11-21 ENCOUNTER — Encounter: Payer: Self-pay | Admitting: Family Medicine

## 2014-11-21 MED ORDER — ATENOLOL 100 MG PO TABS: 100.0000 mg | ORAL_TABLET | Freq: Every day | ORAL | Status: DC

## 2014-11-21 NOTE — Telephone Encounter (Signed)
Okay to refill as requested.

## 2014-11-21 NOTE — Telephone Encounter (Signed)
Refill sent in to [facility].

## 2014-12-26 ENCOUNTER — Ambulatory Visit (INDEPENDENT_AMBULATORY_CARE_PROVIDER_SITE_OTHER): Payer: Commercial Managed Care - HMO | Admitting: Family Medicine

## 2014-12-26 ENCOUNTER — Encounter: Payer: Self-pay | Admitting: Family Medicine

## 2014-12-26 VITALS — BP 160/80 | HR 52 | Temp 98.5°F | Ht 60.5 in | Wt 123.5 lb

## 2014-12-26 DIAGNOSIS — L02212 Cutaneous abscess of back [any part, except buttock]: Secondary | ICD-10-CM | POA: Insufficient documentation

## 2014-12-26 MED ORDER — CEPHALEXIN 500 MG PO CAPS
500.0000 mg | ORAL_CAPSULE | Freq: Three times a day (TID) | ORAL | Status: DC
Start: 2014-12-26 — End: 2015-10-24

## 2014-12-26 NOTE — Patient Instructions (Signed)
Use warm compresses 3 times a day Take Keflex 3 tablets a day for 7 days Make follow up appointment for Friday on your way out, call if redness is spreading or fever develops

## 2014-12-26 NOTE — Progress Notes (Signed)
Pre visit review using our clinic review tool, if applicable. No additional management support is needed unless otherwise documented below in the visit note. 

## 2014-12-26 NOTE — Progress Notes (Signed)
Subjective:     Patient ID: Alexandria Mcdowell, female   DOB: [birth year], 74 y.o.   MRN: [id number]  HPI Alexandria Mcdowell is a 74 y/o F with a hx of HTN and hypercholesterolemia presenting with a "cyst" on her back. Has had this for a long time, a few years at least. Has flared up once about 4-5 years ago, had a procedure done to remove it, but didn't get it all. Flared up 2 weeks ago while on vacation, was tender at that time. Patient states that the cyst "popped" while she was sleeping last night, her husband helped clean it up this morning and it was tender. Has gone down in size since "popping" last night, has been putting bacitracin and gauze pads on it. No fever, chills. No one at home has ever had a MRSA infection, no recent hospital visits, does not work in healthcare.   Hx of sebaceous cysts during adolescence. No problems with cysts until this current episode  Review of Systems  Constitutional: Negative for fever, chills and fatigue.  HENT: Positive for rhinorrhea. Negative for congestion and ear pain.   Respiratory: Negative for cough and shortness of breath.   Cardiovascular: Negative for chest pain.  Neurological: Negative for headaches.       Objective:   Physical Exam  Constitutional: She appears well-developed and well-nourished.  HENT:  Head: Normocephalic and atraumatic.  Cardiovascular: Normal rate, regular rhythm, S1 normal and S2 normal.  Exam reveals no gallop and no friction rub.   No murmur heard. Pulmonary/Chest: Effort normal and breath sounds normal.  Skin:  3 cm Abscess present on right mid-back, overlying erythema with minimal surrounding erythema. Abscess feels firm and warm to the touch. Some tenderness to palpation. Abscess is draining pus and blood from 3 pores. Patient has two other non-infected sebaceous cysts on back.  Psychiatric: She has a normal mood and affect. Her behavior is normal.       Assessment:     1. Sebaceous cyst: Sebaceous cyst appears to  be infected, currently draining from 3 pores. No systemic sx such as fever or chills.    Plan:     1. Sebaceous cyst: Advised patient to use warm compresses, keep area covered. Will send a sample of pus for bacterial culture. Take Keflex x 7 days, return for follow up on Friday if fever develops, redness spreads, or cyst is not improving. Will consider I&D at that time if cyst not improving.     Alexandria Mcdowell served as Education administrator for Dr. Diona Browner 12/26/14 9:00 am  Patient seen and examined. Med student acted as Education administrator only.  HPI/ROS/PE and assessment/plan created  By MD and scribed by med student.  Eliezer Lofts MD

## 2014-12-29 ENCOUNTER — Ambulatory Visit (INDEPENDENT_AMBULATORY_CARE_PROVIDER_SITE_OTHER): Payer: Commercial Managed Care - HMO | Admitting: Family Medicine

## 2014-12-29 ENCOUNTER — Encounter: Payer: Self-pay | Admitting: Family Medicine

## 2014-12-29 VITALS — BP 144/78 | HR 52 | Temp 98.0°F | Ht 60.5 in | Wt 124.5 lb

## 2014-12-29 DIAGNOSIS — L02212 Cutaneous abscess of back [any part, except buttock]: Secondary | ICD-10-CM | POA: Diagnosis not present

## 2014-12-29 LAB — WOUND CULTURE
GRAM STAIN: NONE SEEN
Gram Stain: NONE SEEN

## 2014-12-29 NOTE — Progress Notes (Signed)
   Subjective:    Patient ID: Alexandria Mcdowell, female    DOB: December 15, 1940, 74 y.o.   MRN: [id number]  HPI 74 year old female presents for 3 day follow up after starting keflex  For infected sebaceous cyst on right upper back . No I and D was doen at that time as lesion was draining on its own. . She reports today that the lesion is smaller, less red, less painful. Lesion has continued to drain. No fever, no flu like symptoms.  No SE to keflex. She has been doing warm compresses 2-3 times a day.  Review of Systems  Constitutional: Negative for fever and fatigue.  HENT: Negative for ear pain.   Eyes: Negative for pain.  Respiratory: Negative for chest tightness and shortness of breath.   Cardiovascular: Negative for chest pain, palpitations and leg swelling.  Gastrointestinal: Negative for abdominal pain.  Genitourinary: Negative for dysuria.       Objective:   Physical Exam  Constitutional: Vital signs are normal. She appears well-developed and well-nourished. She is cooperative.  Non-toxic appearance. She does not appear ill. No distress.  HENT:  Head: Normocephalic.  Right Ear: Hearing, tympanic membrane, external ear and ear canal normal. Tympanic membrane is not erythematous, not retracted and not bulging.  Left Ear: Hearing, tympanic membrane, external ear and ear canal normal. Tympanic membrane is not erythematous, not retracted and not bulging.  Nose: No mucosal edema or rhinorrhea. Right sinus exhibits no maxillary sinus tenderness and no frontal sinus tenderness. Left sinus exhibits no maxillary sinus tenderness and no frontal sinus tenderness.  Mouth/Throat: Uvula is midline, oropharynx is clear and moist and mucous membranes are normal.  Neck: Trachea normal and normal range of motion. Neck supple. Carotid bruit is not present. No thyroid mass and no thyromegaly present.  Cardiovascular: Normal rate, regular rhythm, S1 normal, S2 normal, normal heart sounds, intact distal  pulses and normal pulses.  Exam reveals no gallop and no friction rub.   No murmur heard. Pulmonary/Chest: Effort normal and breath sounds normal. No tachypnea. No respiratory distress. She has no decreased breath sounds. She has no wheezes. She has no rhonchi. She has no rales.  Neurological: She is alert.  Skin: Skin is warm, dry and intact. Rash noted.  3.5 cm by 2 cm oblong cyst , no surrounding erythema, superior draining  Poor , lower sites of drainage closed.  Sebaceous tissue extruded with pressure  Psychiatric: Her speech is normal and behavior is normal. Judgment and thought content normal. Her mood appears not anxious. Cognition and memory are normal. She does not exhibit a depressed mood.          Assessment & Plan:

## 2014-12-29 NOTE — Patient Instructions (Signed)
Continue warm compresses, complete antibiotics.  Call if not resolving as expected.  Call sooner if redness spreading or fever.

## 2014-12-29 NOTE — Assessment & Plan Note (Signed)
Infected sebaceous cyst improving with antibiotics and warm compresses. Complete course, pt will call if not resolving at end of antibiotics for extended course or I and D.

## 2014-12-29 NOTE — Progress Notes (Signed)
Pre visit review using our clinic review tool, if applicable. No additional management support is needed unless otherwise documented below in the visit note. 

## 2015-01-02 ENCOUNTER — Telehealth: Payer: Self-pay

## 2015-01-02 NOTE — Telephone Encounter (Signed)
If still red and tender, have pt make appt for I and D 30 min OV thurs or fri.

## 2015-01-02 NOTE — Telephone Encounter (Signed)
Pt left v/m; pt was seen 12/29/14; cyst on back has not drained for few days and pt was to cb and report that info.Please advise.

## 2015-01-03 NOTE — Telephone Encounter (Signed)
Appointment scheduled 01/05/2015 at 2:30 pm for I & D.

## 2015-01-05 ENCOUNTER — Ambulatory Visit (INDEPENDENT_AMBULATORY_CARE_PROVIDER_SITE_OTHER): Payer: Commercial Managed Care - HMO | Admitting: Family Medicine

## 2015-01-05 ENCOUNTER — Encounter: Payer: Self-pay | Admitting: Family Medicine

## 2015-01-05 VITALS — BP 175/89 | HR 54 | Temp 97.7°F | Ht 60.5 in | Wt 125.5 lb

## 2015-01-05 DIAGNOSIS — L02212 Cutaneous abscess of back [any part, except buttock]: Secondary | ICD-10-CM

## 2015-01-05 NOTE — Patient Instructions (Addendum)
Follow BP at home daily, call if BP > or equal to 140/90. Also follow pulse on atenolol. Schedule medicare wellness in 04/2015 with labs prior.

## 2015-01-05 NOTE — Progress Notes (Signed)
Infected subcutaneous cyst on mid back is dramatically improved.  Only post inflammatory hyperpigmentation remains.  No need for I and D today. Antibiotics completed. Pt reassured. No charge for OV.

## 2015-01-05 NOTE — Progress Notes (Signed)
Pre visit review using our clinic review tool, if applicable. No additional management support is needed unless otherwise documented below in the visit note. 

## 2015-01-12 ENCOUNTER — Telehealth: Payer: Self-pay | Admitting: Family Medicine

## 2015-01-12 NOTE — Telephone Encounter (Signed)
Pt called in with blood pressure readings:  159/97 149/76 149/89 142/78  pulse: between 49-53 If you have questions please call home number. Thanks.

## 2015-01-15 NOTE — Telephone Encounter (Signed)
Blood pressure is above goal. Cannot increase atenolol given low pulse, may in fact need to decrease it some given slow pulse. First have pt start HCTZ 25 mg daily #30 11 RF. Continue to follow BPs and call in 1 week with measurements.

## 2015-01-16 ENCOUNTER — Other Ambulatory Visit: Payer: Self-pay | Admitting: *Deleted

## 2015-01-16 MED ORDER — HYDROCHLOROTHIAZIDE 25 MG PO TABS: 25.0000 mg | ORAL_TABLET | Freq: Every day | ORAL | Status: DC

## 2015-01-16 NOTE — Telephone Encounter (Signed)
Patient advised. Medication phoned to pharmacy.  

## 2015-01-24 NOTE — Telephone Encounter (Addendum)
Pt left v/m leaving a list of BP readings with pulse rate for this week; BP 118/61 P 47 ;  BP 154/85 P no pulse listed;  BP 129/66 P 48;  BP 136/77 P 46;  BP 153/82 P 51;  BP 148/77 P 48;  BP 149/82 P 49;  BP 152/90 P 46;  BP 144/85 P 39; BP 120/65 P 39;  BP 142/77 P 51;  BP 133/74  P 47; and BP 146/81 P 49.

## 2015-01-25 NOTE — Telephone Encounter (Signed)
BPs remains elevated on HCTZ alone. Is patient agreeable to adding a medication to this med ( in same pill) such as losartan to get better BP control?

## 2015-01-26 NOTE — Telephone Encounter (Signed)
Alexandria Mcdowell notified as instructed by telephone.  She states her blood pressure has gone down some since calling in with her blood pressure readings: 01/25/2015 129/66 pulse 38; 125/61 pulse 44  01/26/2015 131/71 pulse 46 She request that if you still want to add an additional blood pressure medication, she would like to have a separate pill due to she already has a years worth of HCTZ and would like to be able to use those up.  Please advise.

## 2015-01-26 NOTE — Telephone Encounter (Signed)
If BP improved control, continue working on healthy lifestyle and continue following. We will keep her only on HCTZ for now. Have her call if consistently elevated.

## 2015-01-26 NOTE — Telephone Encounter (Signed)
Alexandria Mcdowell notified as instructed by telephone.

## 2015-02-14 ENCOUNTER — Other Ambulatory Visit: Payer: Self-pay | Admitting: Dermatology

## 2015-02-14 DIAGNOSIS — L570 Actinic keratosis: Secondary | ICD-10-CM | POA: Diagnosis not present

## 2015-02-14 DIAGNOSIS — C4491 Basal cell carcinoma of skin, unspecified: Secondary | ICD-10-CM

## 2015-02-14 DIAGNOSIS — C44310 Basal cell carcinoma of skin of unspecified parts of face: Secondary | ICD-10-CM | POA: Diagnosis not present

## 2015-02-14 DIAGNOSIS — C44311 Basal cell carcinoma of skin of nose: Secondary | ICD-10-CM | POA: Diagnosis not present

## 2015-02-14 DIAGNOSIS — D485 Neoplasm of uncertain behavior of skin: Secondary | ICD-10-CM | POA: Diagnosis not present

## 2015-02-14 HISTORY — DX: Basal cell carcinoma of skin, unspecified: C44.91

## 2015-03-12 ENCOUNTER — Other Ambulatory Visit: Payer: Self-pay

## 2015-03-16 ENCOUNTER — Encounter: Payer: Self-pay | Admitting: Family Medicine

## 2015-03-16 DIAGNOSIS — Z1231 Encounter for screening mammogram for malignant neoplasm of breast: Secondary | ICD-10-CM | POA: Diagnosis not present

## 2015-03-29 ENCOUNTER — Other Ambulatory Visit: Payer: Self-pay | Admitting: Dermatology

## 2015-03-29 DIAGNOSIS — C44311 Basal cell carcinoma of skin of nose: Secondary | ICD-10-CM | POA: Diagnosis not present

## 2015-03-29 DIAGNOSIS — L089 Local infection of the skin and subcutaneous tissue, unspecified: Secondary | ICD-10-CM | POA: Diagnosis not present

## 2015-04-18 ENCOUNTER — Encounter: Payer: Self-pay | Admitting: Family Medicine

## 2015-04-18 DIAGNOSIS — Z1211 Encounter for screening for malignant neoplasm of colon: Secondary | ICD-10-CM

## 2015-05-06 ENCOUNTER — Telehealth: Payer: Self-pay | Admitting: Family Medicine

## 2015-05-06 DIAGNOSIS — D649 Anemia, unspecified: Secondary | ICD-10-CM

## 2015-05-06 DIAGNOSIS — M8580 Other specified disorders of bone density and structure, unspecified site: Secondary | ICD-10-CM

## 2015-05-06 DIAGNOSIS — E7800 Pure hypercholesterolemia, unspecified: Secondary | ICD-10-CM

## 2015-05-06 NOTE — Telephone Encounter (Signed)
-----   Message from Marchia Bond sent at 04/26/2015  2:42 PM EDT ----- Regarding: cpx labs 8/22, need orders please :-) Please order  future cpx labs for pt's upcoming lab appt. Thanks Aniceto Boss

## 2015-05-07 ENCOUNTER — Other Ambulatory Visit (INDEPENDENT_AMBULATORY_CARE_PROVIDER_SITE_OTHER): Payer: Commercial Managed Care - HMO

## 2015-05-07 DIAGNOSIS — E7800 Pure hypercholesterolemia, unspecified: Secondary | ICD-10-CM

## 2015-05-07 DIAGNOSIS — D649 Anemia, unspecified: Secondary | ICD-10-CM | POA: Diagnosis not present

## 2015-05-07 DIAGNOSIS — M8580 Other specified disorders of bone density and structure, unspecified site: Secondary | ICD-10-CM | POA: Diagnosis not present

## 2015-05-07 LAB — COMPREHENSIVE METABOLIC PANEL
ALK PHOS: 103 U/L (ref 39–117)
ALT: 25 U/L (ref 0–35)
AST: 29 U/L (ref 0–37)
Albumin: 4.2 g/dL (ref 3.5–5.2)
BUN: 17 mg/dL (ref 6–23)
CALCIUM: 10.3 mg/dL (ref 8.4–10.5)
CHLORIDE: 100 meq/L (ref 96–112)
CO2: 32 mEq/L (ref 19–32)
Creatinine, Ser: 0.91 mg/dL (ref 0.40–1.20)
GFR: 64.1 mL/min (ref >60.00)
Glucose, Bld: 92 mg/dL (ref 70–99)
Potassium: 4.4 mEq/L (ref 3.5–5.1)
Sodium: 139 mEq/L (ref 135–145)
Total Bilirubin: 0.8 mg/dL (ref 0.2–1.2)
Total Protein: 7.4 g/dL (ref 6.0–8.3)

## 2015-05-07 LAB — CBC WITH DIFFERENTIAL/PLATELET
BASOS ABS: 0.1 10*3/uL (ref 0.0–0.1)
Basophils Relative: 0.5 % (ref 0.0–3.0)
Eosinophils Absolute: 0.3 10*3/uL (ref 0.0–0.7)
Eosinophils Relative: 2.9 % (ref 0.0–5.0)
HCT: 42.2 % (ref 36.0–46.0)
Hemoglobin: 14.2 g/dL (ref 12.0–15.0)
LYMPHS ABS: 1.8 10*3/uL (ref 0.7–4.0)
Lymphocytes Relative: 17.8 % (ref 12.0–46.0)
MCHC: 33.7 g/dL (ref 30.0–36.0)
MCV: 92.3 fl (ref 78.0–100.0)
MONO ABS: 0.8 10*3/uL (ref 0.1–1.0)
MONOS PCT: 7.5 % (ref 3.0–12.0)
NEUTROS ABS: 7.4 10*3/uL (ref 1.4–7.7)
NEUTROS PCT: 71.3 % (ref 43.0–77.0)
PLATELETS: 231 10*3/uL (ref 150.0–400.0)
RBC: 4.58 Mil/uL (ref 3.87–5.11)
RDW: 13.3 % (ref 11.5–15.5)
WBC: 10.3 10*3/uL (ref 4.0–10.5)

## 2015-05-07 LAB — LIPID PANEL
CHOLESTEROL: 162 mg/dL (ref 0–200)
HDL: 46.1 mg/dL (ref >39.00)
LDL Cholesterol: 77 mg/dL (ref 0–99)
NonHDL: 116.07
Total CHOL/HDL Ratio: 4
Triglycerides: 195 mg/dL — ABNORMAL HIGH (ref 0.0–149.0)
VLDL: 39 mg/dL (ref 0.0–40.0)

## 2015-05-07 LAB — VITAMIN D 25 HYDROXY (VIT D DEFICIENCY, FRACTURES): VITD: 36.4 ng/mL (ref 30.00–100.00)

## 2015-05-10 DIAGNOSIS — H5210 Myopia, unspecified eye: Secondary | ICD-10-CM | POA: Diagnosis not present

## 2015-05-10 DIAGNOSIS — H524 Presbyopia: Secondary | ICD-10-CM | POA: Diagnosis not present

## 2015-05-15 DIAGNOSIS — Z800 Family history of malignant neoplasm of digestive organs: Secondary | ICD-10-CM | POA: Diagnosis not present

## 2015-05-15 DIAGNOSIS — Z09 Encounter for follow-up examination after completed treatment for conditions other than malignant neoplasm: Secondary | ICD-10-CM | POA: Diagnosis not present

## 2015-05-15 DIAGNOSIS — Z86010 Personal history of colonic polyps: Secondary | ICD-10-CM | POA: Diagnosis not present

## 2015-05-18 ENCOUNTER — Ambulatory Visit (INDEPENDENT_AMBULATORY_CARE_PROVIDER_SITE_OTHER): Payer: Commercial Managed Care - HMO | Admitting: Family Medicine

## 2015-05-18 ENCOUNTER — Telehealth: Payer: Self-pay | Admitting: Family Medicine

## 2015-05-18 ENCOUNTER — Encounter: Payer: Self-pay | Admitting: Family Medicine

## 2015-05-18 VITALS — BP 143/63 | HR 51 | Temp 98.7°F | Ht 60.5 in | Wt 121.5 lb

## 2015-05-18 DIAGNOSIS — E7800 Pure hypercholesterolemia, unspecified: Secondary | ICD-10-CM

## 2015-05-18 DIAGNOSIS — Z23 Encounter for immunization: Secondary | ICD-10-CM

## 2015-05-18 DIAGNOSIS — Z7189 Other specified counseling: Secondary | ICD-10-CM

## 2015-05-18 DIAGNOSIS — I10 Essential (primary) hypertension: Secondary | ICD-10-CM | POA: Diagnosis not present

## 2015-05-18 DIAGNOSIS — M8580 Other specified disorders of bone density and structure, unspecified site: Secondary | ICD-10-CM | POA: Diagnosis not present

## 2015-05-18 DIAGNOSIS — Z0000 Encounter for general adult medical examination without abnormal findings: Secondary | ICD-10-CM | POA: Diagnosis not present

## 2015-05-18 NOTE — Addendum Note (Signed)
Addended byEliezer Lofts E on: 05/18/2015 12:00 PM   Modules accepted: SmartSet

## 2015-05-18 NOTE — Assessment & Plan Note (Signed)
Well controlled. Continue current medication. Trig elevated, decrease fatty acids, continue low carb diet.  Keep up regular exercise.

## 2015-05-18 NOTE — Telephone Encounter (Signed)
Noted.  Prevnar given today at her Medicare Wellness Visit.

## 2015-05-18 NOTE — Patient Instructions (Signed)
Check blood pressure at home.. Call if regularly > 140/90.

## 2015-05-18 NOTE — Progress Notes (Signed)
Pre visit review using our clinic review tool, if applicable. No additional management support is needed unless otherwise documented below in the visit note. 

## 2015-05-18 NOTE — Telephone Encounter (Signed)
Per [facility] [phone]) Prevnar 13 (procedure code [phone]) is covered at 100% for Prevnar and admin which means pt will have an estimated responsibility of $0.  Ref # for the call is [id number]  Thank you

## 2015-05-18 NOTE — Assessment & Plan Note (Signed)
Borderline control. Follow at home. Continue current medication.

## 2015-05-18 NOTE — Addendum Note (Signed)
Addended by: Carter Kitten on: 05/18/2015 11:15 AM   Modules accepted: Orders

## 2015-05-18 NOTE — Assessment & Plan Note (Signed)
Vit D nml.

## 2015-05-18 NOTE — Progress Notes (Signed)
74 year old female presents for medicare wellness. I have personally reviewed the Medicare Annual Wellness questionnaire and have noted 1.The patient's medical and social history 2.Their use of alcohol, tobacco or illicit drugs 3.Their current medications and supplements 4.The patient's functional ability including ADL's, fall risks, home safety risks and hearing or visual  impairment. 5.Diet and physical activities 6.Evidence for depression or mood disorders The patients weight, height, BMI and visual acuity have been recorded in the chart I have made referrals, counseling and provided education to the patient based review of the above and I have provided the pt with a written personalized care plan for preventive services.  Doing well overall.  Hypertension: Well controlled on atenolol BP Readings from Last 3 Encounters:  05/18/15 143/63  01/05/15 175/89  12/29/14 144/78  Using medication without problems or lightheadedness: None Chest pain with exertion:None Edema:None Short of breath:None Average home BPs: 130/70- 146/73 Other issues:  Elevated Cholesterol:LDL at goal < 130 on simvastatin. Lab Results  Component Value Date   CHOL 162 05/07/2015   HDL 46.10 05/07/2015   LDLCALC 77 05/07/2015   LDLDIRECT 87.3 05/10/2013   TRIG 195.0* 05/07/2015   CHOLHDL 4 05/07/2015  Using medications without problems: None Muscle aches: None Diet compliance: Moderate, on diabetic diet since husband dx in 09/2013 Exercise: 5 days a week, weights and treadmill Other complaints:  Elevated LFTs: elevated previously, now normal.  No family history of liver issues. No tylenol use, No ETOH. Transfusion in 1970. No IV drugs.  Review of Systems  Constitutional: Negative for fever and fatigue.  HENT: Negative for ear pain.  Eyes: Negative for pain.  Respiratory: Negative for chest tightness and shortness of breath.   Cardiovascular: Negative for chest pain, palpitations and leg swelling.  Gastrointestinal: Negative for abdominal pain.  Genitourinary: Negative for dysuria.   Cyst on back stable  History   Social History  . Marital Status: Married    Spouse Name: N/A    Number of Children: N/A  . Years of Education: N/A   Social History Main Topics  . Smoking status: Never Smoker   . Smokeless tobacco: Never Used  . Alcohol Use: No  . Drug Use: No  . Sexual Activity: None   Other Topics Concern  . 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 Laymen's guide and forms provided.    Reviewed Aug '15      Exercsie:5 days a week, treadmill   Diet: healthy, weight watchers.       Objective:   Physical Exam  Constitutional: Vital signs are normal. She appears well-developed and well-nourished. She is cooperative. Non-toxic appearance. She does not appear ill. No distress.  HENT:  Head: Normocephalic.  Right Ear: Hearing, tympanic membrane, external ear and ear canal normal.  Left Ear: Hearing, tympanic membrane, external ear and ear canal normal.  Nose: Nose normal.  Eyes: Conjunctivae, EOM and lids are normal. Pupils are equal, round, and reactive to light. Lids are everted and swept, no foreign bodies found.  Neck: Trachea normal and normal range of motion. Neck supple. Carotid bruit is not present. No mass and no thyromegaly present.  Cardiovascular: Normal rate, regular rhythm, S1 normal, S2 normal, normal  heart sounds and intact distal pulses. Exam reveals no gallop.  No murmur heard. Pulmonary/Chest: Effort normal and breath sounds normal. No respiratory distress. She  has no wheezes. She has no rhonchi. She has no rales.  Abdominal: Soft. Normal appearance and bowel sounds are normal. She exhibits no distension, no fluid wave, no abdominal bruit and no mass. There is no hepatosplenomegaly. There is no tenderness. There is no rebound, no guarding and no CVA tenderness. No hernia.  Genitourinary: Breasts bilaterally No mass, no tenderness and no skin changes, no nipple discharge.  Lymphadenopathy:   She has no cervical adenopathy.   She has no axillary adenopathy.  Neurological: She is alert. She has normal strength. No cranial nerve deficit or sensory deficit.  Skin: Skin is warm, dry and intact. No rash noted.  Psychiatric: Her speech is normal and behavior is normal. Judgment normal. Her mood appears not anxious. Cognition and memory are normal. She does not exhibit a depressed mood.          Assessment & Plan:  The patient's preventative maintenance and recommended screening tests for an annual wellness exam were reviewed in full today. Brought up to date unless services declined.  Counselled on the importance of diet, exercise, and its role in overall health and mortality. The patient's FH and SH was reviewed, including their home life, tobacco status, and drug and alcohol status.   Vaccines:uptodate with Td, pneumovax. Due for shingles, will give prevnar and flu. She refuses shingles vac today. Mammo: nml 03/2015 Colon:Dr Hayes, 2011, had performed last week.. nml per pt. DEXA: stable osteopenia 02/2014 repeat in 5 years. PAP/DVE :  PAP not indicated, no family hx of uterine ovarian cancer. Will plan DVE every 2 year, nml DVE 2015.Marland Kitchen No symptoms.

## 2015-08-18 ENCOUNTER — Encounter: Payer: Self-pay | Admitting: Family Medicine

## 2015-08-18 MED ORDER — SIMVASTATIN 20 MG PO TABS: 20.0000 mg | ORAL_TABLET | Freq: Every day | ORAL | Status: DC

## 2015-08-22 ENCOUNTER — Encounter: Payer: Self-pay | Admitting: Family Medicine

## 2015-10-07 IMAGING — CR DG CHEST 2V
2 series · 2 of 2 positions shown · non-contrast
Comparison: None.

CLINICAL DATA: Palpitations.

EXAM:
CHEST  2 VIEW

[w chest pa]
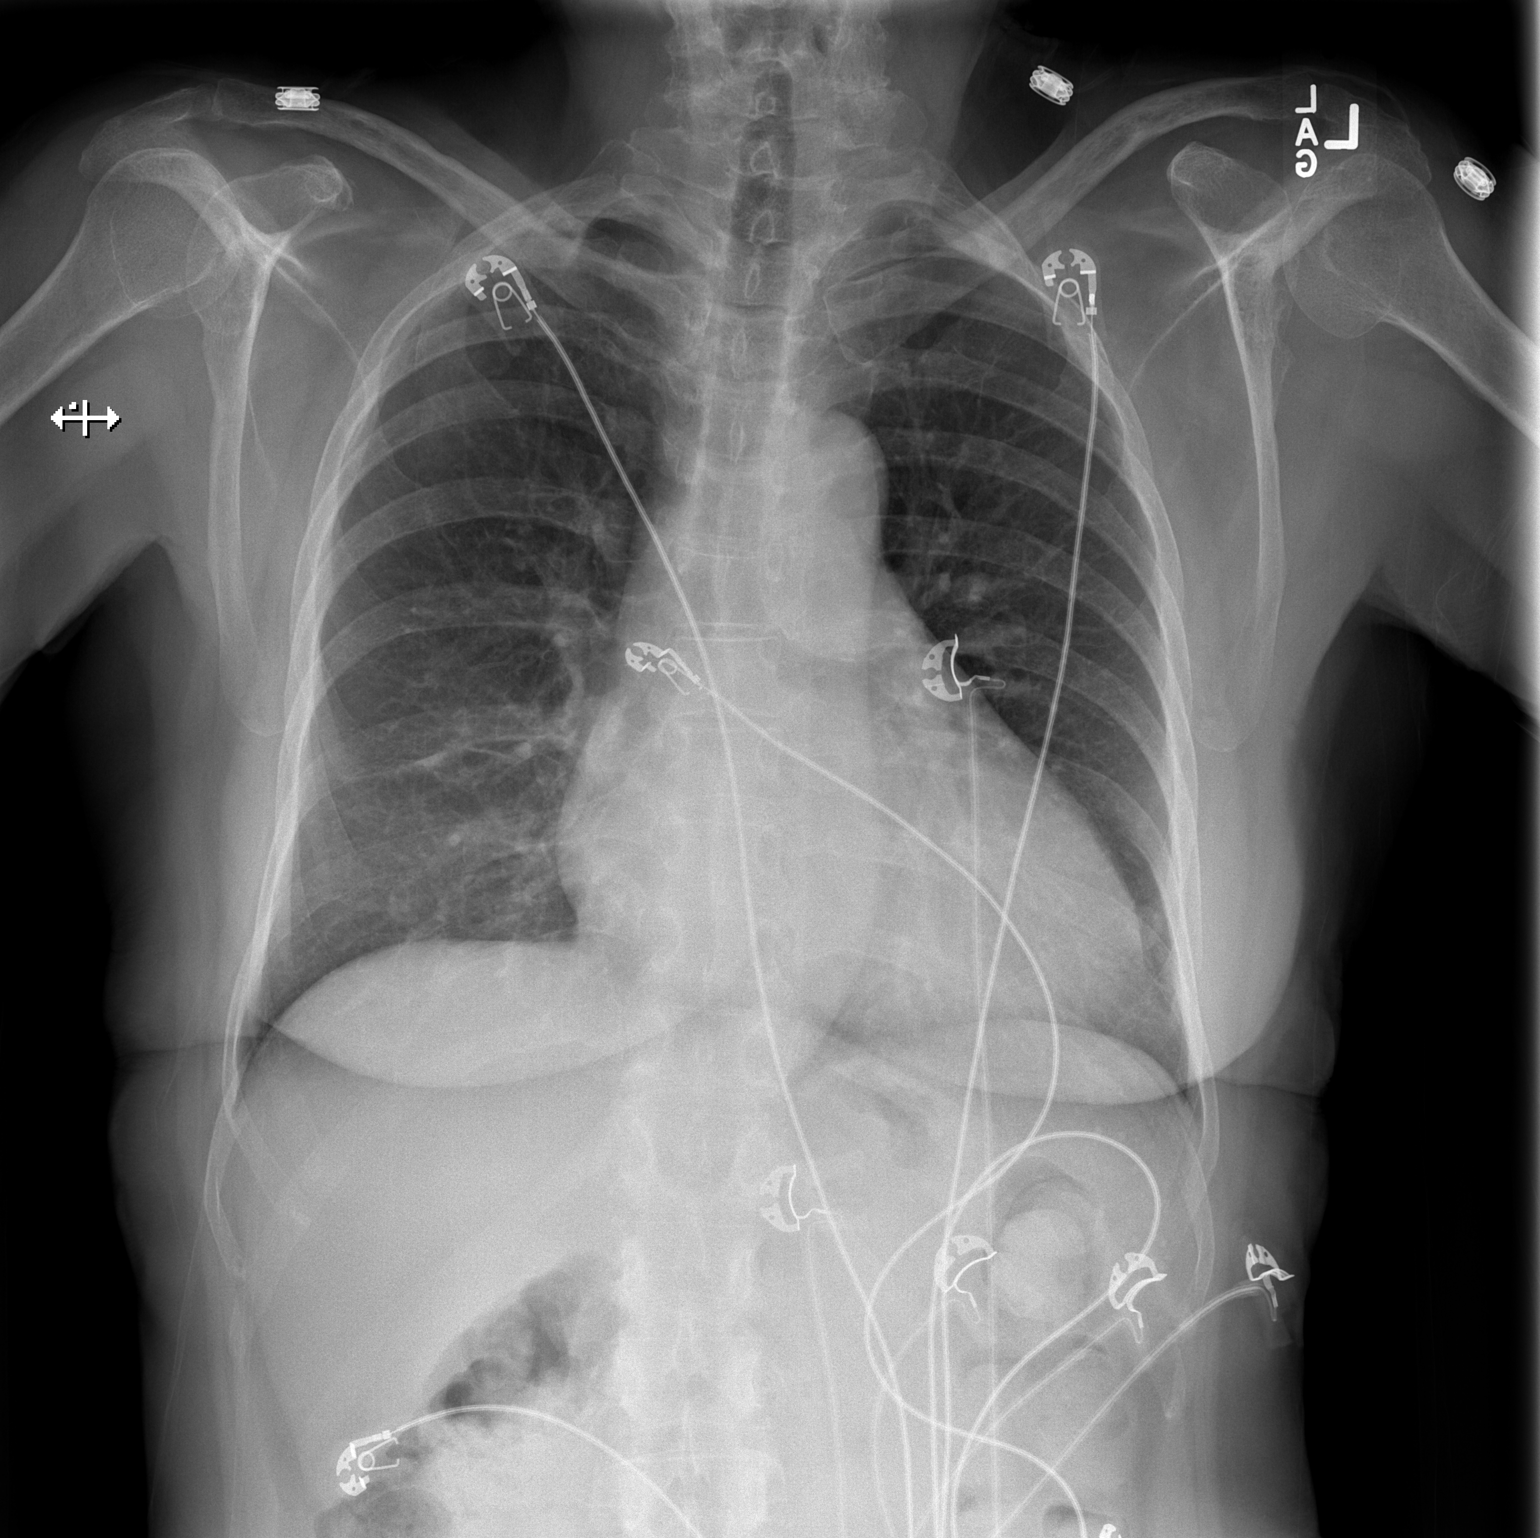

[w chest lat]
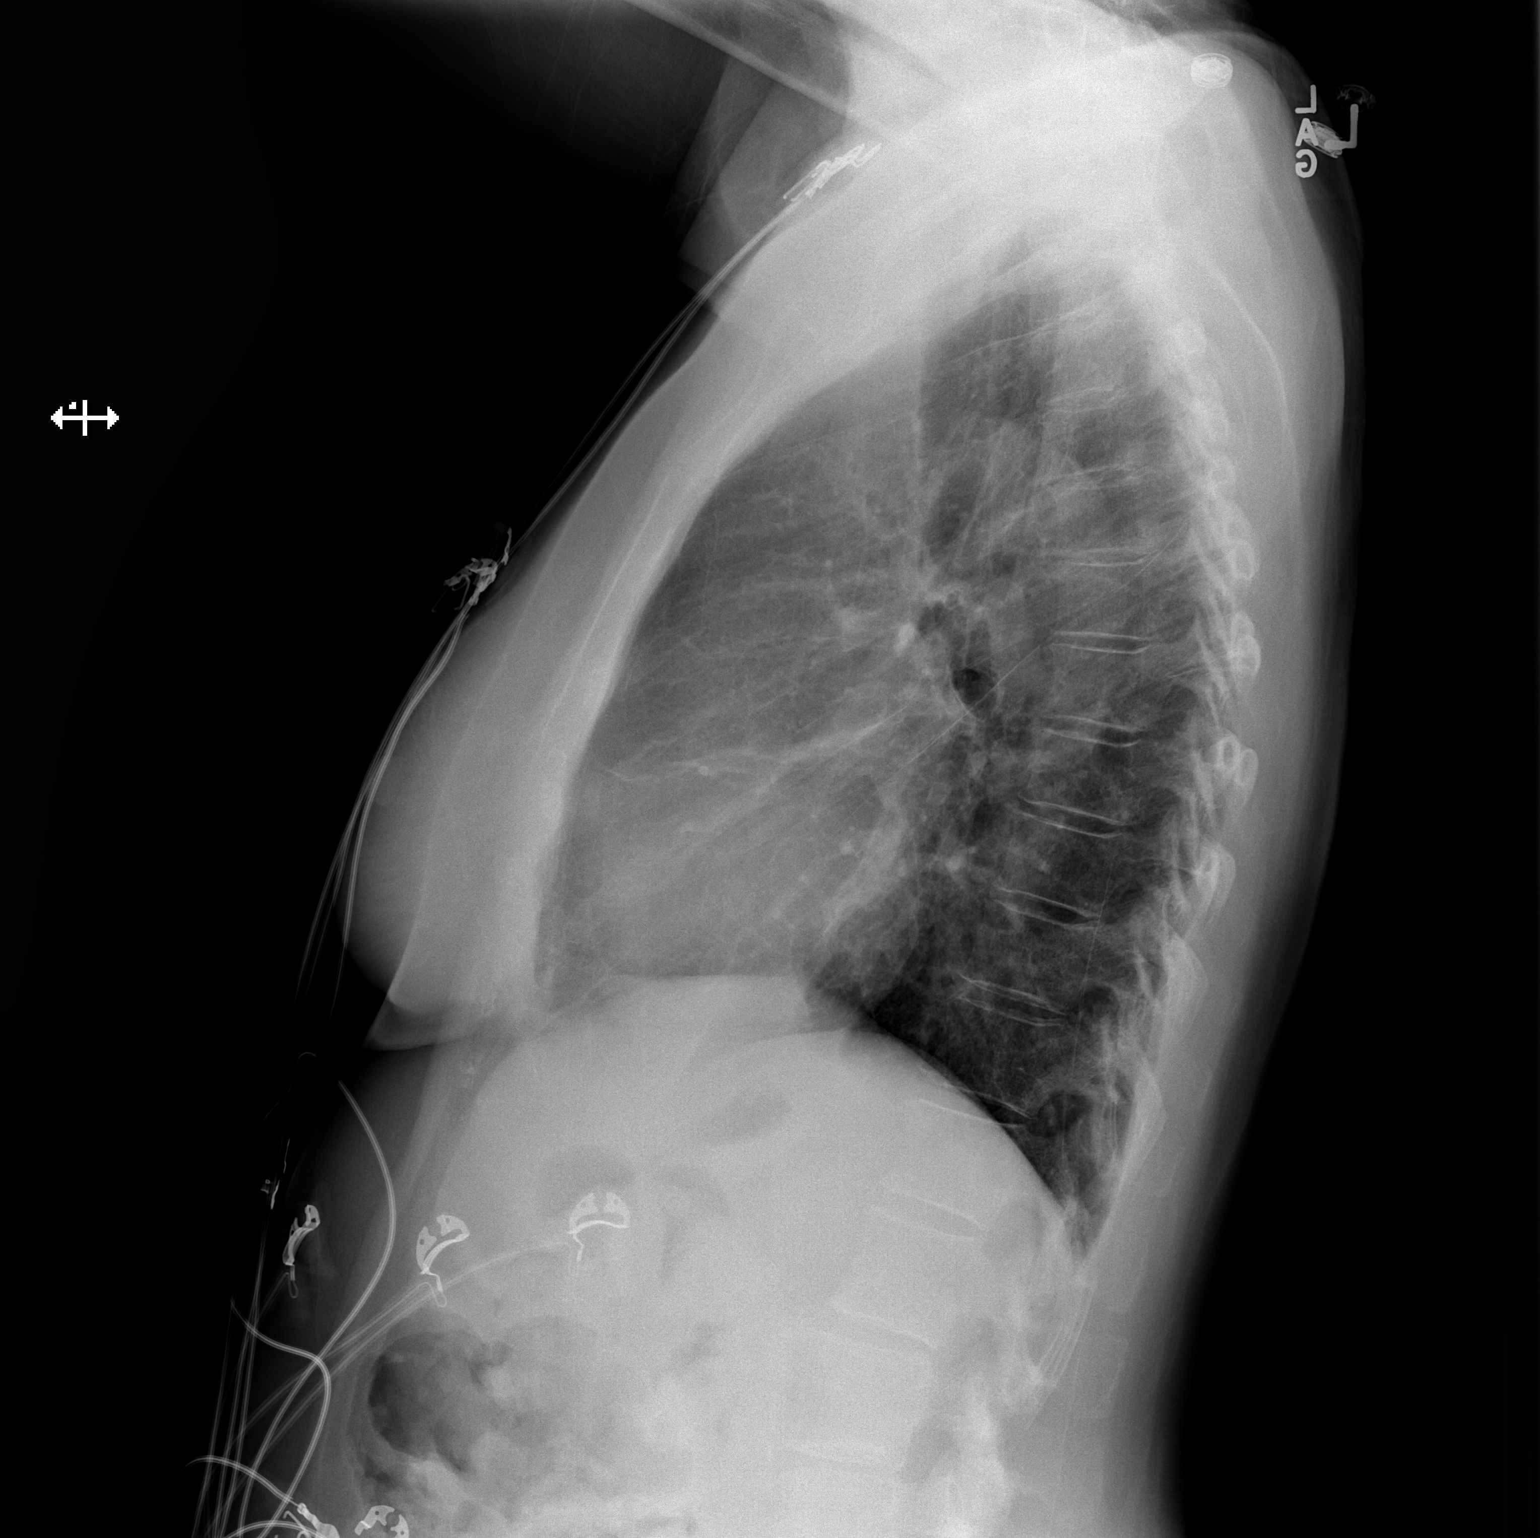

[2 of 2 positions shown; findings below may reference images not displayed]

FINDINGS: Mild cardiomegaly. Negative aortic contours. Mild interstitial
coarsening at the bases. There is no edema, consolidation, effusion,
or pneumothorax. Bilateral nipple shadows. No acute osseous
findings.
IMPRESSION: Cardiomegaly without failure.

## 2015-10-24 ENCOUNTER — Ambulatory Visit (INDEPENDENT_AMBULATORY_CARE_PROVIDER_SITE_OTHER): Payer: Commercial Managed Care - HMO | Admitting: Internal Medicine

## 2015-10-24 ENCOUNTER — Encounter: Payer: Self-pay | Admitting: Internal Medicine

## 2015-10-24 VITALS — BP 120/70 | HR 63 | Temp 99.0°F | Wt 126.0 lb

## 2015-10-24 DIAGNOSIS — L02212 Cutaneous abscess of back [any part, except buttock]: Secondary | ICD-10-CM

## 2015-10-24 DIAGNOSIS — J0100 Acute maxillary sinusitis, unspecified: Secondary | ICD-10-CM | POA: Diagnosis not present

## 2015-10-24 MED ORDER — CEPHALEXIN 500 MG PO CAPS: 500.0000 mg | ORAL_CAPSULE | Freq: Three times a day (TID) | ORAL | Status: DC

## 2015-10-24 NOTE — Progress Notes (Signed)
Subjective:    Patient ID: Alexandria Mcdowell, female    DOB: Feb 26, 1941, 75 y.o.   MRN: [id number]  HPI Here due to sinus symptoms  She gets it yearly--but earlier than usual Almost a week now---tried loratadine Nasal congestion. Lots of mucus (PND), sore throat Not much cough---just to clear mucus Headache at first--better recently No fever No night sweats or shakes No SOB No ear pain  No other meds  Current Outpatient Prescriptions on File Prior to Visit  Medication Sig Dispense Refill  . atenolol (TENORMIN) 100 MG tablet Take 1 tablet (100 mg total) by mouth daily. 90 tablet 3  . Calcium Carbonate (CALCIUM 600 PO) Take 1 capsule by mouth daily.    . diclofenac (VOLTAREN) 75 MG EC tablet Take 75 mg by mouth 2 (two) times daily.    . hydrochlorothiazide (HYDRODIURIL) 25 MG tablet Take 1 tablet (25 mg total) by mouth daily. 90 tablet 3  . Menthol-Methyl Salicylate (ICY HOT EXTRA STRENGTH EX) Apply 1 application topically daily.    . Multiple Vitamins-Minerals (MULTIVITAMIN WITH MINERALS) tablet Take 1 tablet by mouth daily.    . Omega-3 Fatty Acids (FISH OIL) 1200 MG CAPS Take 1 capsule by mouth daily.    . simvastatin (ZOCOR) 20 MG tablet Take 1 tablet (20 mg total) by mouth daily. 90 tablet 2  . sodium chloride (OCEAN) 0.65 % SOLN nasal spray Place 1 spray into both nostrils as needed for congestion.     No current facility-administered medications on file prior to visit.    Allergies  Allergen Reactions  . Clindamycin Hcl Nausea And Vomiting    nervous  . Codeine Nausea And Vomiting  . Erythromycin Nausea And Vomiting  . Tetracycline Nausea And Vomiting  . Penicillins Rash  . Sulfonamide Derivatives Nausea And Vomiting and Rash    Past Medical History  Diagnosis Date  . Hyperlipidemia   . Hypertension   . Anemia   . Allergy   . Migraine     Past Surgical History  Procedure Laterality Date  . Cataract extraction  05/05/13    right eye   . Wisdom tooth  extraction    . Breast biopsy    . Appendectomy    . Colonoscopy  2006  . Ectopic pregnancy surgery  1970    Family History  Problem Relation Age of Onset  . Hypertension Mother   . Hyperlipidemia Mother   . Cancer Mother     breast/colon  . Heart disease Mother   . Diabetes Father   . Emphysema Father   . Hyperlipidemia Sister   . Hypertension Sister   . Hypothyroidism Sister     Social History   Social History  . Marital Status: Married    Spouse Name: N/A  . Number of Children: N/A  . Years of Education: N/A   Occupational History  . Not on file.   Social History Main Topics  . Smoking status: Never Smoker   . Smokeless tobacco: Never Used  . Alcohol Use: No  . Drug Use: No  . Sexual Activity: Not on file   Other Topics Concern  . 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  Laymen's guide and forms provided.    Reviewed Aug '15      Exercsie:5 days a week, treadmill   Diet: healthy, weight watchers.   Review of Systems  No rash No vomiting or diarrhea Appetite is okay--but taste is off     Objective:   Physical Exam  Constitutional: She appears well-developed and well-nourished. No distress.  HENT:  Mouth/Throat: Oropharynx is clear and moist. No oropharyngeal exudate.  No sinus tenderness TMs okay Mild nasal inflammation   Neck: Normal range of motion. Neck supple.  Pulmonary/Chest: Effort normal and breath sounds normal. No respiratory distress. She has no wheezes. She has no rales.  Lymphadenopathy:    She has no cervical adenopathy.          Assessment & Plan:

## 2015-10-24 NOTE — Progress Notes (Signed)
Pre visit review using our clinic review tool, if applicable. No additional management support is needed unless otherwise documented below in the visit note. 

## 2015-10-24 NOTE — Patient Instructions (Signed)
Please start the antibiotic if you are worsening over the next few days 

## 2015-10-24 NOTE — Assessment & Plan Note (Signed)
Usually with allergy trigger but seems to have started with viral infection Not sure she has bacterial infection at this point Discussed supportive care Start antibiotic if worsening

## 2015-12-27 ENCOUNTER — Other Ambulatory Visit: Payer: Self-pay | Admitting: Family Medicine

## 2015-12-27 MED ORDER — HYDROCHLOROTHIAZIDE 25 MG PO TABS: ORAL_TABLET | ORAL | Status: DC

## 2015-12-27 NOTE — Addendum Note (Signed)
Addended by: Carter Kitten on: 12/27/2015 03:39 PM   Modules accepted: Orders

## 2016-01-03 ENCOUNTER — Other Ambulatory Visit: Payer: Self-pay | Admitting: Family Medicine

## 2016-03-17 ENCOUNTER — Encounter: Payer: Self-pay | Admitting: Family Medicine

## 2016-03-17 DIAGNOSIS — M8580 Other specified disorders of bone density and structure, unspecified site: Secondary | ICD-10-CM | POA: Diagnosis not present

## 2016-03-17 DIAGNOSIS — Z803 Family history of malignant neoplasm of breast: Secondary | ICD-10-CM | POA: Diagnosis not present

## 2016-03-17 DIAGNOSIS — Z1231 Encounter for screening mammogram for malignant neoplasm of breast: Secondary | ICD-10-CM | POA: Diagnosis not present

## 2016-03-17 MED ORDER — ATENOLOL 100 MG PO TABS: 100.0000 mg | ORAL_TABLET | Freq: Every day | ORAL | Status: DC

## 2016-03-17 NOTE — Telephone Encounter (Signed)
Atenolol refill has been sent into [facility] [address]   We should probably wait until closer to September before sending in the other refills.  Patient is scheduled for her Medicare Wellness with Sharrell Ku on 05/20/2016.

## 2016-03-26 ENCOUNTER — Encounter: Payer: Self-pay | Admitting: Family Medicine

## 2016-04-02 ENCOUNTER — Encounter: Payer: Self-pay | Admitting: *Deleted

## 2016-04-08 ENCOUNTER — Encounter: Payer: Self-pay | Admitting: Family Medicine

## 2016-05-16 DIAGNOSIS — Z0100 Encounter for examination of eyes and vision without abnormal findings: Secondary | ICD-10-CM | POA: Diagnosis not present

## 2016-05-20 ENCOUNTER — Ambulatory Visit (INDEPENDENT_AMBULATORY_CARE_PROVIDER_SITE_OTHER): Payer: Commercial Managed Care - HMO

## 2016-05-20 VITALS — BP 118/68 | HR 47 | Temp 97.9°F | Ht 60.5 in | Wt 123.2 lb

## 2016-05-20 DIAGNOSIS — E7800 Pure hypercholesterolemia, unspecified: Secondary | ICD-10-CM

## 2016-05-20 DIAGNOSIS — I10 Essential (primary) hypertension: Secondary | ICD-10-CM

## 2016-05-20 DIAGNOSIS — Z23 Encounter for immunization: Secondary | ICD-10-CM | POA: Diagnosis not present

## 2016-05-20 DIAGNOSIS — Z0000 Encounter for general adult medical examination without abnormal findings: Secondary | ICD-10-CM

## 2016-05-20 LAB — COMPREHENSIVE METABOLIC PANEL
ALBUMIN: 4.2 g/dL (ref 3.5–5.2)
ALT: 26 U/L (ref 0–35)
AST: 28 U/L (ref 0–37)
Alkaline Phosphatase: 102 U/L (ref 39–117)
BUN: 19 mg/dL (ref 6–23)
CALCIUM: 9.8 mg/dL (ref 8.4–10.5)
CHLORIDE: 99 meq/L (ref 96–112)
CO2: 34 mEq/L — ABNORMAL HIGH (ref 19–32)
Creatinine, Ser: 0.88 mg/dL (ref 0.40–1.20)
GFR: 66.45 mL/min (ref >60.00)
Glucose, Bld: 93 mg/dL (ref 70–99)
POTASSIUM: 3.9 meq/L (ref 3.5–5.1)
Sodium: 138 mEq/L (ref 135–145)
Total Bilirubin: 0.8 mg/dL (ref 0.2–1.2)
Total Protein: 7.5 g/dL (ref 6.0–8.3)

## 2016-05-20 LAB — CBC WITH DIFFERENTIAL/PLATELET
Basophils Absolute: 0 10*3/uL (ref 0.0–0.1)
Basophils Relative: 0.4 % (ref 0.0–3.0)
EOS PCT: 3.1 % (ref 0.0–5.0)
Eosinophils Absolute: 0.2 10*3/uL (ref 0.0–0.7)
HCT: 39.4 % (ref 36.0–46.0)
Hemoglobin: 13.8 g/dL (ref 12.0–15.0)
LYMPHS ABS: 1.4 10*3/uL (ref 0.7–4.0)
Lymphocytes Relative: 19.8 % (ref 12.0–46.0)
MCHC: 35 g/dL (ref 30.0–36.0)
MCV: 89.3 fl (ref 78.0–100.0)
MONOS PCT: 8.8 % (ref 3.0–12.0)
Monocytes Absolute: 0.6 10*3/uL (ref 0.1–1.0)
NEUTROS ABS: 4.8 10*3/uL (ref 1.4–7.7)
NEUTROS PCT: 67.9 % (ref 43.0–77.0)
Platelets: 231 10*3/uL (ref 150.0–400.0)
RBC: 4.41 Mil/uL (ref 3.87–5.11)
RDW: 12.9 % (ref 11.5–15.5)
WBC: 7 10*3/uL (ref 4.0–10.5)

## 2016-05-20 LAB — TSH: TSH: 1.19 u[IU]/mL (ref 0.35–4.50)

## 2016-05-20 LAB — LIPID PANEL
CHOL/HDL RATIO: 4
Cholesterol: 160 mg/dL (ref 0–200)
HDL: 43.4 mg/dL (ref >39.00)
NONHDL: 116.49
Triglycerides: 251 mg/dL — ABNORMAL HIGH (ref 0.0–149.0)
VLDL: 50.2 mg/dL — ABNORMAL HIGH (ref 0.0–40.0)

## 2016-05-20 LAB — LDL CHOLESTEROL, DIRECT: LDL DIRECT: 65 mg/dL

## 2016-05-20 NOTE — Progress Notes (Signed)
I reviewed health advisor's note, was available for consultation, and agree with documentation and plan.  Athalie Newhard, MD [facility] [place]  

## 2016-05-20 NOTE — Progress Notes (Signed)
Pre visit review using our clinic review tool, if applicable. No additional management support is needed unless otherwise documented below in the visit note. 

## 2016-05-20 NOTE — Patient Instructions (Signed)
Alexandria Mcdowell , Thank you for taking time to come for your Medicare Wellness Visit. I appreciate your ongoing commitment to your health goals. Please review the following plan we discussed and let me know if I can assist you in the future.   These are the goals we discussed: Goals    . Increase physical activity          Starting 05/20/2016, I will continue to exercise for at least 60 min 4-5 days per week.        This is a list of the screening recommended for you and due dates:  Health Maintenance  Topic Date Due  . Tetanus Vaccine  05/11/2023  . Colon Cancer Screening  05/14/2025  . Flu Shot  Completed  . DEXA scan (bone density measurement)  Completed  . Shingles Vaccine  Addressed  . Pneumonia vaccines  Completed   Preventive Care for Adults  A healthy lifestyle and preventive care can promote health and wellness. Preventive health guidelines for adults include the following key practices.  . A routine yearly physical is a good way to check with your health care provider about your health and preventive screening. It is a chance to share any concerns and updates on your health and to receive a thorough exam.  . Visit your dentist for a routine exam and preventive care every 6 months. Brush your teeth twice a day and floss once a day. Good oral hygiene prevents tooth decay and gum disease.  . The frequency of eye exams is based on your age, health, family medical history, use  of contact lenses, and other factors. Follow your health care provider's ecommendations for frequency of eye exams.  . Eat a healthy diet. Foods like vegetables, fruits, whole grains, low-fat dairy products, and lean protein foods contain the nutrients you need without too many calories. Decrease your intake of foods high in solid fats, added sugars, and salt. Eat the right amount of calories for you. Get information about a proper diet from your health care provider, if necessary.  . Regular physical  exercise is one of the most important things you can do for your health. Most adults should get at least 150 minutes of moderate-intensity exercise (any activity that increases your heart rate and causes you to sweat) each week. In addition, most adults need muscle-strengthening exercises on 2 or more days a week.  Silver Sneakers may be a benefit available to you. To determine eligibility, you may visit the website: www.silversneakers.com or contact program at (907) 863-5192 Mon-Fri between 8AM-8PM.   . Maintain a healthy weight. The body mass index (BMI) is a screening tool to identify possible weight problems. It provides an estimate of body fat based on height and weight. Your health care provider can find your BMI and can help you achieve or maintain a healthy weight.   For adults 20 years and older: ? A BMI below 18.5 is considered underweight. ? A BMI of 18.5 to 24.9 is normal. ? A BMI of 25 to 29.9 is considered overweight. ? A BMI of 30 and above is considered obese.   . Maintain normal blood lipids and cholesterol levels by exercising and minimizing your intake of saturated fat. Eat a balanced diet with plenty of fruit and vegetables. Blood tests for lipids and cholesterol should begin at age 54 and be repeated every 5 years. If your lipid or cholesterol levels are high, you are over 50, or you are at high risk for heart  disease, you may need your cholesterol levels checked more frequently. Ongoing high lipid and cholesterol levels should be treated with medicines if diet and exercise are not working.  . If you smoke, find out from your health care provider how to quit. If you do not use tobacco, please do not start.  . If you choose to drink alcohol, please do not consume more than 2 drinks per day. One drink is considered to be 12 ounces (355 mL) of beer, 5 ounces (148 mL) of wine, or 1.5 ounces (44 mL) of liquor.  . If you are 52-23 years old, ask your health care provider if you  should take aspirin to prevent strokes.  . Use sunscreen. Apply sunscreen liberally and repeatedly throughout the day. You should seek shade when your shadow is shorter than you. Protect yourself by wearing long sleeves, pants, a wide-brimmed hat, and sunglasses year round, whenever you are outdoors.  . Once a month, do a whole body skin exam, using a mirror to look at the skin on your back. Tell your health care provider of new moles, moles that have irregular borders, moles that are larger than a pencil eraser, or moles that have changed in shape or color.

## 2016-05-20 NOTE — Progress Notes (Signed)
PCP notes:   Health maintenance:  Flu vaccine - administered  Abnormal screenings:   None  Patient concerns:   None  Nurse concerns:  None  Next PCP appt:   05/23/16 @ 1045

## 2016-05-20 NOTE — Progress Notes (Signed)
Subjective:   Alexandria Mcdowell is a 75 y.o. female who presents for Medicare Annual (Subsequent) preventive examination.  Review of Systems:  N/A Cardiac Risk Factors include: advanced age (>68men, >68 women);dyslipidemia;hypertension     Objective:     Vitals: BP 118/68 (BP Location: Left Arm, Patient Position: Sitting, Cuff Size: Normal)   Pulse (!) 47   Temp 97.9 F (36.6 C) (Oral)   Ht 5' 0.5" (1.537 m) Comment: no shoes  Wt 123 lb 4 oz (55.9 kg)   SpO2 97%   BMI 23.67 kg/m   Body mass index is 23.67 kg/m.   Tobacco History  Smoking Status  . Never Smoker  Smokeless Tobacco  . Never Used     Counseling given: Not Answered   Past Medical History:  Diagnosis Date  . Allergy   . Anemia   . Hyperlipidemia   . Hypertension   . Migraine    Past Surgical History:  Procedure Laterality Date  . APPENDECTOMY    . BREAST BIOPSY    . CATARACT EXTRACTION  05/05/13   right eye   . COLONOSCOPY  2006  . [place]  . WISDOM TOOTH EXTRACTION     Family History  Problem Relation Age of Onset  . Hypertension Mother   . Hyperlipidemia Mother   . Cancer Mother     breast/colon  . Heart disease Mother   . Diabetes Father   . Emphysema Father   . Hyperlipidemia Sister   . Hypertension Sister   . Hypothyroidism Sister    History  Sexual Activity  . Sexual activity: Yes    Outpatient Encounter Prescriptions as of 05/20/2016  Medication Sig  . atenolol (TENORMIN) 100 MG tablet Take 1 tablet (100 mg total) by mouth daily.  . Calcium Carbonate (CALCIUM 600 PO) Take 2 capsules by mouth daily.   . hydrochlorothiazide (HYDRODIURIL) 25 MG tablet TAKE 1 TABLET (25 MG TOTAL) BY MOUTH DAILY.  Marland Kitchen Menthol-Methyl Salicylate (ICY HOT EXTRA STRENGTH EX) Apply 1 application topically daily as needed.   . Multiple Vitamins-Minerals (MULTIVITAMIN WITH MINERALS) tablet Take 1 tablet by mouth daily.  . Omega-3 Fatty Acids (FISH OIL) 1200 MG CAPS Take 1  capsule by mouth daily.  . simvastatin (ZOCOR) 20 MG tablet Take 1 tablet (20 mg total) by mouth daily.  . sodium chloride (OCEAN) 0.65 % SOLN nasal spray Place 1 spray into both nostrils as needed for congestion.  . [DISCONTINUED] diclofenac (VOLTAREN) 75 MG EC tablet Take 75 mg by mouth 2 (two) times daily.  . [DISCONTINUED] cephALEXin (KEFLEX) 500 MG capsule Take 1 capsule (500 mg total) by mouth 3 (three) times daily.   No facility-administered encounter medications on file as of 05/20/2016.     Activities of Daily Living In your present state of health, do you have any difficulty performing the following activities: 05/20/2016  Hearing? Y  Vision? N  Difficulty concentrating or making decisions? N  Walking or climbing stairs? N  Dressing or bathing? N  Doing errands, shopping? N  Preparing Food and eating ? N  Using the Toilet? N  In the past six months, have you accidently leaked urine? N  Do you have problems with loss of bowel control? N  Managing your Medications? N  Managing your Finances? N  Housekeeping or managing your Housekeeping? N  Some recent data might be hidden    Patient Care Team: Jinny Sanders, MD as PCP - General (Family Medicine) Tressie Ellis  Denna Haggard, MD (Dermatology) Madilyn Hook, OD as Consulting Physician (Optometry)    Assessment:    Hearing Screening Comments: Wears Bilateral Hearing Aids Vision Screening Comments: Last vision exam on 05/16/2016 with Dr. Tamala Julian  Exercise Activities and Dietary recommendations Current Exercise Habits: Home exercise routine, Type of exercise: strength training/weights;Other - see comments (stationary bike), Time (Minutes): 60, Frequency (Times/Week): 5, Weekly Exercise (Minutes/Week): 300, Exercise limited by: None identified  Goals    . Increase physical activity          Starting 05/20/2016, I will continue to exercise for at least 60 min 4-5 days per week.       Fall Risk Fall Risk  05/20/2016 05/18/2015 05/12/2014  Falls in  the past year? No No No   Depression Screen PHQ 2/9 Scores 05/20/2016 05/18/2015 12/26/2014 05/12/2014  PHQ - 2 Score 0 0 0 0     Cognitive Testing MMSE - Mini Mental State Exam 05/20/2016  Orientation to time 5  Orientation to Place 5  Registration 3  Attention/ Calculation 0  Recall 3  Language- name 2 objects 0  Language- repeat 1  Language- follow 3 step command 3  Language- read & follow direction 0  Write a sentence 0  Copy design 0  Total score 20   PLEASE NOTE: A Mini-Cog screen was completed. Maximum score is 20. A value of 0 denotes this part of Folstein MMSE was not completed or the patient failed this part of the Mini-Cog screening.   Mini-Cog Screening Orientation to Time - Max 5 pts Orientation to Place - Max 5 pts Registration - Max 3 pts Recall - Max 3 pts Language Repeat - Max 1 pts Language Follow 3 Step Command - Max 3 pts  Immunization History  Administered Date(s) Administered  . Influenza Split 06/19/2011, 06/22/2012  . Influenza Whole 06/29/2007, 06/20/2008, 06/13/2010  . Influenza,inj,Quad PF,36+ Mos 05/17/2013, 05/12/2014, 05/18/2015, 05/20/2016  . Pneumococcal Conjugate-13 05/18/2015  . Pneumococcal Polysaccharide-23 02/23/2007  . Td 01/30/2003  . Tetanus 05/10/2013   Screening Tests Health Maintenance  Topic Date Due  . TETANUS/TDAP  05/11/2023  . COLONOSCOPY  05/14/2025  . INFLUENZA VACCINE  Completed  . DEXA SCAN  Completed  . ZOSTAVAX  Addressed  . PNA vac Low Risk Adult  Completed      Plan:     I have personally reviewed and addressed the Medicare Annual Wellness questionnaire and have noted the following in the patient's chart:  A. Medical and social history B. Use of alcohol, tobacco or illicit drugs  C. Current medications and supplements D. Functional ability and status E.  Nutritional status F.  Physical activity G. Advance directives H. List of other physicians I.  Hospitalizations, surgeries, and ER visits in previous 12  months J.  [place] to include hearing, vision, cognitive, depression L. Referrals and appointments - none  In addition, I have reviewed and discussed with patient certain preventive protocols, quality metrics, and best practice recommendations. A written personalized care plan for preventive services as well as general preventive health recommendations were provided to patient.  See attached scanned questionnaire for additional information.   Signed,   Lindell Noe, MHA, BS, LPN Health Advisor

## 2016-05-23 ENCOUNTER — Encounter: Payer: Self-pay | Admitting: Family Medicine

## 2016-05-23 ENCOUNTER — Other Ambulatory Visit: Payer: Self-pay | Admitting: Family Medicine

## 2016-05-23 ENCOUNTER — Ambulatory Visit (INDEPENDENT_AMBULATORY_CARE_PROVIDER_SITE_OTHER): Payer: Commercial Managed Care - HMO | Admitting: Family Medicine

## 2016-05-23 VITALS — BP 162/82 | HR 51 | Temp 97.7°F | Ht 60.5 in | Wt 124.0 lb

## 2016-05-23 DIAGNOSIS — E7800 Pure hypercholesterolemia, unspecified: Secondary | ICD-10-CM

## 2016-05-23 DIAGNOSIS — I10 Essential (primary) hypertension: Secondary | ICD-10-CM

## 2016-05-23 DIAGNOSIS — M8580 Other specified disorders of bone density and structure, unspecified site: Secondary | ICD-10-CM

## 2016-05-23 MED ORDER — HYDROCHLOROTHIAZIDE 25 MG PO TABS: ORAL_TABLET | ORAL | 1 refills | Status: DC

## 2016-05-23 MED ORDER — SIMVASTATIN 20 MG PO TABS: 20.0000 mg | ORAL_TABLET | Freq: Every day | ORAL | 1 refills | Status: DC

## 2016-05-23 NOTE — Assessment & Plan Note (Signed)
LDl at goal < 130, increase omega 3 to lower triglycerides.

## 2016-05-23 NOTE — Patient Instructions (Addendum)
Follow BP at home, call if BP > 140/90 regularly.  Increase omega three fatty acids to 2000-4000 mg daily. Work on healthy eating and keep up with regular exercise.

## 2016-05-23 NOTE — Telephone Encounter (Signed)
Patient left voicemail to have refill hydrochlorothiazide (HYDRODIURIL) 25 MG tablet and simvastatin (ZOCOR) 20 MG tablet  Last seen on 05/23/2016. Will refill as requested.

## 2016-05-23 NOTE — Assessment & Plan Note (Signed)
Stable

## 2016-05-23 NOTE — Progress Notes (Signed)
Pre visit review using our clinic review tool, if applicable. No additional management support is needed unless otherwise documented below in the visit note. 

## 2016-05-23 NOTE — Progress Notes (Signed)
Earlier  she saw Candis Musa, LPN for medicare wellness. Note will be reviewed in detail when completed.  Doing well overall.  Hypertension: Well controlled on atenolol at home, no sure why high. BP Readings from Last 3 Encounters:  05/23/16 (!) 162/82  05/20/16 118/68  10/24/15 120/70   Using medication without problems or lightheadedness: None Chest pain with exertion:None Edema:None Short of breath:None Average home BPs: 130/70- 146/73 Other issues:  Elevated Cholesterol:LDL at goal < 130 on simvastatin. Lab Results  Component Value Date   CHOL 160 05/20/2016   HDL 43.40 05/20/2016   LDLCALC 77 05/07/2015   LDLDIRECT 65.0 05/20/2016   TRIG 251.0 (H) 05/20/2016   CHOLHDL 4 05/20/2016  Using medications without problems: None Muscle aches: None Diet compliance: Moderate, on diabetic diet since husband dx in 09/2013 Exercise: 5 days a week, weights and treadmill Other complaints:  Elevated LFTs: elevated previously, now normal.  No family history of liver issues. No tylenol use, No ETOH. Transfusion in 1970. No IV drugs.  Social History /Family History/Past Medical History reviewed and updated if needed.  Review of Systems  Constitutional: Negative for fever and fatigue.  HENT: Negative for ear pain.  Eyes: Negative for pain.  Respiratory: Negative for chest tightness and shortness of breath.  Cardiovascular: Negative for chest pain, palpitations and leg swelling.  Gastrointestinal: Negative for abdominal pain.  Genitourinary: Negative for dysuria.   Cyst on back stable  History        Social History  . Marital Status: Married    Spouse Name: N/A    Number of Children: N/A  . Years of Education: N/A       Social History Main Topics  . Smoking status: Never Smoker   . Smokeless tobacco: Never Used  . Alcohol Use: No  . Drug Use: No  . Sexual Activity: None       Other Topics Concern  . 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 Laymen's guide and forms provided.    Reviewed Aug '15      Exercsie:5 days a week, treadmill   Diet: healthy, weight watchers.       Objective:   Physical Exam  Constitutional: Vital signs are normal. She appears well-developed and well-nourished. She is cooperative. Non-toxic appearance. She does not appear ill. No distress.  HENT:  Head: Normocephalic.  Right Ear: Hearing, tympanic membrane, external ear and ear canal normal.  Left Ear: Hearing, tympanic membrane, external ear and ear canal normal.  Nose: Nose normal.  Eyes: Conjunctivae, EOM and lids are normal. Pupils are equal, round, and reactive to light. Lids are everted and swept, no foreign bodies found.  Neck: Trachea normal and normal range of motion. Neck supple. Carotid bruit is not present. No mass and no thyromegaly present.  Cardiovascular: Normal rate, regular rhythm, S1 normal, S2 normal, normal heart sounds and intact distal pulses. Exam reveals no gallop.  No murmur heard. Pulmonary/Chest: Effort normal and breath sounds normal. No respiratory distress. She has no wheezes. She has no rhonchi. She has no rales.  Abdominal: Soft. Normal appearance and bowel sounds are normal. She exhibits no distension, no fluid wave, no abdominal bruit  and no mass. There is no hepatosplenomegaly. There is no tenderness. There is no rebound, no guarding and no CVA tenderness. No hernia.  Genitourinary: Breasts bilaterally No mass, no tenderness and no skin changes, no nipple discharge.  Lymphadenopathy:   She has no cervical adenopathy.   She has no axillary adenopathy.  Neurological: She is alert. She has  normal strength. No cranial nerve deficit or sensory deficit.  Skin: Skin is warm, dry and intact. No rash noted.  Psychiatric: Her speech is normal and behavior is normal. Judgment normal. Her mood appears not anxious. Cognition and memory are normal. She does not exhibit a depressed mood.          Assessment & Plan:  The patient's preventative maintenance and recommended screening tests for an annual wellness exam were reviewed in full today. Brought up to date unless services declined.  Counselled on the importance of diet, exercise, and its role in overall health and mortality. The patient's FH and SH was reviewed, including their home life, tobacco status, and drug and alcohol status.   Vaccines:uptodate , not interested in shingles vaccine. Mammo: nml 03/2015, Plans q2 years. Mother breast cancer. Colon:Dr Amedeo Plenty, 2016 nml,  Mother with colon cancer. DEXA: stable osteopenia 03/2016 PAP/DVE :  PAP not indicated, no family hx of uterine ovarian cancer.  No concerns. No DVE indicated.  No smoker

## 2016-05-23 NOTE — Assessment & Plan Note (Signed)
Well controlled. Continue current medication.  

## 2016-05-29 ENCOUNTER — Encounter: Payer: Self-pay | Admitting: Family Medicine

## 2016-05-29 MED ORDER — LISINOPRIL-HYDROCHLOROTHIAZIDE 10-12.5 MG PO TABS: 1.0000 | ORAL_TABLET | Freq: Every day | ORAL | 11 refills | Status: DC

## 2016-06-13 ENCOUNTER — Other Ambulatory Visit: Payer: Self-pay | Admitting: Family Medicine

## 2016-06-20 ENCOUNTER — Encounter: Payer: Self-pay | Admitting: Family Medicine

## 2016-06-20 ENCOUNTER — Ambulatory Visit (INDEPENDENT_AMBULATORY_CARE_PROVIDER_SITE_OTHER): Payer: Commercial Managed Care - HMO | Admitting: Family Medicine

## 2016-06-20 VITALS — BP 127/63 | HR 47 | Temp 97.9°F | Ht 60.5 in | Wt 125.5 lb

## 2016-06-20 DIAGNOSIS — I10 Essential (primary) hypertension: Secondary | ICD-10-CM

## 2016-06-20 DIAGNOSIS — R001 Bradycardia, unspecified: Secondary | ICD-10-CM | POA: Insufficient documentation

## 2016-06-20 LAB — BASIC METABOLIC PANEL
BUN: 18 mg/dL (ref 6–23)
CHLORIDE: 102 meq/L (ref 96–112)
CO2: 31 meq/L (ref 19–32)
CREATININE: 0.91 mg/dL (ref 0.40–1.20)
Calcium: 10.1 mg/dL (ref 8.4–10.5)
GFR: 63.91 mL/min (ref >60.00)
GLUCOSE: 91 mg/dL (ref 70–99)
POTASSIUM: 4.5 meq/L (ref 3.5–5.1)
Sodium: 139 mEq/L (ref 135–145)

## 2016-06-20 MED ORDER — ATENOLOL 50 MG PO TABS: 50.0000 mg | ORAL_TABLET | Freq: Every day | ORAL | 3 refills | Status: DC

## 2016-06-20 NOTE — Progress Notes (Signed)
   Subjective:    Patient ID: Alexandria Mcdowell, female    DOB: [birth year], 75 y.o.   MRN: [id number]  HPI  75 year old female presents for follow up blood pressure elevations.  recent change from  HCTZ to combo lisinopril HCTZ.  Today she reports BP has improved significantly... [id number]. Occ having 93/53  HR running 43-55 but this is usual for her.  No SE, no dry cough. No lightheadedness, no CP, no SOB.  No swelling in ankles.  She has been on atenolol for years, started for HTN no other reason.  BP Readings from Last 3 Encounters:  06/20/16 127/63  05/23/16 (!) 162/82  05/20/16 118/68      Review of Systems  Constitutional: Negative for fatigue and fever.  HENT: Negative for ear pain.   Eyes: Negative for pain.  Respiratory: Negative for chest tightness and shortness of breath.   Cardiovascular: Negative for chest pain, palpitations and leg swelling.  Gastrointestinal: Negative for abdominal pain.  Genitourinary: Negative for dysuria.       Objective:   Physical Exam  Constitutional: Vital signs are normal. She appears well-developed and well-nourished. She is cooperative.  Non-toxic appearance. She does not appear ill. No distress.  HENT:  Head: Normocephalic.  Right Ear: Hearing, tympanic membrane, external ear and ear canal normal. Tympanic membrane is not erythematous, not retracted and not bulging.  Left Ear: Hearing, tympanic membrane, external ear and ear canal normal. Tympanic membrane is not erythematous, not retracted and not bulging.  Nose: No mucosal edema or rhinorrhea. Right sinus exhibits no maxillary sinus tenderness and no frontal sinus tenderness. Left sinus exhibits no maxillary sinus tenderness and no frontal sinus tenderness.  Mouth/Throat: Uvula is midline, oropharynx is clear and moist and mucous membranes are normal.  Eyes: Conjunctivae, EOM and lids are normal. Pupils are equal, round, and reactive to light. Lids are everted and swept, no  foreign bodies found.  Neck: Trachea normal and normal range of motion. Neck supple. Carotid bruit is not present. No thyroid mass and no thyromegaly present.  Cardiovascular: Normal rate, regular rhythm, S1 normal, S2 normal, normal heart sounds, intact distal pulses and normal pulses.  Exam reveals no gallop and no friction rub.   No murmur heard. Pulmonary/Chest: Effort normal and breath sounds normal. No tachypnea. No respiratory distress. She has no decreased breath sounds. She has no wheezes. She has no rhonchi. She has no rales.  Abdominal: Soft. Normal appearance and bowel sounds are normal. There is no tenderness.  Neurological: She is alert.  Skin: Skin is warm, dry and intact. No rash noted.  Psychiatric: Her speech is normal and behavior is normal. Judgment and thought content normal. Her mood appears not anxious. Cognition and memory are normal. She does not exhibit a depressed mood.          Assessment & Plan:

## 2016-06-20 NOTE — Assessment & Plan Note (Addendum)
Improved and now at goal on lisinopril HCTZ.. Creatinine on ACEI new start. Check Occ lows asymptomatic.  Given bradycardia and lows.. Will try decreasing atenolol.

## 2016-06-20 NOTE — Progress Notes (Signed)
Pre visit review using our clinic review tool, if applicable. No additional management support is needed unless otherwise documented below in the visit note. 

## 2016-06-20 NOTE — Assessment & Plan Note (Signed)
Due to [employer].  Decrease to 50 mg daily. Follow BP and pulse.

## 2016-06-20 NOTE — Patient Instructions (Addendum)
Stop at lab on way out for BMET. Conitnue lisinopril HCTZ.  Decrease atenolol to 50 mg daily.  Follow BP and pulse at home.. Send a MyChart message with measurement 1-2 weeks after making the change.

## 2016-07-04 ENCOUNTER — Other Ambulatory Visit: Payer: Self-pay

## 2016-07-04 MED ORDER — ATENOLOL 50 MG PO TABS: 50.0000 mg | ORAL_TABLET | Freq: Every day | ORAL | 3 refills | Status: DC

## 2016-07-04 NOTE — Telephone Encounter (Signed)
Pt left v/m; pt was seen 06/20/16; [facility] not have aten[facility]ol 50 mg at [facility] and pt request [facility] day refill x [facility] [address]. Pt notified done.

## 2016-07-08 ENCOUNTER — Other Ambulatory Visit: Payer: Self-pay | Admitting: Dermatology

## 2016-07-08 DIAGNOSIS — L570 Actinic keratosis: Secondary | ICD-10-CM | POA: Diagnosis not present

## 2016-07-08 DIAGNOSIS — D0472 Carcinoma in situ of skin of left lower limb, including hip: Secondary | ICD-10-CM | POA: Diagnosis not present

## 2016-11-17 ENCOUNTER — Encounter: Payer: Self-pay | Admitting: Family Medicine

## 2016-11-17 MED ORDER — SIMVASTATIN 20 MG PO TABS: 20.0000 mg | ORAL_TABLET | Freq: Every day | ORAL | 1 refills | Status: DC

## 2016-11-17 NOTE — Telephone Encounter (Signed)
Simvastatin refills sent into [place].

## 2016-11-18 NOTE — Telephone Encounter (Signed)
-

## 2017-03-19 ENCOUNTER — Encounter: Payer: Self-pay | Admitting: Family Medicine

## 2017-03-19 DIAGNOSIS — Z803 Family history of malignant neoplasm of breast: Secondary | ICD-10-CM | POA: Diagnosis not present

## 2017-03-19 DIAGNOSIS — Z1231 Encounter for screening mammogram for malignant neoplasm of breast: Secondary | ICD-10-CM | POA: Diagnosis not present

## 2017-03-23 DIAGNOSIS — L570 Actinic keratosis: Secondary | ICD-10-CM | POA: Diagnosis not present

## 2017-03-23 DIAGNOSIS — L723 Sebaceous cyst: Secondary | ICD-10-CM | POA: Diagnosis not present

## 2017-03-23 DIAGNOSIS — D229 Melanocytic nevi, unspecified: Secondary | ICD-10-CM | POA: Diagnosis not present

## 2017-05-08 ENCOUNTER — Other Ambulatory Visit: Payer: Self-pay

## 2017-05-08 MED ORDER — LISINOPRIL-HYDROCHLOROTHIAZIDE 10-12.5 MG PO TABS: 1.0000 | ORAL_TABLET | Freq: Every day | ORAL | 0 refills | Status: DC

## 2017-05-08 MED ORDER — SIMVASTATIN 20 MG PO TABS: 20.0000 mg | ORAL_TABLET | Freq: Every day | ORAL | 0 refills | Status: DC

## 2017-05-08 NOTE — Telephone Encounter (Signed)
Pt request refill lisinopril-HCTZ and simvastatin to [place]. Refilled per protocol and pt will ck her schedule and call back for annual exam prior to being out of med.

## 2017-05-27 DIAGNOSIS — H524 Presbyopia: Secondary | ICD-10-CM | POA: Diagnosis not present

## 2017-06-16 ENCOUNTER — Telehealth: Payer: Self-pay | Admitting: Family Medicine

## 2017-06-16 DIAGNOSIS — D649 Anemia, unspecified: Secondary | ICD-10-CM

## 2017-06-16 DIAGNOSIS — M8580 Other specified disorders of bone density and structure, unspecified site: Secondary | ICD-10-CM

## 2017-06-16 DIAGNOSIS — E7800 Pure hypercholesterolemia, unspecified: Secondary | ICD-10-CM

## 2017-06-16 NOTE — Telephone Encounter (Signed)
-----   Message from Eustace Pen, LPN sent at 97/01/8831  5:10 PM EDT ----- Regarding: Labs 10/3 Please place lab orders. Thank you.  [facility]

## 2017-06-17 ENCOUNTER — Ambulatory Visit (INDEPENDENT_AMBULATORY_CARE_PROVIDER_SITE_OTHER): Payer: Commercial Managed Care - HMO

## 2017-06-17 VITALS — BP 140/70 | HR 51 | Temp 98.0°F | Ht 60.5 in | Wt 123.5 lb

## 2017-06-17 DIAGNOSIS — E7800 Pure hypercholesterolemia, unspecified: Secondary | ICD-10-CM | POA: Diagnosis not present

## 2017-06-17 DIAGNOSIS — D649 Anemia, unspecified: Secondary | ICD-10-CM | POA: Diagnosis not present

## 2017-06-17 DIAGNOSIS — Z0000 Encounter for general adult medical examination without abnormal findings: Secondary | ICD-10-CM | POA: Diagnosis not present

## 2017-06-17 DIAGNOSIS — M8580 Other specified disorders of bone density and structure, unspecified site: Secondary | ICD-10-CM | POA: Diagnosis not present

## 2017-06-17 LAB — CBC WITH DIFFERENTIAL/PLATELET
BASOS PCT: 0.5 % (ref 0.0–3.0)
Basophils Absolute: 0 10*3/uL (ref 0.0–0.1)
Eosinophils Absolute: 0.1 10*3/uL (ref 0.0–0.7)
Eosinophils Relative: 2.3 % (ref 0.0–5.0)
HEMATOCRIT: 40.7 % (ref 36.0–46.0)
Hemoglobin: 14 g/dL (ref 12.0–15.0)
LYMPHS ABS: 1.7 10*3/uL (ref 0.7–4.0)
LYMPHS PCT: 26.9 % (ref 12.0–46.0)
MCHC: 34.3 g/dL (ref 30.0–36.0)
MCV: 92.1 fl (ref 78.0–100.0)
MONOS PCT: 10 % (ref 3.0–12.0)
Monocytes Absolute: 0.6 10*3/uL (ref 0.1–1.0)
NEUTROS ABS: 3.9 10*3/uL (ref 1.4–7.7)
NEUTROS PCT: 60.3 % (ref 43.0–77.0)
PLATELETS: 231 10*3/uL (ref 150.0–400.0)
RBC: 4.42 Mil/uL (ref 3.87–5.11)
RDW: 12.8 % (ref 11.5–15.5)
WBC: 6.5 10*3/uL (ref 4.0–10.5)

## 2017-06-17 LAB — LIPID PANEL
Cholesterol: 160 mg/dL (ref 0–200)
HDL: 51.4 mg/dL (ref >39.00)
LDL Cholesterol: 79 mg/dL (ref 0–99)
NonHDL: 108.23
TRIGLYCERIDES: 146 mg/dL (ref 0.0–149.0)
Total CHOL/HDL Ratio: 3
VLDL: 29.2 mg/dL (ref 0.0–40.0)

## 2017-06-17 LAB — COMPREHENSIVE METABOLIC PANEL
ALK PHOS: 82 U/L (ref 39–117)
ALT: 30 U/L (ref 0–35)
AST: 32 U/L (ref 0–37)
Albumin: 4.2 g/dL (ref 3.5–5.2)
BILIRUBIN TOTAL: 0.8 mg/dL (ref 0.2–1.2)
BUN: 16 mg/dL (ref 6–23)
CALCIUM: 10.3 mg/dL (ref 8.4–10.5)
CO2: 32 mEq/L (ref 19–32)
Chloride: 98 mEq/L (ref 96–112)
Creatinine, Ser: 0.88 mg/dL (ref 0.40–1.20)
GFR: 66.26 mL/min (ref >60.00)
Glucose, Bld: 98 mg/dL (ref 70–99)
POTASSIUM: 5 meq/L (ref 3.5–5.1)
Sodium: 135 mEq/L (ref 135–145)
TOTAL PROTEIN: 7.5 g/dL (ref 6.0–8.3)

## 2017-06-17 LAB — VITAMIN D 25 HYDROXY (VIT D DEFICIENCY, FRACTURES): VITD: 39.72 ng/mL (ref 30.00–100.00)

## 2017-06-17 NOTE — Progress Notes (Signed)
Pre visit review using our clinic review tool, if applicable. No additional management support is needed unless otherwise documented below in the visit note. 

## 2017-06-17 NOTE — Progress Notes (Signed)
PCP notes:   Health maintenance:  No gaps identified  Abnormal screenings:   None  Patient concerns:   None  Nurse concerns:  None  Next PCP appt:   06/19/17 @ 1000

## 2017-06-17 NOTE — Progress Notes (Signed)
I reviewed health advisor's note, was available for consultation, and agree with documentation and plan.  

## 2017-06-17 NOTE — Progress Notes (Signed)
Subjective:   Alexandria Mcdowell is a 76 y.o. female who presents for Medicare Annual (Subsequent) preventive examination.  Review of Systems:  N/A Cardiac Risk Factors include: advanced age (>18men, >62 women);dyslipidemia;hypertension     Objective:     Vitals: BP 140/70 (BP Location: Right Arm, Patient Position: Sitting, Cuff Size: Normal)   Pulse (!) 51   Temp 98 F (36.7 C) (Oral)   Ht 5' 0.5" (1.537 m) Comment: no shoes  Wt 123 lb 8 oz (56 kg)   SpO2 96%   BMI 23.72 kg/m   Body mass index is 23.72 kg/m.   Tobacco History  Smoking Status  . Never Smoker  Smokeless Tobacco  . Never Used     Counseling given: No   Past Medical History:  Diagnosis Date  . Allergy   . Anemia   . Hyperlipidemia   . Hypertension   . Migraine    Past Surgical History:  Procedure Laterality Date  . APPENDECTOMY    . BREAST BIOPSY    . CATARACT EXTRACTION  05/05/13   right eye   . COLONOSCOPY  2006  . [place]  . WISDOM TOOTH EXTRACTION     Family History  Problem Relation Age of Onset  . Hypertension Mother   . Hyperlipidemia Mother   . Cancer Mother        breast/colon  . Heart disease Mother   . Diabetes Father   . Emphysema Father   . Hyperlipidemia Sister   . Hypertension Sister   . Hypothyroidism Sister    History  Sexual Activity  . Sexual activity: Yes    Outpatient Encounter Prescriptions as of 06/17/2017  Medication Sig  . atenolol (TENORMIN) 50 MG tablet Take 1 tablet (50 mg total) by mouth daily.  . Calcium Carbonate (CALCIUM 600 PO) Take 2 capsules by mouth daily.   Marland Kitchen lisinopril-hydrochlorothiazide (PRINZIDE,ZESTORETIC) 10-12.5 MG tablet Take 1 tablet by mouth daily.  . Menthol-Methyl Salicylate (ICY HOT EXTRA STRENGTH EX) Apply 1 application topically daily as needed.   . Multiple Vitamins-Minerals (MULTIVITAMIN WITH MINERALS) tablet Take 1 tablet by mouth daily.  . Omega-3 Fatty Acids (FISH OIL) 1200 MG CAPS Take 1 capsule  by mouth daily.  . simvastatin (ZOCOR) 20 MG tablet Take 1 tablet (20 mg total) by mouth daily.  . sodium chloride (OCEAN) 0.65 % SOLN nasal spray Place 1 spray into both nostrils as needed for congestion.   No facility-administered encounter medications on file as of 06/17/2017.     Activities of Daily Living In your present state of health, do you have any difficulty performing the following activities: 06/17/2017  Hearing? Y  Vision? N  Difficulty concentrating or making decisions? N  Walking or climbing stairs? N  Dressing or bathing? N  Doing errands, shopping? N  Preparing Food and eating ? N  Using the Toilet? N  In the past six months, have you accidently leaked urine? N  Do you have problems with loss of bowel control? N  Managing your Medications? N  Managing your Finances? N  Housekeeping or managing your Housekeeping? N  Some recent data might be hidden    Patient Care Team: Jinny Sanders, MD as PCP - General (Family Medicine) Lavonna Monarch, MD (Dermatology) Madilyn Hook, OD as Consulting Physician (Optometry)    Assessment:    Hearing Screening Comments: Bilateral hearing aids Vision Screening Comments: Last vision exam in Sept 2018 with Dr. Smith/[place]  Activities and Dietary recommendations Current Exercise Habits: Home exercise routine, Type of exercise: strength training/weights;stretching;Other - see comments (stationary bike), Time (Minutes): 45, Frequency (Times/Week): 5, Weekly Exercise (Minutes/Week): 225, Intensity: Moderate, Exercise limited by: None identified  Goals    . Increase physical activity          Starting 06/17/17, I will continue to exercise for at least 30-45 min 4-5 days per week.       Fall Risk Fall Risk  06/17/2017 05/20/2016 05/18/2015 05/12/2014  Falls in the past year? No No No No   Depression Screen PHQ 2/9 Scores 06/17/2017 05/20/2016 05/18/2015 12/26/2014  PHQ - 2 Score 0 0 0 0  PHQ- 9 Score 0 - - -      Cognitive Function MMSE - Mini Mental State Exam 06/17/2017 05/20/2016  Orientation to time 5 5  Orientation to Place 5 5  Registration 3 3  Attention/ Calculation 0 0  Recall 3 3  Language- name 2 objects 0 0  Language- repeat 1 1  Language- follow 3 step command 3 3  Language- read & follow direction 0 0  Write a sentence 0 0  Copy design 0 0  Total score 20 20     PLEASE NOTE: A Mini-Cog screen was completed. Maximum score is 20. A value of 0 denotes this part of Folstein MMSE was not completed or the patient failed this part of the Mini-Cog screening.   Mini-Cog Screening Orientation to Time - Max 5 pts Orientation to Place - Max 5 pts Registration - Max 3 pts Recall - Max 3 pts Language Repeat - Max 1 pts Language Follow 3 Step Command - Max 3 pts     Immunization History  Administered Date(s) Administered  . Influenza Split 06/19/2011, 06/22/2012  . Influenza Whole 06/29/2007, 06/20/2008, 06/13/2010  . Influenza, High Dose Seasonal PF 06/06/2017  . Influenza,inj,Quad PF,6+ Mos 05/17/2013, 05/12/2014, 05/18/2015, 05/20/2016  . Pneumococcal Conjugate-13 05/18/2015  . Pneumococcal Polysaccharide-23 02/23/2007  . Td 01/30/2003  . Tetanus 05/10/2013   Screening Tests Health Maintenance  Topic Date Due  . TETANUS/TDAP  05/11/2023  . INFLUENZA VACCINE  Completed  . DEXA SCAN  Completed  . PNA vac Low Risk Adult  Completed      Plan:     I have personally reviewed and addressed the Medicare Annual Wellness questionnaire and have noted the following in the patient's chart:  A. Medical and social history B. Use of alcohol, tobacco or illicit drugs  C. Current medications and supplements D. Functional ability and status E.  Nutritional status F.  Physical activity G. Advance directives H. List of other physicians I.  Hospitalizations, surgeries, and ER visits in previous 12 months J.  [place] to include hearing, vision, cognitive,  depression L. Referrals and appointments - none  In addition, I have reviewed and discussed with patient certain preventive protocols, quality metrics, and best practice recommendations. A written personalized care plan for preventive services as well as general preventive health recommendations were provided to patient.  See attached scanned questionnaire for additional information.   Signed,   Lindell Noe, MHA, BS, LPN Health Coach

## 2017-06-17 NOTE — Patient Instructions (Signed)
Ms. Kishbaugh , Thank you for taking time to come for your Medicare Wellness Visit. I appreciate your ongoing commitment to your health goals. Please review the following plan we discussed and let me know if I can assist you in the future.   These are the goals we discussed: Goals    . Increase physical activity          Starting 06/17/17, I will continue to exercise for at least 30-45 min 4-5 days per week.        This is a list of the screening recommended for you and due dates:  Health Maintenance  Topic Date Due  . Tetanus Vaccine  05/11/2023  . Flu Shot  Completed  . DEXA scan (bone density measurement)  Completed  . Pneumonia vaccines  Completed   Preventive Care for Adults  A healthy lifestyle and preventive care can promote health and wellness. Preventive health guidelines for adults include the following key practices.  . A routine yearly physical is a good way to check with your health care provider about your health and preventive screening. It is a chance to share any concerns and updates on your health and to receive a thorough exam.  . Visit your dentist for a routine exam and preventive care every 6 months. Brush your teeth twice a day and floss once a day. Good oral hygiene prevents tooth decay and gum disease.  . The frequency of eye exams is based on your age, health, family medical history, use  of contact lenses, and other factors. Follow your health care provider's ecommendations for frequency of eye exams.  . Eat a healthy diet. Foods like vegetables, fruits, whole grains, low-fat dairy products, and lean protein foods contain the nutrients you need without too many calories. Decrease your intake of foods high in solid fats, added sugars, and salt. Eat the right amount of calories for you. Get information about a proper diet from your health care provider, if necessary.  . Regular physical exercise is one of the most important things you can do for your health.  Most adults should get at least 150 minutes of moderate-intensity exercise (any activity that increases your heart rate and causes you to sweat) each week. In addition, most adults need muscle-strengthening exercises on 2 or more days a week.  Silver Sneakers may be a benefit available to you. To determine eligibility, you may visit the website: www.silversneakers.com or contact program at 601-490-4696 Mon-Fri between 8AM-8PM.   . Maintain a healthy weight. The body mass index (BMI) is a screening tool to identify possible weight problems. It provides an estimate of body fat based on height and weight. Your health care provider can find your BMI and can help you achieve or maintain a healthy weight.   For adults 20 years and older: ? A BMI below 18.5 is considered underweight. ? A BMI of 18.5 to 24.9 is normal. ? A BMI of 25 to 29.9 is considered overweight. ? A BMI of 30 and above is considered obese.   . Maintain normal blood lipids and cholesterol levels by exercising and minimizing your intake of saturated fat. Eat a balanced diet with plenty of fruit and vegetables. Blood tests for lipids and cholesterol should begin at age 38 and be repeated every 5 years. If your lipid or cholesterol levels are high, you are over 50, or you are at high risk for heart disease, you may need your cholesterol levels checked more frequently. Ongoing high lipid  and cholesterol levels should be treated with medicines if diet and exercise are not working.  . If you smoke, find out from your health care provider how to quit. If you do not use tobacco, please do not start.  . If you choose to drink alcohol, please do not consume more than 2 drinks per day. One drink is considered to be 12 ounces (355 mL) of beer, 5 ounces (148 mL) of wine, or 1.5 ounces (44 mL) of liquor.  . If you are 73-55 years old, ask your health care provider if you should take aspirin to prevent strokes.  . Use sunscreen. Apply sunscreen  liberally and repeatedly throughout the day. You should seek shade when your shadow is shorter than you. Protect yourself by wearing long sleeves, pants, a wide-brimmed hat, and sunglasses year round, whenever you are outdoors.  . Once a month, do a whole body skin exam, using a mirror to look at the skin on your back. Tell your health care provider of new moles, moles that have irregular borders, moles that are larger than a pencil eraser, or moles that have changed in shape or color.

## 2017-06-19 ENCOUNTER — Ambulatory Visit (INDEPENDENT_AMBULATORY_CARE_PROVIDER_SITE_OTHER): Payer: Commercial Managed Care - HMO | Admitting: Family Medicine

## 2017-06-19 ENCOUNTER — Encounter: Payer: Self-pay | Admitting: Family Medicine

## 2017-06-19 VITALS — BP 140/70 | HR 51 | Temp 97.7°F | Ht 60.5 in | Wt 124.2 lb

## 2017-06-19 DIAGNOSIS — I10 Essential (primary) hypertension: Secondary | ICD-10-CM | POA: Diagnosis not present

## 2017-06-19 DIAGNOSIS — Z0000 Encounter for general adult medical examination without abnormal findings: Secondary | ICD-10-CM | POA: Diagnosis not present

## 2017-06-19 DIAGNOSIS — E7800 Pure hypercholesterolemia, unspecified: Secondary | ICD-10-CM | POA: Diagnosis not present

## 2017-06-19 MED ORDER — LISINOPRIL-HYDROCHLOROTHIAZIDE 10-12.5 MG PO TABS: 1.0000 | ORAL_TABLET | Freq: Every day | ORAL | 3 refills | Status: DC

## 2017-06-19 MED ORDER — SIMVASTATIN 20 MG PO TABS: 20.0000 mg | ORAL_TABLET | Freq: Every day | ORAL | 3 refills | Status: DC

## 2017-06-19 MED ORDER — ATENOLOL 50 MG PO TABS: 50.0000 mg | ORAL_TABLET | Freq: Every day | ORAL | 3 refills | Status: DC

## 2017-06-19 NOTE — Progress Notes (Signed)
Subjective:    Patient ID: Alexandria Mcdowell, female    DOB: 10-Oct-1940, 76 y.o.   MRN: [id number]  HPI  The patient presents for complete physical and review of chronic health problems.   Doing well overall.  The patient saw Candis Musa, LPN for medicare wellness. Note reviewed in detail and important notes copied below. Health maintenance: No gaps identified Abnormal screenings:  None Patient concerns:  None  Today 06/19/17  Elevated Cholesterol:  Good control on simvastatin, trig down, HDL up. Lab Results  Component Value Date   CHOL 160 06/17/2017   HDL 51.40 06/17/2017   LDLCALC 79 06/17/2017   LDLDIRECT 65.0 05/20/2016   TRIG 146.0 06/17/2017   CHOLHDL 3 06/17/2017  Using medications without problems: Muscle aches:  Diet compliance: Healthy diet, fresh veggies Exercise:  [employer] 5 days a week Other complaints: Body mass index is 23.87 kg/m.   Hypertension:   Good control on atenolol and lisinopril HCTZ Using medication without problems or lightheadedness: none Chest pain with exertion:none Edema:none Short of breath:none Average home BPs: 108-127/63-83 Other issues:  Social History /Family History/Past Medical History reviewed in detail and updated in EMR if needed. Blood pressure 140/70, pulse (!) 51, temperature 97.7 F (36.5 C), temperature source Oral, height 5' 0.5" (1.537 m), weight 124 lb 4 oz (56.4 kg).  Review of Systems  Constitutional: Negative for fatigue and fever.  HENT: Negative for congestion.   Eyes: Negative for pain.  Respiratory: Negative for cough and shortness of breath.   Cardiovascular: Negative for chest pain, palpitations and leg swelling.  Gastrointestinal: Negative for abdominal pain.  Genitourinary: Negative for dysuria and vaginal bleeding.  Musculoskeletal: Negative for back pain.  Neurological: Negative for syncope, light-headedness and headaches.  Psychiatric/Behavioral: Negative for dysphoric mood.         Objective:   Physical Exam  Constitutional: Vital signs are normal. She appears well-developed and well-nourished. She is cooperative.  Non-toxic appearance. She does not appear ill. No distress.  HENT:  Head: Normocephalic.  Right Ear: Hearing, tympanic membrane, external ear and ear canal normal.  Left Ear: Hearing, tympanic membrane, external ear and ear canal normal.  Nose: Nose normal.  Eyes: Pupils are equal, round, and reactive to light. Conjunctivae, EOM and lids are normal. Lids are everted and swept, no foreign bodies found.  Neck: Trachea normal and normal range of motion. Neck supple. Carotid bruit is not present. No thyroid mass and no thyromegaly present.  Cardiovascular: Normal rate, regular rhythm, S1 normal, S2 normal, normal heart sounds and intact distal pulses.  Exam reveals no gallop.   No murmur heard. Pulmonary/Chest: Effort normal and breath sounds normal. No respiratory distress. She has no wheezes. She has no rhonchi. She has no rales.  Abdominal: Soft. Normal appearance and bowel sounds are normal. She exhibits no distension, no fluid wave, no abdominal bruit and no mass. There is no hepatosplenomegaly. There is no tenderness. There is no rebound, no guarding and no CVA tenderness. No hernia.  Lymphadenopathy:    She has no cervical adenopathy.    She has no axillary adenopathy.  Neurological: She is alert. She has normal strength. No cranial nerve deficit or sensory deficit.  Skin: Skin is warm, dry and intact. No rash noted.  Psychiatric: Her speech is normal and behavior is normal. Judgment normal. Her mood appears not anxious. Cognition and memory are normal. She does not exhibit a depressed mood.          Assessment &  Plan:  The patient's preventative maintenance and recommended screening tests for an annual wellness exam were reviewed in full today. Brought up to date unless services declined.  Counselled on the importance of diet, exercise, and its  role in overall health and mortality. The patient's FH and SH was reviewed, including their home life, tobacco status, and drug and alcohol status.   Vaccines: Uptodate , not interested in shingles vaccine. Mammo: nml 03/2017, Plans q2 years. Mother breast cancer. Colon: Dr Amedeo Plenty, 2016 nml,  Mother with colon cancer repeat 10 year DEXA: stable osteopenia 03/2016, q 2 year PAP/DVE : PAP not indicated, no family hx of uterine ovarian cancer.  No concerns. No DVE indicated.  No smoker

## 2017-06-19 NOTE — Assessment & Plan Note (Signed)
Some white coat HTN.Kermit Balo control at home

## 2017-06-19 NOTE — Assessment & Plan Note (Signed)
Good control on current dose of simvastatin.

## 2017-11-25 ENCOUNTER — Encounter: Payer: Self-pay | Admitting: Family Medicine

## 2017-11-25 DIAGNOSIS — N950 Postmenopausal bleeding: Secondary | ICD-10-CM | POA: Diagnosis not present

## 2017-11-26 ENCOUNTER — Ambulatory Visit (INDEPENDENT_AMBULATORY_CARE_PROVIDER_SITE_OTHER): Payer: Medicare HMO | Admitting: Internal Medicine

## 2017-11-26 ENCOUNTER — Encounter: Payer: Self-pay | Admitting: Internal Medicine

## 2017-11-26 VITALS — BP 138/82 | HR 66 | Temp 97.7°F | Wt 128.0 lb

## 2017-11-26 DIAGNOSIS — N950 Postmenopausal bleeding: Secondary | ICD-10-CM

## 2017-11-26 DIAGNOSIS — R8290 Unspecified abnormal findings in urine: Secondary | ICD-10-CM | POA: Diagnosis not present

## 2017-11-26 DIAGNOSIS — N814 Uterovaginal prolapse, unspecified: Secondary | ICD-10-CM | POA: Diagnosis not present

## 2017-11-26 LAB — POC URINALSYSI DIPSTICK (AUTOMATED)
Bilirubin, UA: NEGATIVE
Glucose, UA: NEGATIVE
KETONES UA: NEGATIVE
Nitrite, UA: NEGATIVE
PH UA: 6 (ref 5.0–8.0)
SPEC GRAV UA: 1.015 (ref 1.010–1.025)
Urobilinogen, UA: 0.2 E.U./dL

## 2017-11-26 NOTE — Addendum Note (Signed)
Addended by: Lurlean Nanny on: 11/26/2017 10:50 AM   Modules accepted: Orders

## 2017-11-26 NOTE — Patient Instructions (Signed)
Postmenopausal Bleeding Postmenopausal bleeding is any bleeding after menopause. Menopause is when a woman's period stops. Any type of bleeding after menopause is concerning. It should be checked by your doctor. Any treatment will depend on the cause. Follow these instructions at home: Watch your condition for any changes.  Avoid the use of tampons and douches as told by your doctor.  Change your pads often.  Get regular pelvic exams and Pap tests.  Keep all appointments for tests as told by your doctor.  Contact a doctor if:  Your bleeding lasts for more than 1 week.  You have belly (abdominal) pain.  You have bleeding after sex (intercourse). Get help right away if:  You have a fever, chills, a headache, dizziness, muscle aches, and bleeding.  You have strong pain with bleeding.  You have clumps of blood (blood clots) coming from your vagina.  You have bleeding and need more than 1 pad an hour.  You feel like you are going to pass out (faint). This information is not intended to replace advice given to you by your health care provider. Make sure you discuss any questions you have with your health care provider. Document Released: 06/10/2008 Document Revised: 02/07/2016 Document Reviewed: 03/31/2013 Elsevier Interactive Patient Education  2017 Elsevier Inc.  

## 2017-11-26 NOTE — Progress Notes (Signed)
Subjective:    Patient ID: Alexandria Mcdowell, female    DOB: [birth year], 77 y.o.   MRN: [id number]  HPI  Pt presents to the clinic today with c/o vaginal bleeding. She reports this started 2 days ago. The bleeding is bright red. She denies abdominal pain/pelvic pain. She denies vaginal discharge or odor. She denies urinary frequency, dysuria or blood in her urine. She reports she did have some vaginal bleeding 1 year ago that has stopped. She never go worked up for this. She has not tried anything OTC for her symptoms.   Review of Systems   Past Medical History:  Diagnosis Date  . Allergy   . Anemia   . Hyperlipidemia   . Hypertension   . Migraine     Current Outpatient Medications  Medication Sig Dispense Refill  . atenolol (TENORMIN) 50 MG tablet Take 1 tablet (50 mg total) by mouth daily. 90 tablet 3  . Calcium Carbonate (CALCIUM 600 PO) Take 2 capsules by mouth daily.     Marland Kitchen lisinopril-hydrochlorothiazide (PRINZIDE,ZESTORETIC) 10-12.5 MG tablet Take 1 tablet by mouth daily. 90 tablet 3  . Menthol-Methyl Salicylate (ICY HOT EXTRA STRENGTH EX) Apply 1 application topically daily as needed.     . Multiple Vitamins-Minerals (MULTIVITAMIN WITH MINERALS) tablet Take 1 tablet by mouth daily.    . Omega-3 Fatty Acids (FISH OIL) 1200 MG CAPS Take 1 capsule by mouth daily.    . simvastatin (ZOCOR) 20 MG tablet Take 1 tablet (20 mg total) by mouth daily. 90 tablet 3  . sodium chloride (OCEAN) 0.65 % SOLN nasal spray Place 1 spray into both nostrils as needed for congestion.     No current facility-administered medications for this visit.     Allergies  Allergen Reactions  . Clindamycin Hcl Nausea And Vomiting    nervous  . Codeine Nausea And Vomiting  . Erythromycin Nausea And Vomiting  . Tetracycline Nausea And Vomiting  . Penicillins Rash  . Sulfonamide Derivatives Nausea And Vomiting and Rash    Family History  Problem Relation Age of Onset  . Hypertension Mother   .  Hyperlipidemia Mother   . Cancer Mother        breast/colon  . Heart disease Mother   . Diabetes Father   . Emphysema Father   . Hyperlipidemia Sister   . Hypertension Sister   . Hypothyroidism Sister     Social History   Socioeconomic History  . Marital status: Married    Spouse name: Not on file  . Number of children: Not on file  . Years of education: Not on file  . Highest education level: Not on file  Social Needs  . Financial resource strain: Not on file  . Food insecurity - worry: Not on file  . Food insecurity - inability: Not on file  . Transportation needs - medical: Not on file  . Transportation needs - non-medical: Not on file  Occupational History  . Not on file  Tobacco Use  . Smoking status: Never Smoker  . Smokeless tobacco: Never Used  Substance and Sexual Activity  . Alcohol use: No  . Drug use: No  . Sexual activity: Yes  Other Topics Concern  . 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 Laymen's guide and forms provided.    Reviewed Aug '15      Exercsie:5 days a week, treadmill   Diet: healthy, weight watchers.     Constitutional: Denies fever, malaise, fatigue, headache or abrupt weight changes.  Gastrointestinal: Denies abdominal pain, bloating, constipation, diarrhea or blood in the stool.  GU: Pt reports vaginal bleeding. Denies urgency, frequency, pain with urination, burning sensation, blood in urine, odor or discharge.   No other specific complaints in a complete review of systems (except as listed in HPI above).   Objective:   Physical Exam   BP 138/82   Pulse 66   Temp 97.7 F (36.5 C) (Oral)   Wt 128 lb (58.1 kg)   SpO2 97%   BMI 24.59 kg/m  Wt Readings from Last 3  Encounters:  11/26/17 128 lb (58.1 kg)  06/19/17 124 lb 4 oz (56.4 kg)  06/17/17 123 lb 8 oz (56 kg)    General: Appears her stated age, well developed, well nourished in NAD. Abdomen: Soft and nontender. Normal bowel sounds. No distention or masses noted. Liver, spleen and kidneys non palpable. Pelvic: She has a bleeding appendage noted at the vaginal opening. Urethra normal.   BMET    Component Value Date/Time   NA 135 06/17/2017 1024   K 5.0 06/17/2017 1024   CL 98 06/17/2017 1024   CO2 32 06/17/2017 1024   GLUCOSE 98 06/17/2017 1024   BUN 16 06/17/2017 1024   CREATININE 0.88 06/17/2017 1024   CALCIUM 10.3 06/17/2017 1024   GFRNONAA 82 (L) 06/07/2014 0357   GFRAA >90 06/07/2014 0357    Lipid Panel     Component Value Date/Time   CHOL 160 06/17/2017 1024   TRIG 146.0 06/17/2017 1024   HDL 51.40 06/17/2017 1024   CHOLHDL 3 06/17/2017 1024   VLDL 29.2 06/17/2017 1024   LDLCALC 79 06/17/2017 1024    CBC    Component Value Date/Time   WBC 6.5 06/17/2017 1024   RBC 4.42 06/17/2017 1024   HGB 14.0 06/17/2017 1024   HCT 40.7 06/17/2017 1024   PLT 231.0 06/17/2017 1024   MCV 92.1 06/17/2017 1024   MCH 31.5 06/07/2014 0357   MCHC 34.3 06/17/2017 1024   RDW 12.8 06/17/2017 1024   LYMPHSABS 1.7 06/17/2017 1024   MONOABS 0.6 06/17/2017 1024   EOSABS 0.1 06/17/2017 1024   BASOSABS 0.0 06/17/2017 1024    Hgb A1C No results found for: HGBA1C         Assessment & Plan:   Postmenopausal Bleeding, with Prolapsed Mass:  Urinalysis: 3+ leuks, 3+ blood Will send urine culture Referral to GYN for further evaluation  Return precautions discussed Webb Silversmith, NP

## 2017-11-27 ENCOUNTER — Encounter: Payer: Self-pay | Admitting: Family Medicine

## 2017-11-27 DIAGNOSIS — Z01419 Encounter for gynecological examination (general) (routine) without abnormal findings: Secondary | ICD-10-CM | POA: Diagnosis not present

## 2017-11-27 DIAGNOSIS — N950 Postmenopausal bleeding: Secondary | ICD-10-CM | POA: Diagnosis not present

## 2017-11-27 DIAGNOSIS — Z779 Other contact with and (suspected) exposures hazardous to health: Secondary | ICD-10-CM | POA: Diagnosis not present

## 2017-11-28 LAB — URINE CULTURE
MICRO NUMBER: 90325588
SPECIMEN QUALITY: ADEQUATE

## 2017-12-14 ENCOUNTER — Encounter: Payer: Self-pay | Admitting: Family Medicine

## 2017-12-14 DIAGNOSIS — N950 Postmenopausal bleeding: Secondary | ICD-10-CM | POA: Diagnosis not present

## 2018-04-16 DIAGNOSIS — N958 Other specified menopausal and perimenopausal disorders: Secondary | ICD-10-CM | POA: Diagnosis not present

## 2018-04-16 DIAGNOSIS — N841 Polyp of cervix uteri: Secondary | ICD-10-CM | POA: Diagnosis not present

## 2018-04-16 DIAGNOSIS — M8588 Other specified disorders of bone density and structure, other site: Secondary | ICD-10-CM | POA: Diagnosis not present

## 2018-04-16 DIAGNOSIS — N950 Postmenopausal bleeding: Secondary | ICD-10-CM | POA: Diagnosis not present

## 2018-04-16 DIAGNOSIS — Z1231 Encounter for screening mammogram for malignant neoplasm of breast: Secondary | ICD-10-CM | POA: Diagnosis not present

## 2018-04-28 ENCOUNTER — Telehealth: Payer: Self-pay | Admitting: *Deleted

## 2018-04-28 NOTE — Telephone Encounter (Signed)
Flu shots arrived today but not sure if things will be set up in Epic yet.  If so we will be happy to give flu shot at her visit on Friday.

## 2018-04-28 NOTE — Telephone Encounter (Signed)
Copied from [place] [id number]. Topic: Inquiry >> Apr 28, 2018  8:37 AM Alexandria Mcdowell, NT wrote: Reason for CRM: patient is calling and has a appointment on 04/30/18. Patient is requesting to get her flu shot at that time as well.

## 2018-04-30 ENCOUNTER — Encounter: Payer: Self-pay | Admitting: Family Medicine

## 2018-04-30 ENCOUNTER — Ambulatory Visit (INDEPENDENT_AMBULATORY_CARE_PROVIDER_SITE_OTHER): Payer: Medicare HMO | Admitting: Family Medicine

## 2018-04-30 VITALS — BP 140/70 | HR 50 | Temp 98.5°F | Ht 60.5 in | Wt 132.0 lb

## 2018-04-30 DIAGNOSIS — E7800 Pure hypercholesterolemia, unspecified: Secondary | ICD-10-CM

## 2018-04-30 DIAGNOSIS — D649 Anemia, unspecified: Secondary | ICD-10-CM | POA: Diagnosis not present

## 2018-04-30 DIAGNOSIS — M8580 Other specified disorders of bone density and structure, unspecified site: Secondary | ICD-10-CM

## 2018-04-30 NOTE — Patient Instructions (Signed)
Make sure to take vit D and Ca 2 times daily.  Keep up with the weight bearing exercise 5 days a week.

## 2018-04-30 NOTE — Assessment & Plan Note (Signed)
Not clearly idfferent T score but on different machine at GYN.Discussed treatment options of following. Will eval  TSH, vit D and PTH.. COnitnue vit D Ca and weight bearing exercise.

## 2018-04-30 NOTE — Progress Notes (Signed)
   Subjective:    Patient ID: Alexandria Mcdowell, female    DOB: 06/03/1941, 77 y.o.   MRN: [id number]  HPI  77 year old female pt presents for follow up recent bone density at GYN office.  T 0.1 in spine, femoral necks -2.2 Osteopenia range. Previous have been normal.  Already taking Ca 600 mg/ vit D 400IU  twice daily,  5 days a week at [facility]..treadmill or bicycle. Review of Systems  Constitutional: Negative for fatigue and fever.  HENT: Negative for ear pain.   Eyes: Negative for pain.  Respiratory: Negative for chest tightness and shortness of breath.   Cardiovascular: Negative for chest pain, palpitations and leg swelling.  Gastrointestinal: Negative for abdominal pain.  Genitourinary: Negative for dysuria.       Objective:   Physical Exam  Constitutional: Vital signs are normal. She appears well-developed and well-nourished. She is cooperative.  Non-toxic appearance. She does not appear ill. No distress.  HENT:  Head: Normocephalic.  Right Ear: Hearing, tympanic membrane, external ear and ear canal normal. Tympanic membrane is not erythematous, not retracted and not bulging.  Left Ear: Hearing, tympanic membrane, external ear and ear canal normal. Tympanic membrane is not erythematous, not retracted and not bulging.  Nose: No mucosal edema or rhinorrhea. Right sinus exhibits no maxillary sinus tenderness and no frontal sinus tenderness. Left sinus exhibits no maxillary sinus tenderness and no frontal sinus tenderness.  Mouth/Throat: Uvula is midline, oropharynx is clear and moist and mucous membranes are normal.  Eyes: Pupils are equal, round, and reactive to light. Conjunctivae, EOM and lids are normal. Lids are everted and swept, no foreign bodies found.  Neck: Trachea normal and normal range of motion. Neck supple. Carotid bruit is not present. No thyroid mass and no thyromegaly present.  Cardiovascular: Normal rate, regular rhythm, S1 normal, S2 normal, normal heart sounds,  intact distal pulses and normal pulses. Exam reveals no gallop and no friction rub.  No murmur heard. Pulmonary/Chest: Effort normal and breath sounds normal. No tachypnea. No respiratory distress. She has no decreased breath sounds. She has no wheezes. She has no rhonchi. She has no rales.  Abdominal: Soft. Normal appearance and bowel sounds are normal. There is no tenderness.  Neurological: She is alert.  Skin: Skin is warm, dry and intact. No rash noted.  Psychiatric: Her speech is normal and behavior is normal. Judgment and thought content normal. Her mood appears not anxious. Cognition and memory are normal. She does not exhibit a depressed mood.          Assessment & Plan:

## 2018-06-07 DIAGNOSIS — H524 Presbyopia: Secondary | ICD-10-CM | POA: Diagnosis not present

## 2018-06-11 DIAGNOSIS — I10 Essential (primary) hypertension: Secondary | ICD-10-CM | POA: Diagnosis not present

## 2018-06-11 DIAGNOSIS — D649 Anemia, unspecified: Secondary | ICD-10-CM | POA: Diagnosis not present

## 2018-06-11 DIAGNOSIS — M8588 Other specified disorders of bone density and structure, other site: Secondary | ICD-10-CM | POA: Diagnosis not present

## 2018-06-11 DIAGNOSIS — M8580 Other specified disorders of bone density and structure, unspecified site: Secondary | ICD-10-CM | POA: Diagnosis not present

## 2018-06-23 ENCOUNTER — Ambulatory Visit (INDEPENDENT_AMBULATORY_CARE_PROVIDER_SITE_OTHER): Payer: Medicare HMO

## 2018-06-23 VITALS — BP 122/70 | HR 50 | Temp 98.4°F | Ht 61.5 in | Wt 127.5 lb

## 2018-06-23 DIAGNOSIS — Z0000 Encounter for general adult medical examination without abnormal findings: Secondary | ICD-10-CM

## 2018-06-23 DIAGNOSIS — D649 Anemia, unspecified: Secondary | ICD-10-CM | POA: Diagnosis not present

## 2018-06-23 DIAGNOSIS — E7800 Pure hypercholesterolemia, unspecified: Secondary | ICD-10-CM | POA: Diagnosis not present

## 2018-06-23 DIAGNOSIS — M8580 Other specified disorders of bone density and structure, unspecified site: Secondary | ICD-10-CM | POA: Diagnosis not present

## 2018-06-23 LAB — COMPREHENSIVE METABOLIC PANEL
ALBUMIN: 4.6 g/dL (ref 3.5–5.2)
ALK PHOS: 90 U/L (ref 39–117)
ALT: 28 U/L (ref 0–35)
AST: 32 U/L (ref 0–37)
BILIRUBIN TOTAL: 0.7 mg/dL (ref 0.2–1.2)
BUN: 15 mg/dL (ref 6–23)
CO2: 32 mEq/L (ref 19–32)
CREATININE: 0.93 mg/dL (ref 0.40–1.20)
Calcium: 11 mg/dL — ABNORMAL HIGH (ref 8.4–10.5)
Chloride: 100 mEq/L (ref 96–112)
GFR: 62 mL/min (ref >60.00)
Glucose, Bld: 99 mg/dL (ref 70–99)
Potassium: 4.3 mEq/L (ref 3.5–5.1)
SODIUM: 138 meq/L (ref 135–145)
TOTAL PROTEIN: 8.1 g/dL (ref 6.0–8.3)

## 2018-06-23 LAB — CBC WITH DIFFERENTIAL/PLATELET
BASOS PCT: 0.5 % (ref 0.0–3.0)
Basophils Absolute: 0 10*3/uL (ref 0.0–0.1)
EOS PCT: 3 % (ref 0.0–5.0)
Eosinophils Absolute: 0.2 10*3/uL (ref 0.0–0.7)
HEMATOCRIT: 41.2 % (ref 36.0–46.0)
HEMOGLOBIN: 14.5 g/dL (ref 12.0–15.0)
LYMPHS PCT: 27.8 % (ref 12.0–46.0)
Lymphs Abs: 1.6 10*3/uL (ref 0.7–4.0)
MCHC: 35.1 g/dL (ref 30.0–36.0)
MCV: 90.6 fl (ref 78.0–100.0)
MONO ABS: 0.5 10*3/uL (ref 0.1–1.0)
Monocytes Relative: 8.2 % (ref 3.0–12.0)
Neutro Abs: 3.4 10*3/uL (ref 1.4–7.7)
Neutrophils Relative %: 60.5 % (ref 43.0–77.0)
Platelets: 260 10*3/uL (ref 150.0–400.0)
RBC: 4.55 Mil/uL (ref 3.87–5.11)
RDW: 13 % (ref 11.5–15.5)
WBC: 5.7 10*3/uL (ref 4.0–10.5)

## 2018-06-23 LAB — LIPID PANEL
CHOLESTEROL: 157 mg/dL (ref 0–200)
HDL: 47.7 mg/dL (ref >39.00)
NonHDL: 108.84
Total CHOL/HDL Ratio: 3
Triglycerides: 231 mg/dL — ABNORMAL HIGH (ref 0.0–149.0)
VLDL: 46.2 mg/dL — AB (ref 0.0–40.0)

## 2018-06-23 LAB — VITAMIN D 25 HYDROXY (VIT D DEFICIENCY, FRACTURES): VITD: 37.7 ng/mL (ref 30.00–100.00)

## 2018-06-23 LAB — TSH: TSH: 0.98 u[IU]/mL (ref 0.35–4.50)

## 2018-06-23 LAB — LDL CHOLESTEROL, DIRECT: LDL DIRECT: 65 mg/dL

## 2018-06-23 NOTE — Progress Notes (Signed)
I reviewed health advisor's note, was available for consultation, and agree with documentation and plan.   Signed,  Tamanna Whitson T. Arvid Marengo, MD  

## 2018-06-23 NOTE — Patient Instructions (Signed)
Alexandria Mcdowell , Thank you for taking time to come for your Medicare Wellness Visit. I appreciate your ongoing commitment to your health goals. Please review the following plan we discussed and let me know if I can assist you in the future.   These are the goals we discussed: Goals    . Increase physical activity     Starting 06/23/2018, I will continue to exercise for at least 30-45 min 5 days per week.        This is a list of the screening recommended for you and due dates:  Health Maintenance  Topic Date Due  . Tetanus Vaccine  05/11/2023  . Flu Shot  Completed  . DEXA scan (bone density measurement)  Completed  . Pneumonia vaccines  Completed   Preventive Care for Adults  A healthy lifestyle and preventive care can promote health and wellness. Preventive health guidelines for adults include the following key practices.  . A routine yearly physical is a good way to check with your health care provider about your health and preventive screening. It is a chance to share any concerns and updates on your health and to receive a thorough exam.  . Visit your dentist for a routine exam and preventive care every 6 months. Brush your teeth twice a day and floss once a day. Good oral hygiene prevents tooth decay and gum disease.  . The frequency of eye exams is based on your age, health, family medical history, use  of contact lenses, and other factors. Follow your health care provider's recommendations for frequency of eye exams.  . Eat a healthy diet. Foods like vegetables, fruits, whole grains, low-fat dairy products, and lean protein foods contain the nutrients you need without too many calories. Decrease your intake of foods high in solid fats, added sugars, and salt. Eat the right amount of calories for you. Get information about a proper diet from your health care provider, if necessary.  . Regular physical exercise is one of the most important things you can do for your health. Most  adults should get at least 150 minutes of moderate-intensity exercise (any activity that increases your heart rate and causes you to sweat) each week. In addition, most adults need muscle-strengthening exercises on 2 or more days a week.  Silver Sneakers may be a benefit available to you. To determine eligibility, you may visit the website: www.silversneakers.com or contact program at (973) 218-6830 Mon-Fri between 8AM-8PM.   . Maintain a healthy weight. The body mass index (BMI) is a screening tool to identify possible weight problems. It provides an estimate of body fat based on height and weight. Your health care provider can find your BMI and can help you achieve or maintain a healthy weight.   For adults 20 years and older: ? A BMI below 18.5 is considered underweight. ? A BMI of 18.5 to 24.9 is normal. ? A BMI of 25 to 29.9 is considered overweight. ? A BMI of 30 and above is considered obese.   . Maintain normal blood lipids and cholesterol levels by exercising and minimizing your intake of saturated fat. Eat a balanced diet with plenty of fruit and vegetables. Blood tests for lipids and cholesterol should begin at age 50 and be repeated every 5 years. If your lipid or cholesterol levels are high, you are over 50, or you are at high risk for heart disease, you may need your cholesterol levels checked more frequently. Ongoing high lipid and cholesterol levels should be  treated with medicines if diet and exercise are not working.  . If you smoke, find out from your health care provider how to quit. If you do not use tobacco, please do not start.  . If you choose to drink alcohol, please do not consume more than 2 drinks per day. One drink is considered to be 12 ounces (355 mL) of beer, 5 ounces (148 mL) of wine, or 1.5 ounces (44 mL) of liquor.  . If you are 47-46 years old, ask your health care provider if you should take aspirin to prevent strokes.  . Use sunscreen. Apply sunscreen  liberally and repeatedly throughout the day. You should seek shade when your shadow is shorter than you. Protect yourself by wearing long sleeves, pants, a wide-brimmed hat, and sunglasses year round, whenever you are outdoors.  . Once a month, do a whole body skin exam, using a mirror to look at the skin on your back. Tell your health care provider of new moles, moles that have irregular borders, moles that are larger than a pencil eraser, or moles that have changed in shape or color.

## 2018-06-23 NOTE — Progress Notes (Signed)
PCP notes:   Health maintenance:  No gaps identified.  Abnormal screenings:   None  Patient concerns:   None  Nurse concerns:  None  Next PCP appt:   07/02/18 @ 1000

## 2018-06-23 NOTE — Progress Notes (Signed)
Subjective:   Alexandria Mcdowell is a 77 y.o. female who presents for Medicare Annual (Subsequent) preventive examination.  Review of Systems:  N/A Cardiac Risk Factors include: advanced age (>59men, >17 women);dyslipidemia;hypertension     Objective:     Vitals: BP 122/70 (BP Location: Right Arm, Patient Position: Sitting, Cuff Size: Normal)   Pulse (!) 50   Temp 98.4 F (36.9 C) (Oral)   Ht 5' 1.5" (1.562 m) Comment: shoes  Wt 127 lb 8 oz (57.8 kg)   SpO2 97%   BMI 23.70 kg/m   Body mass index is 23.7 kg/m.  Advanced Directives 06/23/2018 06/17/2017 05/20/2016 06/07/2014  Does Patient Have a Medical Advance Directive? Yes Yes Yes Yes  Type of Paramedic of [place];Living will [place];Living will [place];Living will Living will  Does patient want to make changes to medical advance directive? - - No - Patient declined -  Copy of [place] in Chart? No - copy requested Yes Yes No - copy requested    Tobacco Social History   Tobacco Use  Smoking Status Never Smoker  Smokeless Tobacco Never Used     Counseling given: No   Clinical Intake:  Pre-visit preparation completed: Yes  Pain : No/denies pain Pain Score: 0-No pain     Nutritional Status: BMI of 19-24  Normal Nutritional Risks: None Diabetes: No  How often do you need to have someone help you when you read instructions, pamphlets, or other written materials from your doctor or pharmacy?: 1 - Never What is the last grade level you completed in school?: Bachelor degree  Interpreter Needed?: No  Comments: pt lives with spouse Information entered by :: LPinson, LPN  Past Medical History:  Diagnosis Date  . Allergy   . Anemia   . Hyperlipidemia   . Hypertension   . Migraine    Past Surgical History:  Procedure Laterality Date  . APPENDECTOMY    . BREAST BIOPSY    . CATARACT EXTRACTION  05/05/13   right eye   .  COLONOSCOPY  2006  . [place]  . WISDOM TOOTH EXTRACTION     Family History  Problem Relation Age of Onset  . Hypertension Mother   . Hyperlipidemia Mother   . Cancer Mother        breast/colon  . Heart disease Mother   . Diabetes Father   . Emphysema Father   . Hyperlipidemia Sister   . Hypertension Sister   . Hypothyroidism Sister    Social History   Socioeconomic History  . Marital status: Married    Spouse name: Not on file  . Number of children: Not on file  . Years of education: Not on file  . Highest education level: Not on file  Occupational History  . Not on file  Social Needs  . Financial resource strain: Not on file  . Food insecurity:    Worry: Not on file    Inability: Not on file  . Transportation needs:    Medical: Not on file    Non-medical: Not on file  Tobacco Use  . Smoking status: Never Smoker  . Smokeless tobacco: Never Used  Substance and Sexual Activity  . Alcohol use: No  . Drug use: No  . Sexual activity: Yes  Lifestyle  . Physical activity:    Days per week: Not on file    Minutes per session: Not on file  .  Stress: Not on file  Relationships  . Social connections:    Talks on phone: Not on file    Gets together: Not on file    Attends religious service: Not on file    Active member of club or organization: Not on file    Attends meetings of clubs or organizations: Not on file    Relationship status: Not on file  Other Topics Concern  . 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 Laymen's guide and forms provided.    Reviewed Aug '15      Exercsie:5 days a week, treadmill   Diet: healthy, weight watchers.    Outpatient Encounter Medications as of  06/23/2018  Medication Sig  . atenolol (TENORMIN) 50 MG tablet Take 1 tablet (50 mg total) by mouth daily.  . Calcium Carbonate (CALCIUM 600 PO) Take 2 capsules by mouth daily.   Marland Kitchen lisinopril-hydrochlorothiazide (PRINZIDE,ZESTORETIC) 10-12.5 MG tablet Take 1 tablet by mouth daily.  . Menthol-Methyl Salicylate (ICY HOT EXTRA STRENGTH EX) Apply 1 application topically daily as needed.   . Multiple Vitamins-Minerals (MULTIVITAMIN WITH MINERALS) tablet Take 1 tablet by mouth daily.  . Omega-3 Fatty Acids (FISH OIL) 1200 MG CAPS Take 1 capsule by mouth daily.  . simvastatin (ZOCOR) 20 MG tablet Take 1 tablet (20 mg total) by mouth daily.  . sodium chloride (OCEAN) 0.65 % SOLN nasal spray Place 1 spray into both nostrils as needed for congestion.   No facility-administered encounter medications on file as of 06/23/2018.     Activities of Daily Living In your present state of health, do you have any difficulty performing the following activities: 06/23/2018  Hearing? N  Vision? N  Difficulty concentrating or making decisions? N  Walking or climbing stairs? N  Dressing or bathing? N  Doing errands, shopping? N  Preparing Food and eating ? N  Using the Toilet? N  In the past six months, have you accidently leaked urine? N  Do you have problems with loss of bowel control? N  Managing your Medications? N  Managing your Finances? N  Housekeeping or managing your Housekeeping? N  Some recent data might be hidden    Patient Care Team: Jinny Sanders, MD as PCP - General (Family Medicine) Lavonna Monarch, MD (Dermatology) Madilyn Hook, [place] as Consulting Physician (Optometry)    Assessment:   This is a routine wellness examination for Alexandria Mcdowell. Hearing Screening Comments: Bilateral hearing aids Vision Screening Comments: Vision exam in Sept 2019   Exercise Activities and Dietary recommendations Current Exercise Habits: Home exercise routine, Type of exercise: treadmill;walking;strength  training/weights, Time (Minutes): 45, Frequency (Times/Week): 5, Weekly Exercise (Minutes/Week): 225, Intensity: Moderate, Exercise limited by: None identified  Goals    . Increase physical activity     Starting 06/23/2018, I will continue to exercise for at least 30-45 min 5 days per week.        Fall Risk Fall Risk  06/23/2018 06/17/2017 05/20/2016 05/18/2015 05/12/2014  Falls in the past year? No No No No No    Depression Screen PHQ 2/9 Scores 06/23/2018 06/17/2017 05/20/2016 05/18/2015  PHQ - 2 Score 0 0 0 0  PHQ- 9 Score 0 0 - -  Cognitive Function MMSE - Mini Mental State Exam 06/23/2018 06/17/2017 05/20/2016  Orientation to time 5 5 5   Orientation to Place 5 5 5   Registration 3 3 3   Attention/ Calculation 0 0 0  Recall 3 3 3   Language- name 2 objects 0 0 0  Language- repeat 1 1 1   Language- follow 3 step command 3 3 3   Language- read & follow direction 0 0 0  Write a sentence 0 0 0  Copy design 0 0 0  Total score 20 20 20      PLEASE NOTE: A Mini-Cog screen was completed. Maximum score is 20. A value of 0 denotes this part of Folstein MMSE was not completed or the patient failed this part of the Mini-Cog screening.   Mini-Cog Screening Orientation to Time - Max 5 pts Orientation to Place - Max 5 pts Registration - Max 3 pts Recall - Max 3 pts Language Repeat - Max 1 pts Language Follow 3 Step Command - Max 3 pts     Immunization History  Administered Date(s) Administered  . Influenza Split 06/19/2011, 06/22/2012  . Influenza Whole 06/29/2007, 06/20/2008, 06/13/2010  . Influenza, High Dose Seasonal PF 06/06/2017, 06/15/2018  . Influenza,inj,Quad PF,6+ Mos 05/17/2013, 05/12/2014, 05/18/2015, 05/20/2016  . Pneumococcal Conjugate-13 05/18/2015  . Pneumococcal Polysaccharide-23 02/23/2007  . Td 01/30/2003  . Tetanus 05/10/2013    Screening Tests Health Maintenance  Topic Date Due  . TETANUS/TDAP  05/11/2023  . INFLUENZA VACCINE  Completed  . DEXA SCAN  Completed  .  PNA vac Low Risk Adult  Completed      Plan:   I have personally reviewed, addressed, and noted the following in the patient's chart:  A. Medical and social history B. Use of alcohol, tobacco or illicit drugs  C. Current medications and supplements D. Functional ability and status E.  Nutritional status F.  Physical activity G. Advance directives H. List of other physicians I.  Hospitalizations, surgeries, and ER visits in previous 12 months J.  [place] to include hearing, vision, cognitive, depression L. Referrals and appointments - none  In addition, I have reviewed and discussed with patient certain preventive protocols, quality metrics, and best practice recommendations. A written personalized care plan for preventive services as well as general preventive health recommendations were provided to patient.  See attached scanned questionnaire for additional information.   Signed,   Lindell Noe, MHA, BS, LPN Health Coach

## 2018-06-24 LAB — PARATHYROID HORMONE, INTACT (NO CA): PTH: 38 pg/mL (ref 14–64)

## 2018-06-28 DIAGNOSIS — M8580 Other specified disorders of bone density and structure, unspecified site: Secondary | ICD-10-CM | POA: Diagnosis not present

## 2018-07-02 ENCOUNTER — Ambulatory Visit (INDEPENDENT_AMBULATORY_CARE_PROVIDER_SITE_OTHER): Payer: Medicare HMO | Admitting: Family Medicine

## 2018-07-02 ENCOUNTER — Encounter: Payer: Self-pay | Admitting: Family Medicine

## 2018-07-02 VITALS — BP 130/70 | HR 66 | Temp 98.4°F | Ht 61.5 in | Wt 125.2 lb

## 2018-07-02 DIAGNOSIS — E7800 Pure hypercholesterolemia, unspecified: Secondary | ICD-10-CM

## 2018-07-02 DIAGNOSIS — I10 Essential (primary) hypertension: Secondary | ICD-10-CM

## 2018-07-02 DIAGNOSIS — Z862 Personal history of diseases of the blood and blood-forming organs and certain disorders involving the immune mechanism: Secondary | ICD-10-CM

## 2018-07-02 DIAGNOSIS — M8580 Other specified disorders of bone density and structure, unspecified site: Secondary | ICD-10-CM | POA: Diagnosis not present

## 2018-07-02 DIAGNOSIS — Z0000 Encounter for general adult medical examination without abnormal findings: Secondary | ICD-10-CM

## 2018-07-02 MED ORDER — ATENOLOL 50 MG PO TABS: 50.0000 mg | ORAL_TABLET | Freq: Every day | ORAL | 3 refills | Status: DC

## 2018-07-02 MED ORDER — SIMVASTATIN 20 MG PO TABS: 20.0000 mg | ORAL_TABLET | Freq: Every day | ORAL | 3 refills | Status: DC

## 2018-07-02 MED ORDER — LISINOPRIL-HYDROCHLOROTHIAZIDE 10-12.5 MG PO TABS: 1.0000 | ORAL_TABLET | Freq: Every day | ORAL | 3 refills | Status: DC

## 2018-07-02 NOTE — Assessment & Plan Note (Signed)
Stop calcium supplement.  Continue multivitamin as well.  Start separate Vit D 3 400 IU 1-2 times daily.  Return for calcium recheck in 4 weeks.

## 2018-07-02 NOTE — Assessment & Plan Note (Signed)
Resolved

## 2018-07-02 NOTE — Assessment & Plan Note (Signed)
Well controlled. Continue current medication.  

## 2018-07-02 NOTE — Progress Notes (Signed)
Subjective:    Patient ID: Alexandria Mcdowell, female    DOB: [birth year], 77 y.o.   MRN: [id number]  HPI   The patient presents for  complete physical and review of chronic health problems. He/She also has the following acute concerns today:  The patient saw Candis Musa, LPN for medicare wellness. Note reviewed in detail and important notes copied below. No gaps identified.  Abnormal screenings:   None   07/02/18 Today:  Elevated Cholesterol: Good control on simvastatin, trig  Up again this year, HDL up. Using medications without problems: Muscle aches:  Diet compliance: moderate Exercise:[employer] Other complaints:  Wt Readings from Last 3 Encounters:  07/02/18 125 lb 4 oz (56.8 kg)  06/23/18 127 lb 8 oz (57.8 kg)  04/30/18 132 lb (59.9 kg)  Body mass index is 23.28 kg/m.    Hypertension:   Good control on atenolol and lisinopril HCTZ  BP Readings from Last 3 Encounters:  07/02/18 130/70  06/23/18 122/70  04/30/18 140/70  Using medication without problems or lightheadedness:  none Chest pain with exertion: none Edema:none Short of breath:none Average home BPs: Other issues:    High calcium: on 11. She is taking calcium BID and multivitamin.  Nml PTH   Osteopenia  worsening Now on fosamax weekly. Dr. Elwyn Lade  Blood pressure 130/70, pulse 66, temperature 98.4 F (36.9 C), temperature source Oral, height 5' 1.5" (1.562 m), weight 125 lb 4 oz (56.8 kg). Social History /Family History/Past Medical History reviewed in detail and updated in EMR if needed.  Review of Systems  Constitutional: Negative for fatigue and fever.  HENT: Negative for congestion.   Eyes: Negative for pain.  Respiratory: Negative for cough and shortness of breath.   Cardiovascular: Negative for chest pain, palpitations and leg swelling.  Gastrointestinal: Negative for abdominal pain.  Genitourinary: Negative for dysuria and vaginal bleeding.  Musculoskeletal: Negative for back  pain.  Neurological: Negative for syncope, light-headedness and headaches.  Psychiatric/Behavioral: Negative for dysphoric mood.       Objective:   Physical Exam  Constitutional: Vital signs are normal. She appears well-developed and well-nourished. She is cooperative.  Non-toxic appearance. She does not appear ill. No distress.  HENT:  Head: Normocephalic.  Right Ear: Hearing, tympanic membrane, external ear and ear canal normal.  Left Ear: Hearing, tympanic membrane, external ear and ear canal normal.  Nose: Nose normal.  Eyes: Pupils are equal, round, and reactive to light. Conjunctivae, EOM and lids are normal. Lids are everted and swept, no foreign bodies found.  Neck: Trachea normal and normal range of motion. Neck supple. Carotid bruit is not present. No thyroid mass and no thyromegaly present.  Cardiovascular: Normal rate, regular rhythm, S1 normal, S2 normal, normal heart sounds and intact distal pulses. Exam reveals no gallop.  No murmur heard. Pulmonary/Chest: Effort normal and breath sounds normal. No respiratory distress. She has no wheezes. She has no rhonchi. She has no rales.  Abdominal: Soft. Normal appearance and bowel sounds are normal. She exhibits no distension, no fluid wave, no abdominal bruit and no mass. There is no hepatosplenomegaly. There is no tenderness. There is no rebound, no guarding and no CVA tenderness. No hernia.  Lymphadenopathy:    She has no cervical adenopathy.    She has no axillary adenopathy.  Neurological: She is alert. She has normal strength. No cranial nerve deficit or sensory deficit.  Skin: Skin is warm, dry and intact. No rash noted.  Psychiatric: Her speech is normal and behavior  is normal. Judgment normal. Her mood appears not anxious. Cognition and memory are normal. She does not exhibit a depressed mood.          Assessment & Plan:  The patient's preventative maintenance and recommended screening tests for an annual wellness exam  were reviewed in full today. Brought up to date unless services declined.  Counselled on the importance of diet, exercise, and its role in overall health and mortality. The patient's FH and SH was reviewed, including their home life, tobacco status, and drug and alcohol status.    Vaccines: Uptodate , not interested in shingles vaccine. Mammo: nml 04/2018, Plans q2 years. Mother breast cancer. Colon: Dr Amedeo Plenty, 2016 nml, Mother with colon cancer repeat 10 year DEXA: worsened osteopenia 2019, q 2 year... Now on fosamx 2019 PAP/DVE : PAP not indicated, no family hx of uterine ovarian cancer. No concerns. No DVE indicated. Non smoker

## 2018-07-02 NOTE — Patient Instructions (Signed)
Stop calcium supplement.  Continue multivitamin as well.  Start separate Vit D 3 400 IU 1-2 times daily.  Return for calcium recheck in 4 weeks.

## 2018-07-02 NOTE — Addendum Note (Signed)
Addended by: Carter Kitten on: 07/02/2018 11:31 AM   Modules accepted: Orders

## 2018-07-02 NOTE — Assessment & Plan Note (Signed)
Now on fosamax.

## 2018-07-29 ENCOUNTER — Telehealth: Payer: Self-pay | Admitting: Family Medicine

## 2018-07-29 DIAGNOSIS — M8580 Other specified disorders of bone density and structure, unspecified site: Secondary | ICD-10-CM

## 2018-07-29 NOTE — Telephone Encounter (Signed)
-----   Message from Lendon Collar, RT sent at 07/20/2018  9:13 AM EST ----- Regarding: Lab orders for Friday 07/30/18 Please enter lab orders for 07/30/18. Schedule states that patient is coming in for a calcium check. Thanks!

## 2018-07-30 ENCOUNTER — Other Ambulatory Visit (INDEPENDENT_AMBULATORY_CARE_PROVIDER_SITE_OTHER): Payer: Medicare HMO

## 2018-07-30 DIAGNOSIS — M8580 Other specified disorders of bone density and structure, unspecified site: Secondary | ICD-10-CM | POA: Diagnosis not present

## 2018-07-30 LAB — CALCIUM: Calcium: 10.1 mg/dL (ref 8.4–10.5)

## 2018-12-22 ENCOUNTER — Ambulatory Visit (INDEPENDENT_AMBULATORY_CARE_PROVIDER_SITE_OTHER): Payer: Medicare HMO | Admitting: Family Medicine

## 2018-12-22 ENCOUNTER — Ambulatory Visit: Payer: Self-pay | Admitting: *Deleted

## 2018-12-22 ENCOUNTER — Encounter (HOSPITAL_COMMUNITY): Payer: Self-pay | Admitting: Emergency Medicine

## 2018-12-22 ENCOUNTER — Other Ambulatory Visit: Payer: Self-pay

## 2018-12-22 ENCOUNTER — Emergency Department (HOSPITAL_COMMUNITY): Payer: Medicare HMO

## 2018-12-22 ENCOUNTER — Encounter: Payer: Self-pay | Admitting: Family Medicine

## 2018-12-22 ENCOUNTER — Emergency Department (HOSPITAL_COMMUNITY)
Admission: EM | Admit: 2018-12-22 | Discharge: 2018-12-22 | Disposition: A | Discharge status: Home / Self Care | Payer: Medicare HMO | Attending: Emergency Medicine | Admitting: Emergency Medicine

## 2018-12-22 VITALS — BP 138/78 | HR 157 | Temp 97.9°F | Wt 122.0 lb

## 2018-12-22 DIAGNOSIS — I10 Essential (primary) hypertension: Secondary | ICD-10-CM | POA: Diagnosis not present

## 2018-12-22 DIAGNOSIS — R42 Dizziness and giddiness: Secondary | ICD-10-CM | POA: Diagnosis not present

## 2018-12-22 DIAGNOSIS — Z79899 Other long term (current) drug therapy: Secondary | ICD-10-CM | POA: Diagnosis not present

## 2018-12-22 DIAGNOSIS — I4891 Unspecified atrial fibrillation: Secondary | ICD-10-CM

## 2018-12-22 DIAGNOSIS — I480 Paroxysmal atrial fibrillation: Secondary | ICD-10-CM | POA: Insufficient documentation

## 2018-12-22 DIAGNOSIS — R9431 Abnormal electrocardiogram [ECG] [EKG]: Secondary | ICD-10-CM | POA: Diagnosis not present

## 2018-12-22 DIAGNOSIS — R002 Palpitations: Secondary | ICD-10-CM

## 2018-12-22 DIAGNOSIS — R000 Tachycardia, unspecified: Secondary | ICD-10-CM | POA: Diagnosis not present

## 2018-12-22 LAB — CBC
HCT: 42.5 % (ref 36.0–46.0)
Hemoglobin: 14.6 g/dL (ref 12.0–15.0)
MCH: 30.7 pg (ref 26.0–34.0)
MCHC: 34.4 g/dL (ref 30.0–36.0)
MCV: 89.3 fL (ref 80.0–100.0)
Platelets: 245 10*3/uL (ref 150–400)
RBC: 4.76 MIL/uL (ref 3.87–5.11)
RDW: 12.4 % (ref 11.5–15.5)
WBC: 8.6 10*3/uL (ref 4.0–10.5)
nRBC: 0 % (ref 0.0–0.2)

## 2018-12-22 LAB — BASIC METABOLIC PANEL
Anion gap: 14 (ref 5–15)
BUN: 17 mg/dL (ref 8–23)
CO2: 22 mmol/L (ref 22–32)
Calcium: 10.4 mg/dL — ABNORMAL HIGH (ref 8.9–10.3)
Chloride: 103 mmol/L (ref 98–111)
Creatinine, Ser: 1.01 mg/dL — ABNORMAL HIGH (ref 0.44–1.00)
GFR calc Af Amer: >60 mL/min (ref >60)
GFR calc non Af Amer: 53 mL/min — ABNORMAL LOW (ref >60)
Glucose, Bld: 115 mg/dL — ABNORMAL HIGH (ref 70–99)
Potassium: 4 mmol/L (ref 3.5–5.1)
Sodium: 139 mmol/L (ref 135–145)

## 2018-12-22 MED ORDER — DILTIAZEM HCL 25 MG/5ML IV SOLN
10.0000 mg | Freq: Once | INTRAVENOUS | Status: AC
  Administered 2018-12-22: 10 mg via INTRAVENOUS
  Filled 2018-12-22: qty 5

## 2018-12-22 MED ORDER — ETOMIDATE 2 MG/ML IV SOLN
INTRAVENOUS | Status: AC | PRN
  Administered 2018-12-22: 5 mg via INTRAVENOUS

## 2018-12-22 MED ORDER — APIXABAN 5 MG PO TABS
5.0000 mg | ORAL_TABLET | Freq: Once | ORAL | Status: AC
  Administered 2018-12-22: 5 mg via ORAL
  Filled 2018-12-22: qty 1

## 2018-12-22 MED ORDER — ETOMIDATE 2 MG/ML IV SOLN
10.0000 mg | Freq: Once | INTRAVENOUS | Status: DC
  Filled 2018-12-22: qty 10

## 2018-12-22 MED ORDER — APIXABAN 5 MG PO TABS: 5.0000 mg | ORAL_TABLET | Freq: Two times a day (BID) | ORAL | 0 refills | Status: DC

## 2018-12-22 NOTE — Sedation Documentation (Signed)
Shock administered 100J

## 2018-12-22 NOTE — Progress Notes (Signed)
Subjective:     Alexandria Mcdowell is a 78 y.o. female presenting for Dizziness (x 2 days...intermittent lightheadedness when "Heart racing" occurs " when I stand up too fast." ) and Palpitations (denies CP,SOB, chest tightness)     Dizziness  This is a new problem. The current episode started yesterday. The problem occurs 2 to 4 times per day. Pertinent negatives include no abdominal pain, arthralgias, chest pain, chills, coughing, fever, headaches, nausea, neck pain, numbness, sore throat, visual change, vomiting or weakness. The symptoms are aggravated by standing (has avoided exercise). She has tried rest for the symptoms. The treatment provided mild relief.  Palpitations   This is a new problem. Associated symptoms include dizziness. Pertinent negatives include no chest pain, coughing, fever, nausea, numbness, shortness of breath, vomiting or weakness.    Hx of arrhythmia in the past, but has been years since this happened    Review of Systems  Constitutional: Negative for chills and fever.  HENT: Negative for sore throat.   Respiratory: Negative for cough and shortness of breath.   Cardiovascular: Positive for palpitations. Negative for chest pain.  Gastrointestinal: Negative for abdominal pain, nausea and vomiting.  Musculoskeletal: Negative for arthralgias and neck pain.  Neurological: Positive for dizziness. Negative for weakness, numbness and headaches.     Social History   Tobacco Use  Smoking Status Never Smoker  Smokeless Tobacco Never Used        Objective:    BP Readings from Last 3 Encounters:  12/22/18 138/78  07/02/18 130/70  06/23/18 122/70   Wt Readings from Last 3 Encounters:  12/22/18 122 lb (55.3 kg)  07/02/18 125 lb 4 oz (56.8 kg)  06/23/18 127 lb 8 oz (57.8 kg)    BP 138/78   Pulse (!) 157   Temp 97.9 F (36.6 C) (Oral)   Wt 122 lb (55.3 kg)   SpO2 98%   BMI 22.68 kg/m    Physical Exam Constitutional:      General: She is not in  acute distress.    Appearance: She is well-developed. She is not diaphoretic.  HENT:     Right Ear: External ear normal.     Left Ear: External ear normal.     Nose: Nose normal.  Eyes:     Conjunctiva/sclera: Conjunctivae normal.  Neck:     Musculoskeletal: Neck supple.  Cardiovascular:     Rate and Rhythm: Tachycardia present. Rhythm irregular.     Pulses: Normal pulses.  Pulmonary:     Effort: Pulmonary effort is normal. No respiratory distress.     Breath sounds: Normal breath sounds. No wheezing, rhonchi or rales.  Musculoskeletal:     Comments: Trace ankle edema  Skin:    General: Skin is warm and dry.     Capillary Refill: Capillary refill takes less than 2 seconds.  Neurological:     Mental Status: She is alert. Mental status is at baseline.  Psychiatric:        Mood and Affect: Mood normal.        Behavior: Behavior normal.     EKG: Tachycardia, ?atrial flutter, new ST depressions in leads V4, V5, V6      Assessment & Plan:   Problem List Items Addressed This Visit    None    Visit Diagnoses    Palpitations    -  Primary   Relevant Orders   EKG 12-Lead (Completed)   Lightheadedness       Relevant Orders  EKG 12-Lead (Completed)     Overall well appearing, however with tachycardia in 150s and lightheadedness as well as continuous ST depression cannot rule out ischemia. Pt instructed to go to the ER for further evaluation and management. She went by private vehicle (husband) and sign out to [facility] was provided  Return in about 1 week (around 12/29/2018) for ER follow-up.  Lesleigh Noe, MD

## 2018-12-22 NOTE — ED Triage Notes (Signed)
Pt here for evaluation of abnormal EKG.  Pt went to PCP this AM for concerns of BP intermittently hypertensive.  Pt denies any pain currently.

## 2018-12-22 NOTE — ED Notes (Signed)
Patient verbalizes understanding of discharge instructions. Opportunity for questioning and answers were provided. Armband removed by staff, pt discharged from ED.  

## 2018-12-22 NOTE — Telephone Encounter (Signed)
One note from [facility] has been sent to Dr Einar Pheasant. Will send this note as well.

## 2018-12-22 NOTE — Telephone Encounter (Signed)
Pt seen in office with EKG with ST changes and tachycardia, sent to the ER for further evaluation

## 2018-12-22 NOTE — Telephone Encounter (Signed)
[place] nurse at [facility] said pt is intermittently lightheaded,no CP,SOB, chest tightness. Maryann said no covid precautions and ED precautions given to pt by [facility]. Pt has appt with Dr Einar Pheasant 12/22/18 at 10 AM.

## 2018-12-22 NOTE — Discharge Instructions (Addendum)
You were seen in the ER today for abnormal heart rate and were found to be in atrial fibrillation. We cardioverted you (shocked you) out of this rhythm.  We are staring you on Eliquis- a blood thinner to help reduce your risk of stroke as atrial fibrillation increases this risk Please take this as prescribed.  See details of this medicine below.   We have prescribed you new medication(s) today. Discuss the medications prescribed today with your pharmacist as they can have adverse effects and interactions with your other medicines including over the counter and prescribed medications. Seek medical evaluation if you start to experience new or abnormal symptoms after taking one of these medicines, seek care immediately if you start to experience difficulty breathing, feeling of your throat closing, facial swelling, or rash as these could be indications of a more serious allergic reaction  It is important to understand that this medicine is blood thinner- if you have a head injury, headache, numbness, weakness, abnormal speech, facial asymmetry, blood in urine/stool, bruising, or other concerns return to the ER immediately.   Follow up with afib clinic/primary care within 3 days. Return to the ER for reasons above or for new or worsening symptoms including but not limited to chest pain, trouble breathing, passing out, or any other concerns.    Information on my medicine - ELIQUIS (apixaban)  This medication education was reviewed with me or my healthcare representative as part of my discharge preparation.  WHY WAS ELIQUIS PRESCRIBED FOR YOU? Eliquis was prescribed for you to reduce the risk of a blood clot forming that can cause a stroke if you have a medical condition called atrial fibrillation (a type of irregular heartbeat).  WHAT DO YOU NEED TO KNOW ABOUT ELIQUIS ? Take your Eliquis TWICE DAILY - one tablet in the morning and one tablet in the evening with or without food. If you have  difficulty swallowing the tablet whole please discuss with your pharmacist how to take the medication safely.  Take Eliquis exactly as prescribed by your doctor and DO NOT stop taking Eliquis without talking to the doctor who prescribed the medication.  Stopping may increase your risk of developing a stroke.  Refill your prescription before you run out.  After discharge, you should have regular check-up appointments with your healthcare provider that is prescribing your Eliquis.  In the future your dose may need to be changed if your kidney function or weight changes by a significant amount or as you get older.  WHAT DO YOU DO IF YOU MISS A DOSE? If you miss a dose, take it as soon as you remember on the same day and resume taking twice daily.  Do not take more than one dose of ELIQUIS at the same time to make up a missed dose.  IMPORTANT SAFETY INFORMATION A possible side effect of Eliquis is bleeding. You should call your healthcare provider right away if you experience any of the following: Bleeding from an injury or your nose that does not stop. Unusual colored urine (red or dark brown) or unusual colored stools (red or black). Unusual bruising for unknown reasons. A serious fall or if you hit your head (even if there is no bleeding).  Some medicines may interact with Eliquis and might increase your risk of bleeding or clotting while on Eliquis. To help avoid this, consult your healthcare provider or pharmacist prior to using any new prescription or non-prescription medications, including herbals, vitamins, non-steroidal anti-inflammatory drugs (NSAIDs) and supplements.  This website has more information on Eliquis (apixaban): http://www.eliquis.com/eliquis/home

## 2018-12-22 NOTE — Telephone Encounter (Signed)
Pt reports "Heart rate and BP all over the place." States onset yesterday, see previous triage note for values (Call was dropped). Reports intermittent lightheadedness when "Heart racing" occurs "Or when I stand up too fast." denies chest pain,no chest tightness, no SOB. Attempted to have pt check pulse rate while on call, unable to do so. States "Not really palpitations, just can tell something is off." States feels heart race at times."  States had similar episodes years ago, "Everything was ok., never found anything."Denies cough, fever, no travel. [place] called and spoke to [place]. Appt for this am secured. Pt advised if symptoms worsen to go to ED. Pt verbalizes understanding.   Reason for Disposition . Age > 60 years (Exception: brief heart beat symptoms that went away and now feels well)  Answer Assessment - Initial Assessment Questions 1. DESCRIPTION: "Please describe your heart rate or heart beat that you are having" (e.g., fast/slow, regular/irregular, skipped or extra beats, "palpitations")    Fast/slows down. "Can tell something is off." 2. ONSET: "When did it start?" (Minutes, hours or days)      Yesterday 3. DURATION: "How long does it last" (e.g., seconds, minutes, hours)     Less than one minute 4. PATTERN "Does it come and go, or has it been constant since it started?"  "Does it get worse with exertion?"   "Are you feeling it now?" Comes and goes     5. TAP: "Using your hand, can you tap out what you are feeling on a chair or table in front of you, so that I can hear?" (Note: not all patients can do this)       Unable 6. HEART RATE: "Can you tell me your heart rate?" "How many beats in 15 seconds?"  (Note: not all patients can do this)       no 7. RECURRENT SYMPTOM: "Have you ever had this before?" If so, ask: "When was the last time?" and "What happened that time?"      Years ago 8. CAUSE: "What do you think is causing the palpitations?"     "Not really palpitations" "Just off" 9.  CARDIAC HISTORY: "Do you have any history of heart disease?" (e.g., heart attack, angina, bypass surgery, angioplasty, arrhythmia)     no 10. OTHER SYMPTOMS: "Do you have any other symptoms?" (e.g., dizziness, chest pain, sweating, difficulty breathing)       Lightheadedness at times  Protocols used: [place]

## 2018-12-22 NOTE — ED Provider Notes (Signed)
[facility]GENCY DEPARTMENT Provider Note   CSN: [id number] Arrival date & time: 12/22/18  1104    History   Chief Complaint Chief Complaint  Patient presents with  . Palpitations    HPI Alexandria Mcdowell is a 78 y.o. female HTN, hyperlipidemia, migraines, and osteopenia who presents to the ED with complaints of palpitations that began yesterday AM. Patient states she woke up and throughout the morning just did not feel quite right with intermittent palpitations with mild dyspnea throughout the day described as heart racing/fluttering sensation. She is unsure of the duration or number of palpitation episodes, but is sure this started yesterday AM. She has been checking her HR BP consistently with home monitor with blood pressures ranging 80s-130s/50s-90s with HR ranging 73-138. This has continued into this AM. Seen at PCP with abnormal EKG and sent to the ER for further assessment/management. Currently asymptomatic. Denies chest pain at any point. Denies dizziness,nausea, vomiting, diaphoresis, syncope, leg pain/swelling, hemoptysis, recent surgery/trauma, recent long travel, hormone use, personal hx of cancer, or hx of DVT/PE.  She vaguely recalls having an abnormal heart rhythm in in the late 90s or early 2000s but is unsure what it was. Was not afib.      HPI  Past Medical History:  Diagnosis Date  . Allergy   . Anemia   . Hyperlipidemia   . Hypertension   . Migraine     Patient Active Problem List   Diagnosis Date Noted  . Hypercalcemia 07/02/2018  . Bradycardia 06/20/2016  . Counseling regarding end of life decision making 05/18/2015  . Osteopenia 01/14/2012  . Hypercholesterolemia 10/14/2007  . History of anemia 10/14/2007  . Essential hypertension 10/14/2007  . ALLERGIC RHINITIS 10/14/2007  . 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 MIGRAINES, HX OF 10/14/2007    Past Surgical History:  Procedure Laterality Date  .  APPENDECTOMY    . BREAST BIOPSY    . CATARACT EXTRACTION  05/05/13   right eye   . COLONOSCOPY  2006  . [place]  . WISDOM TOOTH EXTRACTION       OB History   No obstetric history on file.      Home Medications    Prior to Admission medications   Medication Sig Start Date End Date Taking? Authorizing Provider  alendronate (FOSAMAX) 35 MG tablet Take 35 mg by mouth every 7 (seven) days. Take with a full glass of water on an empty stomach.    [provider]  atenolol (TENORMIN) 50 MG tablet Take 1 tablet (50 mg total) by mouth daily. 07/02/18   Bedsole, Amy E, MD  lisinopril-hydrochlorothiazide (PRINZIDE,ZESTORETIC) 10-12.5 MG tablet Take 1 tablet by mouth daily. 07/02/18   Bedsole, Amy E, MD  Menthol-Methyl Salicylate (ICY HOT EXTRA STRENGTH EX) Apply 1 application topically daily as needed.     [provider]  Multiple Vitamins-Minerals (MULTIVITAMIN WITH MINERALS) tablet Take 1 tablet by mouth daily.    [provider]  Omega-3 Fatty Acids (FISH OIL) 1200 MG CAPS Take 1 capsule by mouth daily.    [provider]  simvastatin (ZOCOR) 20 MG tablet Take 1 tablet (20 mg total) by mouth daily. 07/02/18   Bedsole, Amy E, MD  sodium chloride (OCEAN) 0.65 % SOLN nasal spray Place 1 spray into both nostrils as needed for congestion.    [provider]    Family History Family History  Problem Relation Age of Onset  . Hypertension Mother   .  Hyperlipidemia Mother   . Cancer Mother        breast/colon  . Heart disease Mother   . Diabetes Father   . Emphysema Father   . Hyperlipidemia Sister   . Hypertension Sister   . Hypothyroidism Sister     Social History Social History   Tobacco Use  . Smoking status: Never Smoker  . Smokeless tobacco: Never Used  Substance Use Topics  . Alcohol use: No  . Drug use: No     Allergies   Clindamycin hcl; Codeine; Erythromycin; Tetracycline; Penicillins; and  Sulfonamide derivatives   Review of Systems Review of Systems  Constitutional: Negative for chills and fever.  Respiratory: Positive for shortness of breath. Negative for cough and wheezing.   Cardiovascular: Positive for palpitations. Negative for chest pain and leg swelling.  Gastrointestinal: Negative for abdominal pain, nausea and vomiting.  Neurological: Negative for dizziness and syncope.  All other systems reviewed and are negative.   Physical Exam Updated Vital Signs BP (!) 119/95   Pulse (!) 150   Resp 15   Ht 5\' 3"  (1.6 m)   Wt 55.3 kg   SpO2 98%   BMI 21.60 kg/m   Physical Exam Vitals signs and nursing note reviewed.  Constitutional:      General: She is not in acute distress.    Appearance: She is well-developed. She is not toxic-appearing.  HENT:     Head: Normocephalic and atraumatic.  Eyes:     General:        Right eye: No discharge.        Left eye: No discharge.     Conjunctiva/sclera: Conjunctivae normal.  Neck:     Musculoskeletal: Neck supple.  Cardiovascular:     Rate and Rhythm: Tachycardia present. Rhythm irregularly irregular.  Pulmonary:     Effort: Pulmonary effort is normal. No respiratory distress.     Breath sounds: Normal breath sounds. No wheezing, rhonchi or rales.  Abdominal:     General: There is no distension.     Palpations: Abdomen is soft.     Tenderness: There is no abdominal tenderness.  Musculoskeletal:     Comments: Trace symmetric edema to lower legs without overlying erythema/warmth.   Skin:    General: Skin is warm and dry.     Findings: No rash.  Neurological:     Mental Status: She is alert.     Comments: Clear speech.   Psychiatric:        Behavior: Behavior normal.    ED Treatments / Results  Labs (all labs ordered are listed, but only abnormal results are displayed) Labs Reviewed  BASIC METABOLIC PANEL - Abnormal; Notable for the following components:      Result Value   Glucose, Bld 115 (*)     Creatinine, Ser 1.01 (*)    Calcium 10.4 (*)    GFR calc non Af Amer 53 (*)    All other components within normal limits  CBC    EKG EKG Interpretation  Date/Time:  Wednesday December 22 2018 11:15:55 EDT Ventricular Rate:  152 PR Interval:    QRS Duration: 77 QT Interval:  256 QTC Calculation: 407 R Axis:   52 Text Interpretation:  Atrial fibrillation with rapid V-rate Repolarization abnormality, prob rate related Confirmed by Gerlene Fee [phone]) on 12/22/2018 11:23:00 AM  EKG Interpretation  Date/Time:  Wednesday December 22 2018 12:36:34 EDT Ventricular Rate:  97 PR Interval:    QRS Duration: 76 QT Interval:  263 QTC Calculation: 334 R Axis:   51 Text Interpretation:  Sinus rhythm Consider left atrial enlargement Nonspecific repol abnormality, diffuse leads Baseline wander in lead(s) I III aVL Confirmed by Gerlene Fee [phone]) on 12/22/2018 12:51:16 PM  Radiology Dg Chest Port 1 View  Result Date: 12/22/2018 CLINICAL DATA:  EKG with ST changes and tachycardia.  Palpitations. EXAM: PORTABLE CHEST 1 VIEW COMPARISON:  06/07/2014 FINDINGS: Cardiac silhouette is mildly enlarged. No mediastinal or hilar masses. No evidence of adenopathy. Mild linear scarring in the right middle lobe, stable. Lungs are otherwise clear. No pleural effusion or pneumothorax. Skeletal structures are grossly intact. IMPRESSION: No active disease. Electronically Signed   By: Lajean Manes M.D.   On: 12/22/2018 12:28    Procedures .Cardioversion Date/Time: 12/22/2018 12:35 PM Performed by: Amaryllis Dyke, PA-C Authorized by: Amaryllis Dyke, PA-C   Consent:    Consent obtained:  Verbal and written   Consent given by:  Patient   Risks discussed:  Cutaneous burn, death, induced arrhythmia and pain   Alternatives discussed:  No treatment and alternative treatment Pre-procedure details:    Cardioversion basis:  Elective   Rhythm:  Atrial fibrillation Patient sedated: Yes. Refer to sedation  procedure documentation for details of sedation.  Attempt one:    Cardioversion mode:  Synchronous   Waveform:  Biphasic   Shock (Joules):  100   Shock outcome:  Conversion to normal sinus rhythm Post-procedure details:    Patient status:  Awake   Patient tolerance of procedure:  Tolerated well, no immediate complications Comments:     Procedure performed with supervising physician Dr. Sedonia Small at bedside- please see his note for procedure sedation.      (including critical care time)  CRITICAL CARE Performed by: Kennith Maes   Total critical care time: 30 minutes for new onset afib w/ RVR   Critical care time was exclusive of separately billable procedures and treating other patients.  Critical care was necessary to treat or prevent imminent or life-threatening deterioration.  Critical care was time spent personally by me on the following activities: development of treatment plan with patient and/or surrogate as well as nursing, discussions with consultants, evaluation of patient's response to treatment, examination of patient, obtaining history from patient or surrogate, ordering and performing treatments and interventions, ordering and review of laboratory studies, ordering and review of radiographic studies, pulse oximetry and re-evaluation of patient's condition.   Medications Ordered in ED Medications  etomidate (AMIDATE) injection 10 mg (5 mg Intravenous See Procedure Record 12/22/18 1234)  apixaban (ELIQUIS) tablet 5 mg (has no administration in time range)  diltiazem (CARDIZEM) injection 10 mg (10 mg Intravenous Given 12/22/18 1145)  etomidate (AMIDATE) injection (5 mg Intravenous Given 12/22/18 1234)     Initial Impression / Assessment and Plan / ED Course  I have reviewed the triage vital signs and the nursing notes.  Pertinent labs & imaging results that were available during my care of the patient were reviewed by me and considered in my medical decision making  (see chart for details).   Patient presents to the ED with intermittent palpitations that began yesterday AM, asymptomatic at present. Nontoxic appearing, does not appear in acute distress. She is noted to be in afib w/ RVR upon arrival. Initial BP soft at 94/70 immediate repeat improved to 119/95. Plan for 10 mg of cardizem and likely cardioversion given onset within 48 hours. Basic labs & CXR ordered. She has not had chest pain, feel ACS is less  likely. Low risk wells, doubt PE.   Work-up reviewed:  CBC: No anemia/leukocytosis.  BMP: Mild hyperglycemia/hypercalcemia similar to prior. Creatinine minimally increased from prior.  CXR: Negative. No infiltrate/edema/effusion/pneumothorax.   Successful cardioversion per note above. Patient in sinus rhythm (normal to mild brady) on the monitor with occasional PACs/PVCs on the monitor, sinus rhythm w/ repeat EKG.  CHA2DS2/VAS score of 4- start eliquis w/ pharmacy consult in the ER for patient education. Ambulatory referral to afib clinic. Patient feeling well without complaints at time of discharge feels ready to go home, well appearing, no distress. Discharged home with prescription for eliquis. I discussed risk/benefits of this medicine as well as reasons to return to the ER related to bleeding/injury. I also specifically discussed additional reasons to return to the ER such as chest pain, dyspnea, near syncope, syncope, etc. I discussed results, treatment plan, need for follow-up, and return precautions with the patient. Provided opportunity for questions, patient confirmed understanding and is in agreement with plan.    Vitals:   12/22/18 1415 12/22/18 1430  BP: (!) 151/71 135/70  Pulse: (!) 55 (!) 55  Resp: 19 (!) 21  Temp:    SpO2: 100% 100%    This is a shared visit with supervising physician Dr. Sedonia Small who has independently evaluated patient & provided guidance in evaluation/management/disposition, in agreement with care    Final Clinical  Impressions(s) / ED Diagnoses   Final diagnoses:  Atrial fibrillation with rapid ventricular response [facility])    ED Discharge Orders         Ordered    Amb referral to AFIB Clinic     12/22/18 1128    apixaban (ELIQUIS) 5 MG TABS tablet  2 times daily     12/22/18 [address], [place], PA-C 12/22/18 1517    Maudie Flakes, MD 12/23/18 [phone]

## 2018-12-22 NOTE — Patient Instructions (Signed)
Go to the ER to make sure you are not having a heart attack and to treat your fast heart rate.   No special instructions, but someone from the ER will help keep you safe to avoid those with sick symptoms.

## 2018-12-22 NOTE — ED Provider Notes (Signed)
.  Sedation Date/Time: 12/22/2018 12:46 PM Performed by: Maudie Flakes, MD Authorized by: Maudie Flakes, MD   Consent:    Consent obtained:  Verbal and written   Consent given by:  Patient   Risks discussed:  Inadequate sedation and respiratory compromise necessitating ventilatory assistance and intubation   Alternatives discussed: rate control. Universal protocol:    Immediately prior to procedure a time out was called: yes   Indications:    Procedure performed:  Cardioversion Pre-sedation assessment:    Time since last food or drink:  4 hours   ASA classification: class 1 - normal, healthy patient     Neck mobility: normal     Mallampati score:  I - soft palate, uvula, fauces, pillars visible   Pre-sedation assessments completed and reviewed: airway patency, cardiovascular function, mental status and respiratory function   Immediate pre-procedure details:    Reassessment: Patient reassessed immediately prior to procedure     Reviewed: vital signs and NPO status     Verified: bag valve mask available, emergency equipment available, intubation equipment available, IV patency confirmed, oxygen available and suction available   Procedure details (see MAR for exact dosages):    Preoxygenation:  Nasal cannula   Sedation:  Etomidate   Intra-procedure monitoring:  Blood pressure monitoring, cardiac monitor, continuous capnometry and continuous pulse oximetry   Intra-procedure events: none     Intra-procedure management:  Airway repositioning   Total Provider sedation time (minutes):  16 Post-procedure details:    Attendance: Constant attendance by certified staff until patient recovered     Recovery: Patient returned to pre-procedure baseline     Post-sedation assessments completed and reviewed: airway patency, cardiovascular function, mental status and respiratory function     Patient is stable for discharge or admission: yes     Patient tolerance:  Tolerated well, no immediate  complications      Maudie Flakes, MD 12/22/18 1249

## 2018-12-22 NOTE — Telephone Encounter (Addendum)
Patient is having symptoms of her heart racing. Patient is feeling that she is having fluctuations in her heart rate. Patient stats her readings this morning have been:   130/97 p120 136/84 p138 115/95 p102  Patient is not feeling her heart rate changing- but she is getting the different readings.   Patient states she measured her BP yesterday and got the following readings:  3:30 BP 110/93 p 92 4:45 BP 109/67 p 102 6:30 BP 105/78 p 116  During call computer system went down and call got disconnected- have tried to call patient back at home number- but she did not answer. 2 attempts

## 2018-12-23 ENCOUNTER — Telehealth: Payer: Self-pay | Admitting: Family Medicine

## 2018-12-23 NOTE — Telephone Encounter (Signed)
Tried calling pt no answer Please call this patient and have her make virtual follow up visit in 1 week for ER follow up new dx afib.  Eliezer Lofts, MD  [place] at [facility]

## 2018-12-27 ENCOUNTER — Ambulatory Visit (HOSPITAL_COMMUNITY)
Admission: RE | Admit: 2018-12-27 | Discharge: 2018-12-27 | Disposition: A | Discharge status: Home / Self Care | Payer: Medicare HMO | Source: Ambulatory Visit | Attending: Physician Assistant | Admitting: Physician Assistant

## 2018-12-27 ENCOUNTER — Telehealth (HOSPITAL_COMMUNITY): Payer: Self-pay | Admitting: *Deleted

## 2018-12-27 ENCOUNTER — Other Ambulatory Visit: Payer: Self-pay

## 2018-12-27 DIAGNOSIS — I480 Paroxysmal atrial fibrillation: Secondary | ICD-10-CM | POA: Insufficient documentation

## 2018-12-27 NOTE — Telephone Encounter (Signed)
Appointment 4/16

## 2018-12-27 NOTE — Telephone Encounter (Addendum)
Pt meds reconciled. Pt reports doing  Well on blood thinner.   128/70 HR 51  Vitals today  Pt reports no symptoms or complaints

## 2018-12-27 NOTE — Progress Notes (Signed)
Electrophysiology TeleHealth Note   Due to national recommendations of social distancing due to [place] 19, Audio telehealth visit is felt to be most appropriate for this patient at this time.  See consent below from today for patient consent regarding telehealth for the Atrial Fibrillation Clinic. Consent obtained verbally.   Date:  12/27/2018   ID:  [name], DOB [birth year], MRN [id number]  Location: home  Provider location: [address] [place], [place] [address] Evaluation Performed: New patient consult  PCP:  Jinny Sanders, MD  Primary Cardiologist:  Dr Johnsie Cancel   CC: Evaluation for new onset atrial fibrillation   History of Present Illness: Alexandria Mcdowell is a 78 y.o. female who presents via audio conferencing for a telehealth visit today.   The patient is referred for new consultation regarding new onset atrial fibrillation by the [facility] ER. Patient reports that the evening of 12/21/18 she started to feel "off." The next morning, she felt her heart racing which she describes as a "speeding train" with associated fatigue and SOB. She went to her PCP office and was found to be in afib with RVR and she was sent to the ER for evaluation. She underwent successful DCCV at the ER and was discharged on Eliquis. She was already taking atenolol prior. She denies any precipitating factors. She denies snoring or significant alcohol use. She admits that she has had brief palpitations years ago but has never had an episode like this.  Today, she denies symptoms of palpitations, chest pain, shortness of breath, orthopnea, PND, lower extremity edema, claudication, dizziness, presyncope, syncope, bleeding, or neurologic sequela. The patient is tolerating medications without difficulties and is otherwise without complaint today.   she denies symptoms of cough, fevers, chills, or new SOB worrisome for COVID 19.     Atrial Fibrillation Risk Factors:  she does not have symptoms or  diagnosis of sleep apnea. she does not have a history of rheumatic fever. she does not have a history of alcohol use. The patient does not have a history of early familial atrial fibrillation or other arrhythmias.  she has a BMI of There is no height or weight on file to calculate BMI..  BP 128/70 Pulse 51 Provided by patient with home BP machine  Past Medical History:  Diagnosis Date  . Allergy   . Anemia   . Hyperlipidemia   . Hypertension   . Migraine    Past Surgical History:  Procedure Laterality Date  . APPENDECTOMY    . BREAST BIOPSY    . CATARACT EXTRACTION  05/05/13   right eye   . COLONOSCOPY  2006  . [place]  . WISDOM TOOTH EXTRACTION       Current Outpatient Medications  Medication Sig Dispense Refill  . alendronate (FOSAMAX) 35 MG tablet Take 35 mg by mouth every 7 (seven) days. Take with a full glass of water on an empty stomach.    Marland Kitchen apixaban (ELIQUIS) 5 MG TABS tablet Take 1 tablet (5 mg total) by mouth 2 (two) times daily for 30 days. 60 tablet 0  . atenolol (TENORMIN) 50 MG tablet Take 1 tablet (50 mg total) by mouth daily. 90 tablet 3  . lisinopril-hydrochlorothiazide (PRINZIDE,ZESTORETIC) 10-12.5 MG tablet Take 1 tablet by mouth daily. 90 tablet 3  . Multiple Vitamins-Minerals (MULTIVITAMIN WITH MINERALS) tablet Take 1 tablet by mouth daily.    . Omega-3 Fatty Acids (FISH OIL) 1200 MG CAPS Take 1 capsule  by mouth daily.    . simvastatin (ZOCOR) 20 MG tablet Take 1 tablet (20 mg total) by mouth daily. 90 tablet 3  . sodium chloride (OCEAN) 0.65 % SOLN nasal spray Place 1 spray into both nostrils as needed for congestion.     No current facility-administered medications for this encounter.     Allergies:   Clindamycin hcl; Codeine; Erythromycin; Tetracycline; Penicillins; and Sulfonamide derivatives   Social History:  The patient  reports that she has never smoked. She has never used smokeless tobacco. She reports that she does  not drink alcohol or use drugs.   Family History:  The patient's  family history includes Cancer in her mother; Diabetes in her father; Emphysema in her father; Heart disease in her mother; Hyperlipidemia in her mother and sister; Hypertension in her mother and sister; Hypothyroidism in her sister.    ROS:  Please see the history of present illness.   All other systems are personally reviewed and negative.   Recent Labs: 06/23/2018: ALT 28; TSH 0.98 12/22/2018: BUN 17; Creatinine, Ser 1.01; Hemoglobin 14.6; Platelets 245; Potassium 4.0; Sodium 139  personally reviewed    Other studies personally reviewed: Additional studies/ records that were reviewed today include: Epic notes, echocardiogram  Echo 06/21/2014 - LVEF 55-60%, normal wall thickness, moderate LAE, mild RAE, mild MV thickening with late systolic bileaflet prolapse, moderate MR, Stage 2 diastolic dysfunction with elevated LV filing pressure.   ASSESSMENT AND PLAN:  1. Paroxysmal atrial fibrillation Patient has new onset atrial fibrillation. No precipitating factors identified. Patient has not had any symptoms since DCCV. General education about atrial fibrillation and anticoagulation provided.  Would not start her on more daily rate control given resting bradycardia. We did discuss starting diltiazem 30 mg PRN for heart racing. She wishes to discuss this with her PCP also before starting. Continue atenolol 50 mg daily Continue Eliquis 5 mg BID. Check Bmet/CBC in one month. Will plan to repeat echocardiogram once COVID precautions end.   This patients CHA2DS2-VASc Score and unadjusted Ischemic Stroke Rate (% per year) is equal to 4.8 % stroke rate/year from a score of 4  Above score calculated as 1 point each if present [CHF, HTN, DM, Vascular=MI/PAD/Aortic Plaque, Age if 65-74, or Female] Above score calculated as 2 points each if present [Age > 75, or Stroke/TIA/TE]  2. HTN Stable, no changes   COVID screen The  patient does not have any symptoms that suggest any further testing/ screening at this time.  Social distancing reinforced today.    Follow-up with PCP as scheduled. Follow up with AF clinic in one month for telehealth visit.   Current medicines are reviewed at length with the patient today.   The patient does not have concerns regarding her medicines.  The following changes were made today:  None, patient will contact our office if she wishes to get diltiazem Rx filled.  Labs/ tests ordered today include:  No orders of the defined types were placed in this encounter.   Patient Risk:  after full review of this patients clinical status, I feel that they are at moderate risk at this time.   Today, I have spent 27 minutes with the patient with telehealth technology discussing atrial fibrillation, anticoagulation, diltiazem, and COVID-19 precautions.    Gwenlyn Perking PA-C 12/27/2018 9:52 AM  Afib [facility] [place], [place] [address] [phone]   I hereby voluntarily request, consent and authorize the [facility] and its employed  or contracted physicians, physician assistants, nurse practitioners or other licensed health care professionals (the Practitioner), to provide me with telemedicine health care services (the "Services") as deemed necessary by the treating Practitioner. I acknowledge and consent to receive the Services by the Practitioner via telemedicine. I understand that the telemedicine visit will involve communicating with the Practitioner through live audiovisual communication technology and the disclosure of certain medical information by electronic transmission. I acknowledge that I have been given the opportunity to request an in-person assessment or other available alternative prior to the telemedicine visit and am voluntarily participating in the telemedicine visit.   I understand that I have the right to withhold or  withdraw my consent to the use of telemedicine in the course of my care at any time, without affecting my right to future care or treatment, and that the Practitioner or I may terminate the telemedicine visit at any time. I understand that I have the right to inspect all information obtained and/or recorded in the course of the telemedicine visit and may receive copies of available information for a reasonable fee.  I understand that some of the potential risks of receiving the Services via telemedicine include:   Delay or interruption in medical evaluation due to technological equipment failure or disruption;  Information transmitted may not be sufficient (e.g. poor resolution of images) to allow for appropriate medical decision making by the Practitioner; and/or  In rare instances, security protocols could fail, causing a breach of personal health information.   Furthermore, I acknowledge that it is my responsibility to provide information about my medical history, conditions and care that is complete and accurate to the best of my ability. I acknowledge that Practitioner's advice, recommendations, and/or decision may be based on factors not within their control, such as incomplete or inaccurate data provided by me or distortions of diagnostic images or specimens that may result from electronic transmissions. I understand that the practice of medicine is not an exact science and that Practitioner makes no warranties or guarantees regarding treatment outcomes. I acknowledge that I will receive a copy of this consent concurrently upon execution via email to the email address I last provided but may also request a printed copy by calling the office of the [facility].  I understand that my insurance will be billed for this visit.   I have read or had this consent read to me.  I understand the contents of this consent, which adequately explains the benefits and risks of the Services being  provided via telemedicine.  I have been provided ample opportunity to ask questions regarding this consent and the Services and have had my questions answered to my satisfaction.  I give my informed consent for the services to be provided through the use of telemedicine in my medical care  By participating in this telemedicine visit I agree to the above.

## 2018-12-28 ENCOUNTER — Other Ambulatory Visit: Payer: Self-pay

## 2018-12-28 ENCOUNTER — Observation Stay (HOSPITAL_COMMUNITY)
Admission: EM | Admit: 2018-12-28 | Discharge: 2018-12-29 | Disposition: A | Discharge status: Home / Self Care | Payer: Medicare HMO | Attending: Cardiovascular Disease | Admitting: Cardiovascular Disease

## 2018-12-28 ENCOUNTER — Encounter (HOSPITAL_COMMUNITY): Payer: Self-pay | Admitting: Emergency Medicine

## 2018-12-28 DIAGNOSIS — Z881 Allergy status to other antibiotic agents status: Secondary | ICD-10-CM | POA: Insufficient documentation

## 2018-12-28 DIAGNOSIS — Z880 Allergy status to penicillin: Secondary | ICD-10-CM | POA: Diagnosis not present

## 2018-12-28 DIAGNOSIS — Z79899 Other long term (current) drug therapy: Secondary | ICD-10-CM | POA: Insufficient documentation

## 2018-12-28 DIAGNOSIS — I4891 Unspecified atrial fibrillation: Secondary | ICD-10-CM | POA: Insufficient documentation

## 2018-12-28 DIAGNOSIS — E7800 Pure hypercholesterolemia, unspecified: Secondary | ICD-10-CM | POA: Diagnosis not present

## 2018-12-28 DIAGNOSIS — Z882 Allergy status to sulfonamides status: Secondary | ICD-10-CM | POA: Insufficient documentation

## 2018-12-28 DIAGNOSIS — Z7901 Long term (current) use of anticoagulants: Secondary | ICD-10-CM | POA: Insufficient documentation

## 2018-12-28 DIAGNOSIS — Z885 Allergy status to narcotic agent status: Secondary | ICD-10-CM | POA: Diagnosis not present

## 2018-12-28 DIAGNOSIS — E785 Hyperlipidemia, unspecified: Secondary | ICD-10-CM | POA: Insufficient documentation

## 2018-12-28 DIAGNOSIS — I482 Chronic atrial fibrillation, unspecified: Secondary | ICD-10-CM | POA: Diagnosis not present

## 2018-12-28 DIAGNOSIS — I4892 Unspecified atrial flutter: Secondary | ICD-10-CM | POA: Diagnosis not present

## 2018-12-28 DIAGNOSIS — I10 Essential (primary) hypertension: Secondary | ICD-10-CM | POA: Diagnosis present

## 2018-12-28 LAB — CBC
HCT: 40.8 % (ref 36.0–46.0)
Hemoglobin: 14.1 g/dL (ref 12.0–15.0)
MCH: 31.3 pg (ref 26.0–34.0)
MCHC: 34.6 g/dL (ref 30.0–36.0)
MCV: 90.5 fL (ref 80.0–100.0)
Platelets: 240 10*3/uL (ref 150–400)
RBC: 4.51 MIL/uL (ref 3.87–5.11)
RDW: 12.6 % (ref 11.5–15.5)
WBC: 7.4 10*3/uL (ref 4.0–10.5)
nRBC: 0 % (ref 0.0–0.2)

## 2018-12-28 LAB — BASIC METABOLIC PANEL
Anion gap: 11 (ref 5–15)
BUN: 14 mg/dL (ref 8–23)
CO2: 26 mmol/L (ref 22–32)
Calcium: 10.1 mg/dL (ref 8.9–10.3)
Chloride: 102 mmol/L (ref 98–111)
Creatinine, Ser: 0.89 mg/dL (ref 0.44–1.00)
GFR calc Af Amer: >60 mL/min (ref >60)
GFR calc non Af Amer: >60 mL/min (ref >60)
Glucose, Bld: 123 mg/dL — ABNORMAL HIGH (ref 70–99)
Potassium: 3.8 mmol/L (ref 3.5–5.1)
Sodium: 139 mmol/L (ref 135–145)

## 2018-12-28 LAB — TSH: TSH: 0.732 u[IU]/mL (ref 0.350–4.500)

## 2018-12-28 MED ORDER — SODIUM CHLORIDE 0.9% FLUSH
3.0000 mL | Freq: Two times a day (BID) | INTRAVENOUS | Status: DC
  Administered 2018-12-28: 3 mL via INTRAVENOUS

## 2018-12-28 MED ORDER — SODIUM CHLORIDE 0.9 % IV BOLUS
500.0000 mL | Freq: Once | INTRAVENOUS | Status: AC
  Administered 2018-12-28: 500 mL via INTRAVENOUS

## 2018-12-28 MED ORDER — SODIUM CHLORIDE 0.9 % IV SOLN
250.0000 mL | INTRAVENOUS | Status: DC
  Administered 2018-12-29: 15:00:00 via INTRAVENOUS

## 2018-12-28 MED ORDER — APIXABAN 5 MG PO TABS
5.0000 mg | ORAL_TABLET | Freq: Two times a day (BID) | ORAL | Status: DC
  Administered 2018-12-28 – 2018-12-29 (×2): 5 mg via ORAL
  Filled 2018-12-28 (×2): qty 1

## 2018-12-28 MED ORDER — SODIUM CHLORIDE 0.9% FLUSH: 3.0000 mL | INTRAVENOUS | Status: DC | PRN

## 2018-12-28 MED ORDER — ACETAMINOPHEN 325 MG PO TABS: 650.0000 mg | ORAL_TABLET | ORAL | Status: DC | PRN

## 2018-12-28 MED ORDER — DILTIAZEM HCL-DEXTROSE 100-5 MG/100ML-% IV SOLN (PREMIX)
5.0000 mg/h | INTRAVENOUS | Status: DC
  Administered 2018-12-28: 16:00:00 5 mg/h via INTRAVENOUS
  Administered 2018-12-29 (×3): 12.5 mg/h via INTRAVENOUS
  Filled 2018-12-28 (×4): qty 100

## 2018-12-28 MED ORDER — SIMVASTATIN 20 MG PO TABS
20.0000 mg | ORAL_TABLET | Freq: Every day | ORAL | Status: DC
  Administered 2018-12-28: 20 mg via ORAL
  Filled 2018-12-28 (×2): qty 1

## 2018-12-28 MED ORDER — DILTIAZEM HCL 25 MG/5ML IV SOLN
10.0000 mg | Freq: Once | INTRAVENOUS | Status: AC
  Administered 2018-12-28: 13:00:00 10 mg via INTRAVENOUS
  Filled 2018-12-28: qty 5

## 2018-12-28 MED ORDER — ONDANSETRON HCL 4 MG/2ML IJ SOLN: 4.0000 mg | Freq: Four times a day (QID) | INTRAMUSCULAR | Status: DC | PRN

## 2018-12-28 NOTE — ED Notes (Signed)
Attempted report 

## 2018-12-28 NOTE — ED Provider Notes (Signed)
[facility] Emergency Department Provider Note MRN:  [id number]  Arrival date & time: 12/28/18     Chief Complaint   Atrial Fibrillation   History of Present Illness   Alexandria Mcdowell is a 78 y.o. year-old female with a history of hypertension, hyperlipidemia, A. fib with RVR presenting to the ED with chief complaint of atrial fibrillation.  Patient explains that she had a cardioversion procedure last week and was started on Eliquis twice a day.  She has been feeling well up until this morning at about 9 AM when she began feeling general malaise, felt that her heart was beating fast.  Denies fever, no headache or vision change, no vomiting, no chest pain, no shortness of breath, no abdominal pain, no dysuria, no numbness or weakness to the arms or legs.  Symptoms are constant, no exacerbating relieving factors.  Took her last dose of Eliquis this morning.  Review of Systems  A complete 10 system review of systems was obtained and all systems are negative except as noted in the HPI and PMH.   Patient's Health History    Past Medical History:  Diagnosis Date  . Allergy   . Anemia   . Hyperlipidemia   . Hypertension   . Migraine     Past Surgical History:  Procedure Laterality Date  . APPENDECTOMY    . BREAST BIOPSY    . CATARACT EXTRACTION  05/05/13   right eye   . COLONOSCOPY  2006  . [place]  . WISDOM TOOTH EXTRACTION      Family History  Problem Relation Age of Onset  . Hypertension Mother   . Hyperlipidemia Mother   . Cancer Mother        breast/colon  . Heart disease Mother   . Diabetes Father   . Emphysema Father   . Hyperlipidemia Sister   . Hypertension Sister   . Hypothyroidism Sister     Social History   Socioeconomic History  . Marital status: Married    Spouse name: Not on file  . Number of children: Not on file  . Years of education: Not on file  . Highest education level: Not on file  Occupational  History  . Not on file  Social Needs  . Financial resource strain: Not on file  . Food insecurity:    Worry: Not on file    Inability: Not on file  . Transportation needs:    Medical: Not on file    Non-medical: Not on file  Tobacco Use  . Smoking status: Never Smoker  . Smokeless tobacco: Never Used  Substance and Sexual Activity  . Alcohol use: No  . Drug use: No  . Sexual activity: Yes  Lifestyle  . Physical activity:    Days per week: Not on file    Minutes per session: Not on file  . Stress: Not on file  Relationships  . Social connections:    Talks on phone: Not on file    Gets together: Not on file    Attends religious service: Not on file    Active member of club or organization: Not on file    Attends meetings of clubs or organizations: Not on file    Relationship status: Not on file  . Intimate partner violence:    Fear of current or ex partner: Not on file    Emotionally abused: Not on file    Physically abused: Not on file  Forced sexual activity: Not on file  Other Topics Concern  . 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 Laymen's guide and forms provided.    Reviewed Aug '15      Exercsie:5 days a week, treadmill   Diet: healthy, weight watchers.     Physical Exam  Vital Signs and Nursing Notes reviewed Vitals:   12/28/18 1230 12/28/18 1300  BP: (!) 146/108 91/73  Pulse: (!) 136   Resp: (!) 26 12  Temp:    SpO2: 97%     CONSTITUTIONAL: Well-appearing, NAD NEURO:  Alert and oriented x 3, no focal deficits EYES:  eyes equal and reactive ENT/NECK:  no LAD, no JVD CARDIO: Tachycardic and irregular rate, well-perfused, normal S1 and S2 PULM:  CTAB no wheezing or rhonchi GI/GU:  normal  bowel sounds, non-distended, non-tender MSK/SPINE:  No gross deformities, no edema SKIN:  no rash, atraumatic PSYCH:  Appropriate speech and behavior  Diagnostic and Interventional Summary    EKG Interpretation  Date/Time:  Tuesday December 28 2018 11:12:10 EDT Ventricular Rate:  135 PR Interval:    QRS Duration: 78 QT Interval:  298 QTC Calculation: 447 R Axis:   74 Text Interpretation:  Atrial fibrillation with rapid ventricular response Marked ST abnormality, possible inferior subendocardial injury Abnormal ECG Confirmed by Gerlene Fee [phone]) on 12/28/2018 1:07:14 PM      Labs Reviewed  CBC  BASIC METABOLIC PANEL    No orders to display    Medications  diltiazem (CARDIZEM) injection 10 mg (10 mg Intravenous Given 12/28/18 1257)  sodium chloride 0.9 % bolus 500 mL (500 mLs Intravenous New Bag/Given 12/28/18 1257)     Procedures Critical Care Critical Care Documentation Critical care time provided by me (excluding procedures): 32 minutes  Condition necessitating critical care: Atrial fibrillation with rapid ventricular response  Components of critical care management: reviewing of prior records, laboratory and imaging interpretation, frequent re-examination and reassessment of vital signs, administration of IV diltiazem, discussion with consulting services    ED Course and Medical Decision Making  I have reviewed the triage vital signs and the nursing notes.  Pertinent labs & imaging results that were available during my care of the patient were reviewed by me and considered in my medical decision making (see below for details).  Recurrent A. fib with RVR in this otherwise fairly healthy 78 year old female.  I was personally involved in her cardioversion last week.  Given the recurrence is less than 1 week later, will consult cardiology for any other ideas other than repeat cardioversion.  Discussed with Dr. Burt Knack of cardiology, who will come to evaluate the patient  and he explains that cardiology will likely admit for observation and attempt medical rate/rhythm control.  Barth Kirks. Sedonia Small, MD [place] [email or web address]  Final Clinical Impressions(s) / ED Diagnoses     ICD-10-CM   1. Atrial fibrillation with RVR [facility]) I48.91     ED Discharge Orders    None         Maudie Flakes, MD 12/28/18 1308

## 2018-12-28 NOTE — H&P (Signed)
Cardiology Admission History and Physical:   Patient ID: Alexandria Mcdowell MRN: [id number]; DOB: 10-06-1940   Admission date: 12/28/2018  Primary Care Provider: Jinny Sanders, MD Primary Cardiologist: Remotely Dr. Johnsie Cancel Primary Electrophysiologist: Afib clinic - recently seen by Malka So, PA-C  Chief Complaint:  Racing heart beat  Patient Profile:   Alexandria Mcdowell is a 78 y.o. female with a PMH of HTN, HLD, and recently diagnosed atrial fibrillation on eliquis, who presented to the ED and was found to be in atrial fibrillation with RVR.  History of Present Illness:   Alexandria Mcdowell was in her usual state of health until around 9am this morning when she notice acute onset malaise and a racing heart beat. She was recently evaluated in the ED on 12/22/2018 after presenting with palpitations and SOB where she was found to have new onset atrial fibrillation. Given recent onset of symptoms, she underwent DCCV in the ED with successful return to [facility]. She was discharge home on eliquis for stoke ppx and her previously prescribed atenolol for rate control. She was evaluated by the afib clinic, Clint Fenton PA-C, 12/27/2018 during a virtual visit, and was without recurrent symptoms or complaints at that time. Unfortunately she had recurrent palpitations this morning prompting her to present to the ED. EKG on arrival confirmed recurrent atrial fibrillation with RVR with rate 135 and diffuse ST abnormalities. She denies any missed doses of eliquis since starting it 12/22/2018 and last dose was this morning.   No prior heart disease history. No clear inciting incident for onset of atrial fibrillation - she denies problems with her thyroid, snoring, ETOH use, chest pain suggestive of ACS, or recent infection or steroid use. At the time of this evaluation the patient is chest pain-free.  Her heart rate remains in the 130s on average and she does not feel palpitations when she is resting in still.  ED course:  tachycardic to the 140s, BP stable though intermittently soft, occasionally tachypneic, otherwise VSS. Labs notable for electrolytes wnl, Cr 0.89, CBC wnl. EKG showed Afib with RVR, rate 135 with diffuse ST-T wave abnormalities. S/p diltiazem 10mg  IV push x1 without significant improvement in HR. Diltiazem gtt initiated  Cardiology admitting for further management of atrial fibrillation with RVR.    Past Medical History:  Diagnosis Date   Allergy    Anemia    Hyperlipidemia    Hypertension    Migraine     Past Surgical History:  Procedure Laterality Date   APPENDECTOMY     BREAST BIOPSY     CATARACT EXTRACTION  05/05/13   right eye    COLONOSCOPY  2006   ECTOPIC PREGNANCY SURGERY  1970   WISDOM TOOTH EXTRACTION       Medications Prior to Admission: Prior to Admission medications   Medication Sig Start Date End Date Taking? Authorizing Provider  alendronate (FOSAMAX) 35 MG tablet Take 35 mg by mouth every 7 (seven) days. Take with a full glass of water on an empty stomach.   Yes [provider]  apixaban (ELIQUIS) 5 MG TABS tablet Take 1 tablet (5 mg total) by mouth 2 (two) times daily for 30 days. 12/22/18 01/21/19 Yes Petrucelli, Samantha R, PA-C  atenolol (TENORMIN) 50 MG tablet Take 1 tablet (50 mg total) by mouth daily. 07/02/18  Yes Bedsole, Amy E, MD  lisinopril-hydrochlorothiazide (PRINZIDE,ZESTORETIC) 10-12.5 MG tablet Take 1 tablet by mouth daily. 07/02/18  Yes Bedsole, Amy E, MD  Multiple Vitamins-Minerals (MULTIVITAMIN WITH MINERALS) tablet  Take 1 tablet by mouth daily.   Yes [provider]  Omega-3 Fatty Acids (FISH OIL) 1200 MG CAPS Take 1 capsule by mouth daily.   Yes [provider]  simvastatin (ZOCOR) 20 MG tablet Take 1 tablet (20 mg total) by mouth daily. 07/02/18  Yes Bedsole, Amy E, MD  sodium chloride (OCEAN) 0.65 % SOLN nasal spray Place 1 spray into both nostrils as needed for congestion.   Yes [provider]      Allergies:    Allergies  Allergen Reactions   Clindamycin Hcl Nausea And Vomiting    nervous   Codeine Nausea And Vomiting   Erythromycin Nausea And Vomiting   Tetracycline Nausea And Vomiting   Penicillins Rash    Did it involve swelling of the face/tongue/throat, SOB, or low BP? No Did it involve sudden or severe rash/hives, skin peeling, or any reaction on the inside of your mouth or nose? Yes Did you need to seek medical attention at a hospital or doctor's office? Yes When did it last happen?childhood If all above answers are NO, may proceed with cephalosporin use.    Sulfonamide Derivatives Nausea And Vomiting and Rash    Social History:   Social History   Socioeconomic History   Marital status: Married    Spouse name: Not on file   Number of children: Not on file   Years of education: Not on file   Highest education level: Not on file  Occupational History   Not on file  Social Needs   Financial resource strain: Not on file   Food insecurity:    Worry: Not on file    Inability: Not on file   Transportation needs:    Medical: Not on file    Non-medical: Not on file  Tobacco Use   Smoking status: Never Smoker   Smokeless tobacco: Never Used  Substance and Sexual Activity   Alcohol use: No   Drug use: No   Sexual activity: Yes  Lifestyle   Physical activity:    Days per week: Not on file    Minutes per session: Not on file   Stress: Not on file  Relationships   Social connections:    Talks on phone: Not on file    Gets together: Not on file    Attends religious service: Not on file    Active member of club or organization: Not on file    Attends meetings of clubs or organizations: Not on file    Relationship status: Not on file   Intimate partner violence:    Fear of current or ex partner: Not on file    Emotionally abused: Not on file    Physically abused: Not on file    Forced sexual activity: Not on file  Other  Topics Concern   Not on file  Social History Narrative   HSG, [employer]-elementary ed   Married '64   3 sons-'66, '68, '71; 6 grandchildren   Work: taught school for 2 years , 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 Laymen's guide and forms provided.    Reviewed Aug '15      Exercsie:5 days a week, treadmill   Diet: healthy, weight watchers.    Family History:   The patient's family history includes Cancer in her mother; Diabetes in her father; Emphysema in her father; Heart disease in her mother;  Hyperlipidemia in her mother and sister; Hypertension in her mother and sister; Hypothyroidism in her sister.    ROS:  Please see the history of present illness.  All other ROS reviewed and negative.     Physical Exam/Data:   Vitals:   12/28/18 1230 12/28/18 1300 12/28/18 1304 12/28/18 1330  BP: (!) 146/108 91/73 108/81 (!) 125/107  Pulse: (!) 136  (!) 124 (!) 131  Resp: (!) 26 12 15  (!) 21  Temp:      TempSrc:      SpO2: 97%  98% 100%  Weight:      Height:       No intake or output data in the 24 hours ending 12/28/18 1500 Last 3 Weights 12/28/2018 12/22/2018 12/22/2018  Weight (lbs) 121 lb 14.6 oz 121 lb 14.6 oz 122 lb  Weight (kg) 55.3 kg 55.3 kg 55.339 kg     Body mass index is 21.6 kg/m.  General:  Well nourished, well developed, in no acute distress.  A pleasant elderly woman. HEENT: normal Lymph: no adenopathy Neck: no JVD Endocrine:  No thryomegaly Vascular: No carotid bruits; FA pulses 2+ bilaterally Cardiac: Irregularly irregular, tachycardic; no murmur  Lungs:  clear to auscultation bilaterally, no wheezing, rhonchi or rales  Abd: soft, nontender, no hepatomegaly  Ext: no edema Musculoskeletal:  No deformities, BUE and BLE strength normal and equal Skin: warm and dry  Neuro:  CNs 2-12 intact, no focal abnormalities noted Psych:  Normal affect    EKG:  The ECG that  was done 12/28/2018 was personally reviewed and demonstrates Afib with RVR, rate 135 with diffuse ST-T wave abnormalities.  Relevant CV Studies: Echocardiogram 2015: Study Conclusions  - Left ventricle: The cavity size was normal. Wall thickness was normal. Systolic function was normal. The estimated ejection fraction was in the range of 55% to 60%. Wall motion was normal; there were no regional wall motion abnormalities. 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 E/E&' ratio is >15, suggesting elevated LV filling pressure. - Mitral valve: Mildly thickened leaflets . Mild late systolic bileaflet prolapse. There was moderate regurgitation. - Left atrium: The atrium was moderately dilated (39 ml/m2). - Right atrium: The atrium was mildly dilated. - Atrial septum: No defect or patent foramen ovale was identified.  Impressions:  - LVEF 55-60%, normal wall thickness, moderate LAE, mild RAE, mild MV thickening with late systolic bileaflet prolapse, moderate MR, Stage 2 diastolic dysfunction with elevated LV filing pressure.  Laboratory Data:  Chemistry Recent Labs  Lab 12/22/18 1127 12/28/18 1243  NA 139 139  K 4.0 3.8  CL 103 102  CO2 22 26  GLUCOSE 115* 123*  BUN 17 14  CREATININE 1.01* 0.89  CALCIUM 10.4* 10.1  GFRNONAA 53* >60  GFRAA >60 >60  ANIONGAP 14 11    No results for input(s): PROT, ALBUMIN, AST, ALT, ALKPHOS, BILITOT in the last 168 hours. Hematology Recent Labs  Lab 12/22/18 1127 12/28/18 1243  WBC 8.6 7.4  RBC 4.76 4.51  HGB 14.6 14.1  HCT 42.5 40.8  MCV 89.3 90.5  MCH 30.7 31.3  MCHC 34.4 34.6  RDW 12.4 12.6  PLT 245 240   Cardiac EnzymesNo results for input(s): TROPONINI in the last 168 hours. No results for input(s): TROPIPOC in the last 168 hours.  BNPNo results for input(s): BNP, PROBNP in the last 168 hours.  DDimer No results for input(s): DDIMER in the last 168 hours.  Radiology/Studies:  No results  found.  Assessment and Plan:  1. Atrial fibrillation with RVR: patient presented with acute onset malaise and racing heart beat around 9am this morning. She was diagnosed with new onset afib 12/22/2018 after presenting to the ED with palpitations and SOB. She underwent successful DCCV at that time and was started on eliquis for CHA2DS2-VASc Score of 4 (HTN, Female, and Age >56). On atenolol 50mg  daily for rate control. She reported feeling asymptomatic until this morning. EKG showed Afib with RVR, rate 135 with diffuse ST-T wave abnormalities. S/p diltiazem 10mg  IV push x1 without significant improvement in HR. Last dose of eliquis was this morning.   - Continue eliquis for stroke ppx - Continue diltiazem gtt for rate control - Check a TSH (last 06/2018 was wnl) - Recommended for an echocardiogram outpatient after COVID-19 restrictions  - Will keep NPO after MN for possible cardioversion tomorrow if still in Afib  2. HTN: BP elevated - hold home HCTZ/Lisinopril to allow room in BP for management of #1 - Further recommendations pending response to management for #1  3. HLD: on statin - Continue simvastatin and Omega 3  Severity of Illness: The appropriate patient status for this patient is OBSERVATION. Observation status is judged to be reasonable and necessary in order to provide the required intensity of service to ensure the patient's safety. The patient's presenting symptoms, physical exam findings, and initial radiographic and laboratory data in the context of their medical condition is felt to place them at decreased risk for further clinical deterioration. Furthermore, it is anticipated that the patient will be medically stable for discharge from the hospital within 2 midnights of admission. The following factors support the patient status of observation.   " The patient's presenting symptoms include palpitations, SOB. " The physical exam findings include cardiac exam with IRIR. " The  initial radiographic and laboratory data are an EKG with atrial fibrillation with RVR.  For questions or updates, please contact [place] Please consult [email or web address] for contact info under   Signed, Abigail Butts, PA-C  12/28/2018 3:00 PM   Patient seen, examined. Available data reviewed. Agree with findings, assessment, and plan as outlined by Roby Lofts, PA-C.  The findings documented above reflect my personal findings from physical examination of this patient today.  She has recurrent atrial fibrillation with RVR after undergoing cardioversion in the emergency department last week.  She had a telemedicine visit with the atrial fibrillation clinic last week.  She was doing well until this morning when she developed recurrent sudden onset of tachycardia and palpitations.  She presents back to the emergency department with recurrent atrial fibrillation.  The patient has been compliant with oral anticoagulation since she was started on apixaban last week.  She has no signs or symptoms of angina or congestive heart failure.  I have recommended overnight admission, initiation of IV diltiazem, continuation of anticoagulation, and cardioversion tomorrow if she does not spontaneously convert overnight.  I have reviewed the risks, indications, and alternatives to cardioversion with the patient.  She has not missed any doses of apixaban since undergoing initial cardioversion 1 week ago.  I would plan to transition the patient from IV to oral Cardizem and will continue her beta-blocker at its current dose.  The patient may need formal EP evaluation if she has continued issues with atrial fibrillation or fails cardioversion tomorrow.  Sherren Mocha, M.D. 12/28/2018 4:01 PM

## 2018-12-28 NOTE — ED Triage Notes (Signed)
Patient reports being here last week and was cardioverted. Starting this morning she felt her pulse was irregular. Patient denies any pain or shortness of breath. Patient states she took eliquis this am.

## 2018-12-28 NOTE — Progress Notes (Signed)
Report received from [facility], pt has been at diltiazem 7.5mg  and HR still >120's, per charge nurse pt has 1 more titration then pt will need to go to higher level, discussed with Thurmond Butts and will bring pt up

## 2018-12-28 NOTE — ED Notes (Signed)
ED TO INPATIENT HANDOFF REPORT  ED Nurse Name and Phone #: Thurmond Butts [phone]  S Name/Age/Gender Alexandria Mcdowell 78 y.o. female Room/Bed: 029C/029C  Code Status   Code Status: Not on file  Home/SNF/Other Home Patient oriented to: self, place, time and situation Is this baseline? Yes   Triage Complete: Triage complete  Chief Complaint high hrt rate  Triage Note Patient reports being here last week and was cardioverted. Starting this morning she felt her pulse was irregular. Patient denies any pain or shortness of breath. Patient states she took eliquis this am.    Allergies Allergies  Allergen Reactions  . Clindamycin Hcl Nausea And Vomiting    nervous  . Codeine Nausea And Vomiting  . Erythromycin Nausea And Vomiting  . Tetracycline Nausea And Vomiting  . Penicillins Rash    Did it involve swelling of the face/tongue/throat, SOB, or low BP? No Did it involve sudden or severe rash/hives, skin peeling, or any reaction on the inside of your mouth or nose? Yes Did you need to seek medical attention at a hospital or doctor's office? Yes When did it last happen?childhood If all above answers are "NO", may proceed with cephalosporin use.   . Sulfonamide Derivatives Nausea And Vomiting and Rash    Level of Care/Admitting Diagnosis ED Disposition    ED Disposition Condition Comment   Admit  Hospital Area: [facility] [[id number]]  Level of Care: Telemetry Cardiac [103]  Diagnosis: Atrial flutter with rapid ventricular response [facility]) [[id number]]  Admitting Physician: [place], [place]  Attending Physician: Burt Knack, MICHAEL [[id number]]  Possible Covid Disease Patient Isolation: N/A  PT Class (Do Not Modify): Observation [104]  PT Acc Code (Do Not Modify): Observation [10022]       B Medical/Surgery History Past Medical History:  Diagnosis Date  . Allergy   . Anemia   . Hyperlipidemia   . Hypertension   . Migraine    Past Surgical History:   Procedure Laterality Date  . APPENDECTOMY    . BREAST BIOPSY    . CATARACT EXTRACTION  05/05/13   right eye   . COLONOSCOPY  2006  . [place]  . WISDOM TOOTH EXTRACTION       A IV Location/Drains/Wounds Patient Lines/Drains/Airways Status   Active Line/Drains/Airways    Name:   Placement date:   Placement time:   Site:   Days:   Peripheral IV 12/28/18 Right Antecubital   12/28/18    1233    Antecubital   less than 1          Intake/Output Last 24 hours  Intake/Output Summary (Last 24 hours) at 12/28/2018 1600 Last data filed at 12/28/2018 1540 Gross per 24 hour  Intake 500 ml  Output -  Net 500 ml    Labs/Imaging Results for orders placed or performed during the hospital encounter of 12/28/18 (from the past 48 hour(s))  CBC     Status: None   Collection Time: 12/28/18 12:43 PM  Result Value Ref Range   WBC 7.4 4.0 - 10.5 K/uL   RBC 4.51 3.87 - 5.11 MIL/uL   Hemoglobin 14.1 12.0 - 15.0 g/dL   HCT 40.8 36.0 - 46.0 %   MCV 90.5 80.0 - 100.0 fL   MCH 31.3 26.0 - 34.0 pg   MCHC 34.6 30.0 - 36.0 g/dL   RDW 12.6 11.5 - 15.5 %   Platelets 240 150 - 400 K/uL   nRBC 0.0 0.0 - 0.2 %  Comment: Performed at [facility], [place] [address] [place], [place] [address]  Basic metabolic panel     Status: Abnormal   Collection Time: 12/28/18 12:43 PM  Result Value Ref Range   Sodium 139 135 - 145 mmol/L   Potassium 3.8 3.5 - 5.1 mmol/L   Chloride 102 98 - 111 mmol/L   CO2 26 22 - 32 mmol/L   Glucose, Bld 123 (H) 70 - 99 mg/dL   BUN 14 8 - 23 mg/dL   Creatinine, Ser 0.89 0.44 - 1.00 mg/dL   Calcium 10.1 8.9 - 10.3 mg/dL   GFR calc non Af Amer >60 >60 mL/min   GFR calc Af Amer >60 >60 mL/min   Anion gap 11 5 - 15    Comment: Performed at [facility] [address], [place],  [address]   No results found.  Pending Labs [employer] (From admission, onward)    Start     Ordered   Signed and Held  TSH  Add-on,   R     Signed  and Held   Signed and Held  Basic metabolic panel  Tomorrow morning,   R     Signed and Held   Signed and Held  CBC  Tomorrow morning,   R     Signed and Held          Vitals/Pain Today's Vitals   12/28/18 1304 12/28/18 1330 12/28/18 1500 12/28/18 1530  BP: 108/81 (!) 125/107 139/88 105/78  Pulse: (!) 124 (!) 131 (!) 135 (!) 147  Resp: 15 (!) 21 (!) 21 17  Temp:      TempSrc:      SpO2: 98% 100% 97% 97%  Weight:      Height:      PainSc:        Isolation Precautions No active isolations  Medications Medications  diltiazem (CARDIZEM) 100 mg in dextrose 5% 157mL (1 mg/mL) infusion (5 mg/hr Intravenous New Bag/Given 12/28/18 1540)  diltiazem (CARDIZEM) injection 10 mg (10 mg Intravenous Given 12/28/18 1257)  sodium chloride 0.9 % bolus 500 mL (0 mLs Intravenous Stopped 12/28/18 1540)    Mobility walks Low fall risk   Focused Assessments Cardiac Assessment Handoff:  Cardiac Rhythm: Atrial fibrillation No results found for: CKTOTAL, CKMB, CKMBINDEX, TROPONINI No results found for: DDIMER Does the Patient currently have chest pain? No      R Recommendations: See Admitting Provider Note  Report given to:   Additional Notes:

## 2018-12-28 NOTE — Progress Notes (Signed)
Pt admitted with afib RVR, Dr Burt Knack on floor, pt hr was 80's but back to 130's, increased diltiazem qtt to 12.5

## 2018-12-29 ENCOUNTER — Telehealth: Payer: Self-pay | Admitting: Family Medicine

## 2018-12-29 ENCOUNTER — Encounter (HOSPITAL_COMMUNITY)
Admission: EM | Disposition: A | Discharge status: Home / Self Care | Payer: Self-pay | Source: Home / Self Care | Attending: Emergency Medicine

## 2018-12-29 ENCOUNTER — Observation Stay (HOSPITAL_COMMUNITY): Payer: Medicare HMO | Admitting: Anesthesiology

## 2018-12-29 ENCOUNTER — Encounter (HOSPITAL_COMMUNITY): Payer: Self-pay

## 2018-12-29 DIAGNOSIS — I482 Chronic atrial fibrillation, unspecified: Secondary | ICD-10-CM | POA: Diagnosis not present

## 2018-12-29 DIAGNOSIS — Z79899 Other long term (current) drug therapy: Secondary | ICD-10-CM | POA: Diagnosis not present

## 2018-12-29 DIAGNOSIS — E7800 Pure hypercholesterolemia, unspecified: Secondary | ICD-10-CM | POA: Diagnosis not present

## 2018-12-29 DIAGNOSIS — E785 Hyperlipidemia, unspecified: Secondary | ICD-10-CM | POA: Diagnosis not present

## 2018-12-29 DIAGNOSIS — I4892 Unspecified atrial flutter: Secondary | ICD-10-CM | POA: Diagnosis not present

## 2018-12-29 DIAGNOSIS — Z881 Allergy status to other antibiotic agents status: Secondary | ICD-10-CM | POA: Diagnosis not present

## 2018-12-29 DIAGNOSIS — Z7901 Long term (current) use of anticoagulants: Secondary | ICD-10-CM | POA: Diagnosis not present

## 2018-12-29 DIAGNOSIS — I4891 Unspecified atrial fibrillation: Secondary | ICD-10-CM | POA: Diagnosis not present

## 2018-12-29 DIAGNOSIS — I10 Essential (primary) hypertension: Secondary | ICD-10-CM | POA: Diagnosis not present

## 2018-12-29 DIAGNOSIS — Z880 Allergy status to penicillin: Secondary | ICD-10-CM | POA: Diagnosis not present

## 2018-12-29 HISTORY — PX: CARDIOVERSION: SHX1299

## 2018-12-29 LAB — BASIC METABOLIC PANEL
Anion gap: 11 (ref 5–15)
BUN: 15 mg/dL (ref 8–23)
CO2: 25 mmol/L (ref 22–32)
Calcium: 9.4 mg/dL (ref 8.9–10.3)
Chloride: 104 mmol/L (ref 98–111)
Creatinine, Ser: 0.93 mg/dL (ref 0.44–1.00)
GFR calc Af Amer: >60 mL/min (ref >60)
GFR calc non Af Amer: 59 mL/min — ABNORMAL LOW (ref >60)
Glucose, Bld: 117 mg/dL — ABNORMAL HIGH (ref 70–99)
Potassium: 3.5 mmol/L (ref 3.5–5.1)
Sodium: 140 mmol/L (ref 135–145)

## 2018-12-29 LAB — CBC
HCT: 39.4 % (ref 36.0–46.0)
Hemoglobin: 13.7 g/dL (ref 12.0–15.0)
MCH: 30.9 pg (ref 26.0–34.0)
MCHC: 34.8 g/dL (ref 30.0–36.0)
MCV: 88.7 fL (ref 80.0–100.0)
Platelets: 235 10*3/uL (ref 150–400)
RBC: 4.44 MIL/uL (ref 3.87–5.11)
RDW: 12.6 % (ref 11.5–15.5)
WBC: 6.6 10*3/uL (ref 4.0–10.5)
nRBC: 0 % (ref 0.0–0.2)

## 2018-12-29 LAB — MAGNESIUM: Magnesium: 1.7 mg/dL (ref 1.7–2.4)

## 2018-12-29 SURGERY — CARDIOVERSION
Anesthesia: General

## 2018-12-29 MED ORDER — LIDOCAINE 2% (20 MG/ML) 5 ML SYRINGE
INTRAMUSCULAR | Status: DC | PRN
  Administered 2018-12-29: 20 mg via INTRAVENOUS

## 2018-12-29 MED ORDER — DILTIAZEM HCL ER COATED BEADS 240 MG PO CP24: 240.0000 mg | ORAL_CAPSULE | Freq: Every day | ORAL | 3 refills | Status: DC

## 2018-12-29 MED ORDER — DILTIAZEM HCL ER COATED BEADS 240 MG PO CP24
240.0000 mg | ORAL_CAPSULE | Freq: Every day | ORAL | Status: DC
  Administered 2018-12-29: 240 mg via ORAL
  Filled 2018-12-29: qty 1

## 2018-12-29 MED ORDER — ATORVASTATIN CALCIUM 10 MG PO TABS
10.0000 mg | ORAL_TABLET | Freq: Every day | ORAL | Status: DC
  Administered 2018-12-29: 10 mg via ORAL
  Filled 2018-12-29: qty 1

## 2018-12-29 MED ORDER — PROPOFOL 10 MG/ML IV BOLUS
INTRAVENOUS | Status: DC | PRN
  Administered 2018-12-29: 50 mg via INTRAVENOUS

## 2018-12-29 NOTE — Discharge Summary (Signed)
Discharge Summary    Patient ID: MARYAN SIVAK MRN: [id number]; DOB: 04/13/1941  Admit date: 12/28/2018 Discharge date: 12/29/2018  Primary Care Provider: Jinny Sanders, MD  Primary Cardiologist: Jenkins Rouge, MD  Primary Electrophysiologist:  Malka So, [facility]  Discharge Diagnoses    Principal Problem:   Atrial flutter with rapid ventricular response [facility]) Active Problems:   Hypercholesterolemia   Essential hypertension   Allergies Allergies  Allergen Reactions  . Clindamycin Hcl Nausea And Vomiting    nervous  . Codeine Nausea And Vomiting  . Erythromycin Nausea And Vomiting  . Tetracycline Nausea And Vomiting  . Penicillins Rash    Did it involve swelling of the face/tongue/throat, SOB, or low BP? No Did it involve sudden or severe rash/hives, skin peeling, or any reaction on the inside of your mouth or nose? Yes Did you need to seek medical attention at a hospital or doctor's office? Yes When did it last happen?childhood If all above answers are "NO", may proceed with cephalosporin use.   . Sulfonamide Derivatives Nausea And Vomiting and Rash    Diagnostic Studies/Procedures    DCCV 12/29/18: The patient has been on adequate anticoagulation.   Synchronous cardioversion was performed at 120 joules. The cardioversion was successful   History of Present Illness     Alexandria Mcdowell is a 78 y.o. female with a PMH of HTN, HLD, and recently diagnosed atrial fibrillation on eliquis, who presented to the ED on 12/28/18 and was found to be in atrial fibrillation with RVR.  Ms. Noble was in her usual state of health until around 9am 12/28/18 when she notice acute onset malaise and a racing heart beat. She was recently evaluated in the ED on 12/22/2018 after presenting with palpitations and SOB where she was found to have new onset atrial fibrillation. Given recent onset of symptoms, she underwent DCCV in the ED with successful return to NSR. She was discharge  home on eliquis for stoke ppx and her previously prescribed atenolol for rate control. She was evaluated by the afib clinic, Clint Fenton PA-C, 12/27/2018 during a virtual visit, and was without recurrent symptoms or complaints at that time. Unfortunately she had recurrent palpitations this morning prompting her to present to the ED. EKG on arrival confirmed recurrent atrial fibrillation with RVR with rate 135 and diffuse ST abnormalities. She denies any missed doses of eliquis since starting it 12/22/2018 and last dose was this morning.   No prior heart disease history. No clear inciting incident for onset of atrial fibrillation - she denies problems with her thyroid, snoring, ETOH use, chest pain suggestive of ACS, or recent infection or steroid use. At the time of this evaluation the patient is chest pain-free.  Her heart rate remains in the 130s on average and she does not feel palpitations when she is resting in still.  ED course: tachycardic to the 140s, BP stable though intermittently soft, occasionally tachypneic, otherwise VSS. Labs notable for electrolytes wnl, Cr 0.89, CBC wnl. EKG showed Afib with RVR, rate 135 with diffuse ST-T wave abnormalities. S/p diltiazem 10mg  IV push x1 without significant improvement in HR. Diltiazem gtt initiated  Cardiology admitted for further management of atrial fibrillation with RVR.   Hospital Course     Consultants: none  Atrial fibrillation with RVR Recurrent atrial fibrillation. She was admitted to cardiology and started on a cardizem drip. DCCV was planned and completed on 12/29/18 with successful conversion to NSR. She tolerated the procedure well. She is now in  NSR in the 60s. Will change atenolol to 240 mg cardizem. Continue eliquis for stroke prevention.    Hypertension Starting cardizem as above, D/C atenolol. Restart HCTZ-lisinopril. She will monitor daily pressures.   Hyperlipidemia Continue statin.   Patient was seen and examined by Dr.  Burt Knack and deemed stable for discharge. Will message the Afib clinic for a telehealth follow up visit. Instructions were sent to her AVS.   _____________  Discharge Vitals Blood pressure 137/83, pulse 68, temperature 98.5 F (36.9 C), temperature source Oral, resp. rate 17, height 5' (1.524 m), weight 54.9 kg, SpO2 98 %.  Filed Weights   12/28/18 1226 12/28/18 1700 12/29/18 0425  Weight: 55.3 kg 55.4 kg 54.9 kg    Labs & Radiologic Studies    CBC Recent Labs    12/28/18 1243 12/29/18 0603  WBC 7.4 6.6  HGB 14.1 13.7  HCT 40.8 39.4  MCV 90.5 88.7  PLT 240 270   Basic Metabolic Panel Recent Labs    12/28/18 1243 12/29/18 0603  NA 139 140  K 3.8 3.5  CL 102 104  CO2 26 25  GLUCOSE 123* 117*  BUN 14 15  CREATININE 0.89 0.93  CALCIUM 10.1 9.4  MG  --  1.7   Liver Function Tests No results for input(s): AST, ALT, ALKPHOS, BILITOT, PROT, ALBUMIN in the last 72 hours. No results for input(s): LIPASE, AMYLASE in the last 72 hours. Cardiac Enzymes No results for input(s): CKTOTAL, CKMB, CKMBINDEX, TROPONINI in the last 72 hours. BNP Invalid input(s): POCBNP D-Dimer No results for input(s): DDIMER in the last 72 hours. Hemoglobin A1C No results for input(s): HGBA1C in the last 72 hours. Fasting Lipid Panel No results for input(s): CHOL, HDL, LDLCALC, TRIG, CHOLHDL, LDLDIRECT in the last 72 hours. Thyroid Function Tests Recent Labs    12/28/18 1854  TSH 0.732   _____________  Dg Chest Port 1 View  Result Date: 12/22/2018 CLINICAL DATA:  EKG with ST changes and tachycardia.  Palpitations. EXAM: PORTABLE CHEST 1 VIEW COMPARISON:  06/07/2014 FINDINGS: Cardiac silhouette is mildly enlarged. No mediastinal or hilar masses. No evidence of adenopathy. Mild linear scarring in the right middle lobe, stable. Lungs are otherwise clear. No pleural effusion or pneumothorax. Skeletal structures are grossly intact. IMPRESSION: No active disease. Electronically Signed   By: Lajean Manes M.D.   On: 12/22/2018 12:28   Disposition   Pt is being discharged home today in good condition.  Follow-up Plans & Appointments    Follow-up Information    Sherran Needs, NP Follow up in 1 week(s).   Specialties:  Nurse Practitioner, Cardiology Why:  Office will call with telehealth visit. Contact information: [place] [id number] [phone]          Discharge Instructions    Diet - low sodium heart healthy   Complete by:  As directed    Increase activity slowly   Complete by:  As directed       Discharge Medications   Allergies as of 12/29/2018      Reactions   Clindamycin Hcl Nausea And Vomiting   nervous   Codeine Nausea And Vomiting   Erythromycin Nausea And Vomiting   Tetracycline Nausea And Vomiting   Penicillins Rash   Did it involve swelling of the face/tongue/throat, SOB, or low BP? No Did it involve sudden or severe rash/hives, skin peeling, or any reaction on the inside of your mouth or nose? Yes Did you need to seek  medical attention at a hospital or doctor's office? Yes When did it last happen?childhood If all above answers are "NO", may proceed with cephalosporin use.   Sulfonamide Derivatives Nausea And Vomiting, Rash      Medication List    STOP taking these medications   atenolol 50 MG tablet Commonly known as:  TENORMIN     TAKE these medications   alendronate 35 MG tablet Commonly known as:  FOSAMAX Take 35 mg by mouth every 7 (seven) days. Take with a full glass of water on an empty stomach.   apixaban 5 MG Tabs tablet Commonly known as:  ELIQUIS Take 1 tablet (5 mg total) by mouth 2 (two) times daily for 30 days.   diltiazem 240 MG 24 hr capsule Commonly known as:  CARDIZEM CD Take 1 capsule (240 mg total) by mouth daily. Start taking on:  December 30, 2018   Fish Oil 1200 MG Caps Take 1 capsule by mouth daily.   lisinopril-hydrochlorothiazide 10-12.5 MG tablet Commonly known as:   PRINZIDE,ZESTORETIC Take 1 tablet by mouth daily.   multivitamin with minerals tablet Take 1 tablet by mouth daily.   simvastatin 20 MG tablet Commonly known as:  ZOCOR Take 1 tablet (20 mg total) by mouth daily.   sodium chloride 0.65 % Soln nasal spray Commonly known as:  OCEAN Place 1 spray into both nostrils as needed for congestion.        Acute coronary syndrome (MI, NSTEMI, STEMI, etc) this admission?: No.    Outstanding Labs/Studies     Duration of Discharge Encounter   Greater than 30 minutes including physician time.  Signed, Tami Lin Khrystina Bonnes, PA 12/29/2018, 5:22 PM

## 2018-12-29 NOTE — Transfer of Care (Signed)
Immediate Anesthesia Transfer of Care Note  Patient: Alexandria Mcdowell  Procedure(s) Performed: CARDIOVERSION (N/A )  Patient Location: Endoscopy Unit  Anesthesia Type:General  Level of Consciousness: awake, alert  and oriented  Airway & Oxygen Therapy: Patient Spontanous Breathing and Patient connected to nasal cannula oxygen  Post-op Assessment: Report given to RN, Post -op Vital signs reviewed and stable and Patient moving all extremities  Post vital signs: Reviewed and stable  Last Vitals:  Vitals Value Taken Time  BP    Temp    Pulse 52 12/29/2018  3:20 PM  Resp 22 12/29/2018  3:20 PM  SpO2 98 % 12/29/2018  3:20 PM  Vitals shown include unvalidated device data.  Last Pain:  Vitals:   12/29/18 1350  TempSrc: Oral  PainSc: 0-No pain         Complications: No apparent anesthesia complications

## 2018-12-29 NOTE — Telephone Encounter (Signed)
Best number [phone] Clair Gulling (spouse)  Called to let you know pt is in hospital @ [facility] and having a procedure this afternoon. Pt will be discharged today and she has a phone visit with you tomorrow 4/16.  He stated he didn't know if you would have the results from the procedure.  Do you want to keep appointment tomorrow or reschedule.  They didn't want to to virtual appointment

## 2018-12-29 NOTE — Telephone Encounter (Signed)
Appointment 4/21 spouse aware

## 2018-12-29 NOTE — Progress Notes (Signed)
Patient ready for discharge. 

## 2018-12-29 NOTE — Discharge Instructions (Addendum)
Information on my medicine - ELIQUIS (apixaban)  Why was Eliquis prescribed for you? Eliquis was prescribed for you to reduce the risk of a blood clot forming that can cause a stroke if you have a medical condition called atrial fibrillation (a type of irregular heartbeat).  What do You need to know about Eliquis ? Take your Eliquis TWICE DAILY - one tablet in the morning and one tablet in the evening with or without food. If you have difficulty swallowing the tablet whole please discuss with your pharmacist how to take the medication safely.  Take Eliquis exactly as prescribed by your doctor and DO NOT stop taking Eliquis without talking to the doctor who prescribed the medication.  Stopping may increase your risk of developing a stroke.  Refill your prescription before you run out.  After discharge, you should have regular check-up appointments with your healthcare provider that is prescribing your Eliquis.  In the future your dose may need to be changed if your kidney function or weight changes by a significant amount or as you get older.  What do you do if you miss a dose? If you miss a dose, take it as soon as you remember on the same day and resume taking twice daily.  Do not take more than one dose of ELIQUIS at the same time to make up a missed dose.  Important Safety Information A possible side effect of Eliquis is bleeding. You should call your healthcare provider right away if you experience any of the following: ? Bleeding from an injury or your nose that does not stop. ? Unusual colored urine (red or dark brown) or unusual colored stools (red or black). ? Unusual bruising for unknown reasons. ? A serious fall or if you hit your head (even if there is no bleeding).  Some medicines may interact with Eliquis and might increase your risk of bleeding or clotting while on Eliquis. To help avoid this, consult your healthcare provider or pharmacist prior to using any new  prescription or non-prescription medications, including herbals, vitamins, non-steroidal anti-inflammatory drugs (NSAIDs) and supplements.  This website has more information on Eliquis (apixaban): http://www.eliquis.com/eliquis/home   _________________________________________________________________________________________________  YOUR CARDIOLOGY TEAM HAS ARRANGED FOR AN E-VISIT FOR YOUR APPOINTMENT - PLEASE REVIEW IMPORTANT INFORMATION BELOW SEVERAL DAYS PRIOR TO YOUR APPOINTMENT  Due to the recent COVID-19 pandemic, we are transitioning in-person office visits to tele-medicine visits in an effort to decrease unnecessary exposure to our patients, their families, and staff. Medicare and most insurances are covering these visits without a copay needed. We also encourage you to sign up for MyChart if you have not already done so. You will need a smartphone if possible. For patients that do not have this, we can still complete the visit using a regular telephone but do prefer a smartphone to enable video when possible. You may have a family member that lives with you that can help. If possible, we also ask that you have a blood pressure cuff and scale at home to measure your blood pressure, heart rate and weight prior to your scheduled appointment. Patients with clinical needs that need an in-person evaluation and testing will still be able to come to the office if absolutely necessary. If you have any questions, feel free to call our office.    2-3 DAYS BEFORE YOUR APPOINTMENT  You will receive a telephone call from one of our [place] team members - your caller ID may say "Unknown caller." If this is a video  visit, we will walk you through how to set up your device to be able to complete the visit. We will remind you check your blood pressure, heart rate and weight prior to your scheduled appointment. If you have an Apple Watch or Kardia, please upload any pertinent ECG strips the day before or  morning of your appointment to [place]. Our staff will also make sure you have reviewed the consent and agree to move forward with your scheduled tele-health visit.     THE DAY OF YOUR APPOINTMENT  Approximately 15 minutes prior to your scheduled appointment, you will receive a telephone call from one of [place] team - your caller ID may say "Unknown caller."  Our staff will confirm medications, vital signs for the day and any symptoms you may be experiencing. Please have this information available prior to the time of visit start. It may also be helpful for you to have a pad of paper and pen handy for any instructions given during your visit. They will also walk you through joining the smartphone meeting if this is a video visit.    CONSENT FOR TELE-HEALTH VISIT - PLEASE REVIEW  I hereby voluntarily request, consent and authorize [facility] and its employed or contracted physicians, physician assistants, nurse practitioners or other licensed health care professionals (the Practitioner), to provide me with telemedicine health care services (the Services") as deemed necessary by the treating Practitioner. I acknowledge and consent to receive the Services by the Practitioner via telemedicine. I understand that the telemedicine visit will involve communicating with the Practitioner through live audiovisual communication technology and the disclosure of certain medical information by electronic transmission. I acknowledge that I have been given the opportunity to request an in-person assessment or other available alternative prior to the telemedicine visit and am voluntarily participating in the telemedicine visit.  I understand that I have the right to withhold or withdraw my consent to the use of telemedicine in the course of my care at any time, without affecting my right to future care or treatment, and that the Practitioner or I may terminate the telemedicine visit at any time. I understand  that I have the right to inspect all information obtained and/or recorded in the course of the telemedicine visit and may receive copies of available information for a reasonable fee.  I understand that some of the potential risks of receiving the Services via telemedicine include:   Delay or interruption in medical evaluation due to technological equipment failure or disruption;  Information transmitted may not be sufficient (e.g. poor resolution of images) to allow for appropriate medical decision making by the Practitioner; and/or   In rare instances, security protocols could fail, causing a breach of personal health information.  Furthermore, I acknowledge that it is my responsibility to provide information about my medical history, conditions and care that is complete and accurate to the best of my ability. I acknowledge that Practitioner's advice, recommendations, and/or decision may be based on factors not within their control, such as incomplete or inaccurate data provided by me or distortions of diagnostic images or specimens that may result from electronic transmissions. I understand that the practice of medicine is not an exact science and that Practitioner makes no warranties or guarantees regarding treatment outcomes. I acknowledge that I will receive a copy of this consent concurrently upon execution via email to the email address I last provided but may also request a printed copy by calling the office of [facility].  I understand that my insurance will be billed for this visit.   I have read or had this consent read to me.  I understand the contents of this consent, which adequately explains the benefits and risks of the Services being provided via telemedicine.   I have been provided ample opportunity to ask questions regarding this consent and the Services and have had my questions answered to my satisfaction.  I give my informed consent for the services to be provided  through the use of telemedicine in my medical care  By participating in this telemedicine visit I agree to the above.

## 2018-12-29 NOTE — CV Procedure (Signed)
    Cardioversion Note  Alexandria Mcdowell [id number] [birth year]  Procedure: DC Cardioversion Indications: Atrial fibrillation  Procedure Details Consent: Obtained Time Out: Verified patient identification, verified procedure, site/side was marked, verified correct patient position, special equipment/implants available, Radiology Safety Procedures followed,  medications/allergies/relevent history reviewed, required imaging and test results available.  Performed  The patient has been on adequate anticoagulation.  The patient received IV propofol administered by anesthesia staff for sedation.  Synchronous cardioversion was performed at 120 joules.  The cardioversion was successful   Complications: No apparent complications Patient did tolerate procedure well.   Alexandria Dawley, MD, [facility] 12/29/2018, 3:17 PM

## 2018-12-29 NOTE — Plan of Care (Signed)
?  Problem: Education: ?Goal: Knowledge of disease or condition will improve ?Outcome: Progressing ?  ?Problem: Education: ?Goal: Understanding of medication regimen will improve ?Outcome: Progressing ?  ?

## 2018-12-29 NOTE — Care Management Obs Status (Signed)
[facility] NOTIFICATION   Patient Details  Name: Alexandria Mcdowell MRN: [id number] Date of Birth: Dec 17, 1940   Medicare Observation Status Notification Given:  Yes    Candie Chroman, LCSW 12/29/2018, 4:21 PM

## 2018-12-29 NOTE — Progress Notes (Signed)
Patient received back post procedure report received from nurse. Patient is awake and appropriate arrived via wheelchair transferred from wheelchair to bed without diffulculty. Patient awake and appropriate as before. VS stable no distress.

## 2018-12-29 NOTE — Anesthesia Preprocedure Evaluation (Signed)
Anesthesia Evaluation  Patient identified by MRN, date of birth, ID band Patient awake    Reviewed: Allergy & Precautions, NPO status , Patient's Chart, lab work & pertinent test results  Airway Mallampati: II  TM Distance: >3 FB     Dental   Pulmonary neg pulmonary ROS,    breath sounds clear to auscultation       Cardiovascular hypertension, + dysrhythmias Atrial Fibrillation  Rhythm:Regular Rate:Normal     Neuro/Psych  Headaches,    GI/Hepatic negative GI ROS, Neg liver ROS,   Endo/Other  negative endocrine ROS  Renal/GU negative Renal ROS     Musculoskeletal  (+) Arthritis ,   Abdominal   Peds  Hematology  (+) Blood dyscrasia, ,   Anesthesia Other Findings   Reproductive/Obstetrics                             Lab Results  Component Value Date   WBC 6.6 12/29/2018   HGB 13.7 12/29/2018   HCT 39.4 12/29/2018   MCV 88.7 12/29/2018   PLT 235 12/29/2018   Lab Results  Component Value Date   CREATININE 0.93 12/29/2018   BUN 15 12/29/2018   NA 140 12/29/2018   K 3.5 12/29/2018   CL 104 12/29/2018   CO2 25 12/29/2018    Anesthesia Physical Anesthesia Plan  ASA: II  Anesthesia Plan: General   Post-op Pain Management:    Induction: Intravenous  PONV Risk Score and Plan: 3 and Treatment may vary due to age or medical condition  Airway Management Planned: Mask and Natural Airway  Additional Equipment:   Intra-op Plan:   Post-operative Plan:   Informed Consent: I have reviewed the patients History and Physical, chart, labs and discussed the procedure including the risks, benefits and alternatives for the proposed anesthesia with the patient or authorized representative who has indicated his/her understanding and acceptance.     Dental advisory given  Plan Discussed with: CRNA  Anesthesia Plan Comments:         Anesthesia Quick Evaluation

## 2018-12-29 NOTE — Progress Notes (Signed)
The patient is reexamined after undergoing cardioversion.  She is in sinus rhythm with a heart rate of 60 bpm.  She has no new symptoms and feels well, eager to be discharged.  We will plan for discharge home this evening.  I am going to have her stop atenolol and start Cardizem CD 240 mg daily.  She will remain on apixaban without interruption.  Will again arrange follow-up in the atrial fibrillation clinic.  I spoke with her husband on the telephone and we reviewed her plan for discharge.  Sherren Mocha 12/29/2018 4:53 PM

## 2018-12-29 NOTE — Progress Notes (Signed)
Progress Note  Patient Name: Alexandria Mcdowell Date of Encounter: 12/29/2018  Primary Cardiologist: Jenkins Rouge, MD  Primary electrophysiologist: Afib clinic   Subjective   No chest pain or shortness of breath.  No palpitations.  Feeling okay this morning.  Inpatient Medications    Scheduled Meds: . apixaban  5 mg Oral BID  . simvastatin  20 mg Oral q1800  . sodium chloride flush  3 mL Intravenous Q12H   Continuous Infusions: . sodium chloride Stopped (12/28/18 1719)  . diltiazem (CARDIZEM) infusion 12.5 mg/hr (12/29/18 0725)   PRN Meds: acetaminophen, ondansetron (ZOFRAN) IV, sodium chloride flush   Vital Signs    Vitals:   12/28/18 1743 12/28/18 2007 12/29/18 0003 12/29/18 0425  BP: 109/77 118/72 108/77 108/65  Pulse: (!) 118 73 (!) 54 63  Resp:  16 (!) 1 17  Temp:  97.9 F (36.6 C) 97.8 F (36.6 C) 98.3 F (36.8 C)  TempSrc:  Oral Oral Oral  SpO2: 99% 99% 96% 98%  Weight:    54.9 kg  Height:        Intake/Output Summary (Last 24 hours) at 12/29/2018 0807 Last data filed at 12/29/2018 0429 Gross per 24 hour  Intake 873.54 ml  Output 950 ml  Net -76.46 ml   Last 3 Weights 12/29/2018 12/28/2018 12/28/2018  Weight (lbs) 121 lb 122 lb 1.6 oz 121 lb 14.6 oz  Weight (kg) 54.885 kg 55.384 kg 55.3 kg      Telemetry    Afib in the 70s- Personally Reviewed  ECG    No new tracings - Personally Reviewed  Physical Exam  Alert, oriented woman, no distress GEN: No acute distress.   Neck: No JVD Cardiac: irregular rhythm, regular rate, 2/6 systolic ejection murmur best heard at the right upper sternal border Respiratory: Clear to auscultation bilaterally. GI: Soft, nontender, non-distended  MS: No edema; No deformity. Neuro:  Nonfocal  Psych: Normal affect   Labs    Chemistry Recent Labs  Lab 12/22/18 1127 12/28/18 1243 12/29/18 0603  NA 139 139 140  K 4.0 3.8 3.5  CL 103 102 104  CO2 22 26 25   GLUCOSE 115* 123* 117*  BUN 17 14 15   CREATININE  1.01* 0.89 0.93  CALCIUM 10.4* 10.1 9.4  GFRNONAA 53* >60 59*  GFRAA >60 >60 >60  ANIONGAP 14 11 11      Hematology Recent Labs  Lab 12/22/18 1127 12/28/18 1243 12/29/18 0603  WBC 8.6 7.4 6.6  RBC 4.76 4.51 4.44  HGB 14.6 14.1 13.7  HCT 42.5 40.8 39.4  MCV 89.3 90.5 88.7  MCH 30.7 31.3 30.9  MCHC 34.4 34.6 34.8  RDW 12.4 12.6 12.6  PLT 245 240 235    Cardiac EnzymesNo results for input(s): TROPONINI in the last 168 hours. No results for input(s): TROPIPOC in the last 168 hours.   BNPNo results for input(s): BNP, PROBNP in the last 168 hours.   DDimer No results for input(s): DDIMER in the last 168 hours.   Radiology    No results found.  Cardiac Studies   NA  Patient Profile     78 y.o. female with a PMH of HTN, HLD, and recently diagnosed atrial fibrillation on eliquis, who presented to the ED and was found to be in atrial fibrillation with RVR.  Assessment & Plan    1. Atrial fibrillation  - previous successful DCCV 12/22/18, rate controlled at home with atenolol - anticoagulated with eliquis - This patients CHA2DS2-VASc Score and  unadjusted Ischemic Stroke Rate (% per year) is equal to 4.8 % stroke rate/year from a score of 4 (female, age, HTN) - cardizem drip running at 12.5 mg/hr - TSH WNL - Mg pending, K 3.5 - she remains in Afib this morning with rates in the 70s - BP in the 100s - rates dropped into the 40s overnight while she was sleeping, pt asymptomatic - will proceed with planned DCCV today   2. Hypertension - home medications: atenolol, lisinopril-HCTZ - pressures have been controlled with dilt drip  3. Hyperlipidemia - continue statin - 06/23/2018: Cholesterol 157; HDL 47.70; Triglycerides 231.0; VLDL 46.2  For questions or updates, please contact [place] Please consult [email or web address] for contact info under     Signed, Ledora Bottcher, PA  12/29/2018, 8:07 AM    Patient seen, examined. Available data reviewed. Agree with  findings, assessment, and plan as outlined by Doreene Adas, PA-C.  The physical exam findings above reflect my personal findings of this patient's examination this morning.  I have reviewed her telemetry and she now has rate controlled atrial fibrillation on diltiazem 12.5 mg/h.  We plan to proceed with cardioversion scheduled for 2:45 PM this afternoon.  We will give the patient clear liquids this morning and then she will be n.p.o. for the procedure.  I have reviewed the risks, indications, and alternatives with the patient.  She has been compliant with apixaban and has not missed any doses.  Plan to transition her from IV to oral diltiazem after cardioversion.  Possibly discharge home this afternoon following cardioversion if she remains stable.  I will plan to hold her other antihypertensive medications with addition of diltiazem.  Sherren Mocha, M.D. 12/29/2018 8:23 AM

## 2018-12-29 NOTE — Progress Notes (Signed)
Call placed to [facility] to notify of telemetry monitoring d/c.   

## 2018-12-29 NOTE — Telephone Encounter (Signed)
Agree.. have pt push back hospital follow up to next week.. try to again convince to do doxy.me.Thanks!

## 2018-12-29 NOTE — Progress Notes (Signed)
Pt refuses zocor and SCDs states she intends to leave today, and its not necessary.   MD arrives on floor stating he is in fact going to discharge her.   Awaiting DC order to complete AVS/DC paperwork.

## 2018-12-29 NOTE — Anesthesia Procedure Notes (Signed)
Procedure Name: General with mask airway Date/Time: 12/29/2018 3:09 PM Performed by: Leonor Liv, CRNA Oxygen Delivery Method: Ambu bag Preoxygenation: Pre-oxygenation with 100% oxygen Induction Type: IV induction Dental Injury: Teeth and Oropharynx as per pre-operative assessment

## 2018-12-30 ENCOUNTER — Ambulatory Visit: Payer: Medicare HMO | Admitting: Family Medicine

## 2018-12-30 ENCOUNTER — Encounter (HOSPITAL_COMMUNITY): Payer: Self-pay | Admitting: Cardiology

## 2018-12-30 NOTE — Anesthesia Postprocedure Evaluation (Signed)
Anesthesia Post Note  Patient: PALMA BUSTER  Procedure(s) Performed: CARDIOVERSION (N/A )     Patient location during evaluation: PACU Anesthesia Type: General Level of consciousness: awake and alert Pain management: pain level controlled Vital Signs Assessment: post-procedure vital signs reviewed and stable Respiratory status: spontaneous breathing, nonlabored ventilation, respiratory function stable and patient connected to nasal cannula oxygen Cardiovascular status: blood pressure returned to baseline and stable Postop Assessment: no apparent nausea or vomiting Anesthetic complications: no    Last Vitals:  Vitals:   12/29/18 1711 12/29/18 1716  BP: 137/83 137/83  Pulse:  68  Resp:    Temp:    SpO2:      Last Pain:  Vitals:   12/29/18 1530  TempSrc: Oral  PainSc:                  Stepehn Eckard COKER

## 2019-01-04 ENCOUNTER — Ambulatory Visit (INDEPENDENT_AMBULATORY_CARE_PROVIDER_SITE_OTHER): Payer: Medicare HMO | Admitting: Family Medicine

## 2019-01-04 ENCOUNTER — Emergency Department (HOSPITAL_COMMUNITY)
Admission: EM | Admit: 2019-01-04 | Discharge: 2019-01-04 | Disposition: A | Discharge status: Home / Self Care | Payer: Medicare HMO | Attending: Emergency Medicine | Admitting: Emergency Medicine

## 2019-01-04 ENCOUNTER — Emergency Department (HOSPITAL_COMMUNITY): Payer: Medicare HMO

## 2019-01-04 ENCOUNTER — Encounter (HOSPITAL_COMMUNITY): Payer: Self-pay

## 2019-01-04 ENCOUNTER — Encounter: Payer: Self-pay | Admitting: Family Medicine

## 2019-01-04 VITALS — BP 153/92 | HR 85 | Temp 98.4°F | Ht 61.5 in | Wt 121.1 lb

## 2019-01-04 DIAGNOSIS — I10 Essential (primary) hypertension: Secondary | ICD-10-CM

## 2019-01-04 DIAGNOSIS — R002 Palpitations: Secondary | ICD-10-CM | POA: Diagnosis not present

## 2019-01-04 DIAGNOSIS — Z7901 Long term (current) use of anticoagulants: Secondary | ICD-10-CM | POA: Diagnosis not present

## 2019-01-04 DIAGNOSIS — Z79899 Other long term (current) drug therapy: Secondary | ICD-10-CM | POA: Diagnosis not present

## 2019-01-04 DIAGNOSIS — I4891 Unspecified atrial fibrillation: Secondary | ICD-10-CM | POA: Insufficient documentation

## 2019-01-04 LAB — CBC WITH DIFFERENTIAL/PLATELET
Abs Immature Granulocytes: 0.02 10*3/uL (ref 0.00–0.07)
Basophils Absolute: 0.1 10*3/uL (ref 0.0–0.1)
Basophils Relative: 1 %
Eosinophils Absolute: 0.1 10*3/uL (ref 0.0–0.5)
Eosinophils Relative: 2 %
HCT: 46 % (ref 36.0–46.0)
Hemoglobin: 15.4 g/dL — ABNORMAL HIGH (ref 12.0–15.0)
Immature Granulocytes: 0 %
Lymphocytes Relative: 26 %
Lymphs Abs: 1.6 10*3/uL (ref 0.7–4.0)
MCH: 30.1 pg (ref 26.0–34.0)
MCHC: 33.5 g/dL (ref 30.0–36.0)
MCV: 89.8 fL (ref 80.0–100.0)
Monocytes Absolute: 0.6 10*3/uL (ref 0.1–1.0)
Monocytes Relative: 9 %
Neutro Abs: 3.8 10*3/uL (ref 1.7–7.7)
Neutrophils Relative %: 62 %
Platelets: 252 10*3/uL (ref 150–400)
RBC: 5.12 MIL/uL — ABNORMAL HIGH (ref 3.87–5.11)
RDW: 12.7 % (ref 11.5–15.5)
WBC: 6.1 10*3/uL (ref 4.0–10.5)
nRBC: 0 % (ref 0.0–0.2)

## 2019-01-04 LAB — BASIC METABOLIC PANEL
Anion gap: 14 (ref 5–15)
BUN: 11 mg/dL (ref 8–23)
CO2: 25 mmol/L (ref 22–32)
Calcium: 10 mg/dL (ref 8.9–10.3)
Chloride: 99 mmol/L (ref 98–111)
Creatinine, Ser: 0.88 mg/dL (ref 0.44–1.00)
GFR calc Af Amer: >60 mL/min (ref >60)
GFR calc non Af Amer: >60 mL/min (ref >60)
Glucose, Bld: 136 mg/dL — ABNORMAL HIGH (ref 70–99)
Potassium: 3.4 mmol/L — ABNORMAL LOW (ref 3.5–5.1)
Sodium: 138 mmol/L (ref 135–145)

## 2019-01-04 LAB — TROPONIN I: Troponin I: <0.03 ng/mL (ref <0.03)

## 2019-01-04 LAB — MAGNESIUM: Magnesium: 1.8 mg/dL (ref 1.7–2.4)

## 2019-01-04 MED ORDER — PROPOFOL 10 MG/ML IV BOLUS
1.0000 mg/kg | Freq: Once | INTRAVENOUS | Status: AC
  Administered 2019-01-04: 54.9 mg via INTRAVENOUS
  Filled 2019-01-04: qty 20

## 2019-01-04 MED ORDER — SODIUM CHLORIDE 0.9 % IV SOLN
INTRAVENOUS | Status: DC
  Administered 2019-01-04: 06:00:00 via INTRAVENOUS

## 2019-01-04 MED ORDER — DILTIAZEM HCL-DEXTROSE 100-5 MG/100ML-% IV SOLN (PREMIX): 5.0000 mg/h | INTRAVENOUS | Status: DC

## 2019-01-04 MED ORDER — MAGNESIUM OXIDE 400 (241.3 MG) MG PO TABS
800.0000 mg | ORAL_TABLET | Freq: Once | ORAL | Status: AC
  Administered 2019-01-04: 800 mg via ORAL
  Filled 2019-01-04: qty 2

## 2019-01-04 MED ORDER — PROPOFOL 10 MG/ML IV BOLUS
INTRAVENOUS | Status: AC | PRN
  Administered 2019-01-04: 50 mg via INTRAVENOUS

## 2019-01-04 MED ORDER — POTASSIUM CHLORIDE CRYS ER 20 MEQ PO TBCR
40.0000 meq | EXTENDED_RELEASE_TABLET | Freq: Once | ORAL | Status: AC
  Administered 2019-01-04: 40 meq via ORAL
  Filled 2019-01-04: qty 2

## 2019-01-04 NOTE — Sedation Documentation (Signed)
Cardioversion at 150

## 2019-01-04 NOTE — Assessment & Plan Note (Signed)
S/P several cardioversions. Now at D/C this AM from ER.. in sinus on cardizem 240 mg daily. On eliquis for anticoagulation  HASBLED score 2.

## 2019-01-04 NOTE — ED Triage Notes (Signed)
Pt comes with palpations that woke her up from her sleep, afib RVR, hx of the same with cardioversion last week. Rate 160's, denies CP and SOB

## 2019-01-04 NOTE — Assessment & Plan Note (Signed)
BP  Borderline elevated.. was better controlled in ER this AM as well as at home in last week..  Pt with increased stress level today.. continue to follow.. has appt with afib clinic tommrow.. may want to increase cardizem or add additional med to treat BP.

## 2019-01-04 NOTE — Discharge Instructions (Addendum)
Please continue your diltiazem and Eliquis as prescribed.  Please call your cardiologist for close follow-up and further recommendations.

## 2019-01-04 NOTE — Sedation Documentation (Signed)
Cardioversion at 200

## 2019-01-04 NOTE — Sedation Documentation (Signed)
Cardioversion at 120

## 2019-01-04 NOTE — Progress Notes (Signed)
VIRTUAL VISIT Due to national recommendations of social distancing due to [place] 19, a virtual visit is felt to be most appropriate for this patient at this time.   I connected with the patient on 01/04/19 at 11:40 AM EDT by virtual telehealth platform and verified that I am speaking with the correct person using two identifiers.   Interactive audio and video telecommunications were attempted between this provider and patient, however failed, due to patient having technical difficulties OR patient did not have access to video capability.  We continued and completed visit with audio only.    I discussed the limitations, risks, security and privacy concerns of performing an evaluation and management service by  virtual telehealth platform and the availability of in person appointments. I also discussed with the patient that there may be a patient responsible charge related to this service. The patient expressed understanding and agreed to proceed.  Patient location: Home Provider Location: [facility] Participants: Eliezer Lofts and [name]   Chief Complaint  Patient presents with  . Follow-up    ER Visit-A Fib-Went to ER again this morning    History of Present Illness: 78 year old female presents for hospital follow up for new onset afib.  Dx with Afib with RVR on 12/22/18 with ER visit  Given cardizem and cardioverted successfully to sinus CBC: No anemia/leukocytosis.  BMP: Mild hyperglycemia/hypercalcemia similar to prior. Creatinine minimally increased from prior.  CXR: Negative. No infiltrate/edema/effusion/pneumothorax.   CHA2DS2/VAS score of 4- started on eliquis  Started atenolol.  Set up in Afib clinic.. saw Malka So PA on 4/13 virtual OV  Continued atenolol.   Return for hospitalization on 4/14 with recurrent afib RVR, Rate 135, diffuse ST changes on EKG. She was admitted to cardiology and started on a cardizem drip. DCCV was planned and completed on  12/29/18 with successful conversion to NSR. She tolerated the procedure well. She is now in NSR in the 60s. Will change atenolol to 240 mg BID cardizem.   Returned to ER this AM for  RVR.  Patient's labs unremarkable. Neg troponin  Potassium is 3.4 and magnesium 1.8.  Will give oral replacement to optimize her electrolytes.  Troponin negative.  Chest x-ray clear.  Patient was cardioverted in the emergency department and now in a sinus rhythm.  It took 3 attempts of cardioversion to get her in a sinus rhythm.    Now she reports BP at home 153/92 HR 85  She is feeling better. No CP, no SOB, no edema.  No lightheadedness.   Next follow up tommorow with AFib clinic. Going to call to set up follow up with cardiologist.  primary cardiologist: Dr. Johnsie Cancel  COVID 19 screen No recent travel or known exposure to [place] The patient denies respiratory symptoms of COVID 19 at this time.  The importance of social distancing was discussed today.   Review of Systems  Constitutional: Negative for chills and fever.  HENT: Negative for congestion and ear pain.   Eyes: Negative for pain and redness.  Respiratory: Negative for cough and shortness of breath.   Cardiovascular: Negative for chest pain, palpitations and leg swelling.  Gastrointestinal: Negative for abdominal pain, blood in stool, constipation, diarrhea, nausea and vomiting.  Genitourinary: Negative for dysuria.  Musculoskeletal: Negative for falls and myalgias.  Skin: Negative for rash.  Neurological: Negative for dizziness.  Psychiatric/Behavioral: Negative for depression. The patient is not nervous/anxious.       Past Medical History:  Diagnosis Date  .  Allergy   . Anemia   . Hyperlipidemia   . Hypertension   . Migraine     reports that she has never smoked. She has never used smokeless tobacco. She reports that she does not drink alcohol or use drugs.   Current Outpatient Medications:  .  alendronate (FOSAMAX) 35 MG tablet, Take  35 mg by mouth every 7 (seven) days. Take with a full glass of water on an empty stomach., Disp: , Rfl:  .  apixaban (ELIQUIS) 5 MG TABS tablet, Take 1 tablet (5 mg total) by mouth 2 (two) times daily for 30 days., Disp: 60 tablet, Rfl: 0 .  diltiazem (CARDIZEM CD) 240 MG 24 hr capsule, Take 1 capsule (240 mg total) by mouth daily., Disp: 90 capsule, Rfl: 3 .  lisinopril-hydrochlorothiazide (PRINZIDE,ZESTORETIC) 10-12.5 MG tablet, Take 1 tablet by mouth daily., Disp: 90 tablet, Rfl: 3 .  Multiple Vitamins-Minerals (MULTIVITAMIN WITH MINERALS) tablet, Take 1 tablet by mouth daily., Disp: , Rfl:  .  Omega-3 Fatty Acids (FISH OIL) 1200 MG CAPS, Take 1 capsule by mouth daily., Disp: , Rfl:  .  simvastatin (ZOCOR) 20 MG tablet, Take 1 tablet (20 mg total) by mouth daily., Disp: 90 tablet, Rfl: 3 .  sodium chloride (OCEAN) 0.65 % SOLN nasal spray, Place 1 spray into both nostrils as needed for congestion., Disp: , Rfl:    Observations/Objective: Blood pressure (!) 153/92, pulse 85, temperature 98.4 F (36.9 C), temperature source Oral, height 5' 1.5" (1.562 m), weight 121 lb 2 oz (54.9 kg).  Physical Exam  Physical Exam Constitutional:      General: She is not in acute distress. Pulmonary:     Effort: Pulmonary effort is normal. No respiratory distress.  Neurological:     Mental Status: She is alert and oriented to person, place, and time.  Psychiatric:        Mood and Affect: Mood normal.        Behavior: Behavior normal.   Assessment and Plan Atrial fibrillation with RVR (HCC) S/P several cardioversions. Now at D/C this AM from ER.. in sinus on cardizem 240 mg daily. On eliquis for anticoagulation  HASBLED score 2.  Essential hypertension  BP  Borderline elevated.. was better controlled in ER this AM as well as at home in last week..  Pt with increased stress level today.. continue to follow.. has appt with afib clinic tommrow.. may want to increase cardizem or add additional med to  treat BP.     I discussed the assessment and treatment plan with the patient. The patient was provided an opportunity to ask questions and all were answered. The patient agreed with the plan and demonstrated an understanding of the instructions.   The patient was advised to call back or seek an in-person evaluation if the symptoms worsen or if the condition fails to improve as anticipated.     Eliezer Lofts, MD

## 2019-01-04 NOTE — ED Notes (Signed)
Patient verbalizes understanding of discharge instructions. Opportunity for questioning and answers were provided. Armband removed by staff, pt discharged from ED.  

## 2019-01-04 NOTE — ED Provider Notes (Addendum)
TIME SEEN: 5:39 AM  CHIEF COMPLAINT: Palpitations  HPI: Patient is a 78 year old female with history of hypertension, hyperlipidemia, atrial fibrillation with recurrent cardioversion on diltiazem and Eliquis who presents emergency department with palpitations that woke her from sleep at 3 AM.  No chest pain, shortness of breath, nausea, vomiting, diarrhea, fevers, cough.  Reports compliance with diltiazem and Eliquis.  Was seen in the emergency department on April 8 for the same and was initially diagnosed with atrial fibrillation.  Was started on atenolol and Eliquis and cardioverted in the ED.  Came back to the hospital on April 14 and was admitted to the hospital on diltiazem drip and cardioverted while in the hospital.  Discharged on diltiazem.  ROS: See HPI Constitutional: no fever  Eyes: no drainage  ENT: no runny nose   Cardiovascular:  no chest pain  Resp: no SOB  GI: no vomiting GU: no dysuria Integumentary: no rash  Allergy: no hives  Musculoskeletal: no leg swelling  Neurological: no slurred speech ROS otherwise negative  PAST MEDICAL HISTORY/PAST SURGICAL HISTORY:  Past Medical History:  Diagnosis Date  . Allergy   . Anemia   . Hyperlipidemia   . Hypertension   . Migraine     MEDICATIONS:  Prior to Admission medications   Medication Sig Start Date End Date Taking? Authorizing Provider  alendronate (FOSAMAX) 35 MG tablet Take 35 mg by mouth every 7 (seven) days. Take with a full glass of water on an empty stomach.    [provider]  apixaban (ELIQUIS) 5 MG TABS tablet Take 1 tablet (5 mg total) by mouth 2 (two) times daily for 30 days. 12/22/18 01/21/19  Petrucelli, Samantha R, PA-C  diltiazem (CARDIZEM CD) 240 MG 24 hr capsule Take 1 capsule (240 mg total) by mouth daily. 12/30/18   [facility], Tami Lin, PA  lisinopril-hydrochlorothiazide (PRINZIDE,ZESTORETIC) 10-12.5 MG tablet Take 1 tablet by mouth daily. 07/02/18   Bedsole, Amy E, MD  Multiple Vitamins-Minerals  (MULTIVITAMIN WITH MINERALS) tablet Take 1 tablet by mouth daily.    [provider]  Omega-3 Fatty Acids (FISH OIL) 1200 MG CAPS Take 1 capsule by mouth daily.    [provider]  simvastatin (ZOCOR) 20 MG tablet Take 1 tablet (20 mg total) by mouth daily. 07/02/18   Bedsole, Amy E, MD  sodium chloride (OCEAN) 0.65 % SOLN nasal spray Place 1 spray into both nostrils as needed for congestion.    [provider]    ALLERGIES:  Allergies  Allergen Reactions  . Clindamycin Hcl Nausea And Vomiting    nervous  . Codeine Nausea And Vomiting  . Erythromycin Nausea And Vomiting  . Tetracycline Nausea And Vomiting  . Penicillins Rash    Did it involve swelling of the face/tongue/throat, SOB, or low BP? No Did it involve sudden or severe rash/hives, skin peeling, or any reaction on the inside of your mouth or nose? Yes Did you need to seek medical attention at a hospital or doctor's office? Yes When did it last happen?childhood If all above answers are "NO", may proceed with cephalosporin use.   . Sulfonamide Derivatives Nausea And Vomiting and Rash    SOCIAL HISTORY:  Social History   Tobacco Use  . Smoking status: Never Smoker  . Smokeless tobacco: Never Used  Substance Use Topics  . Alcohol use: No    FAMILY HISTORY: Family History  Problem Relation Age of Onset  . Hypertension Mother   . Hyperlipidemia Mother   . Cancer Mother  breast/colon  . Heart disease Mother   . Diabetes Father   . Emphysema Father   . Hyperlipidemia Sister   . Hypertension Sister   . Hypothyroidism Sister     EXAM: BP (!) 150/81   Pulse (!) 153   Temp 98.4 F (36.9 C) (Oral)   Resp 15   SpO2 98%  CONSTITUTIONAL: Alert and oriented and responds appropriately to questions. Well-appearing; well-nourished, elderly, afebrile, no distress, nontoxic HEAD: Normocephalic EYES: Conjunctivae clear, pupils appear equal, EOMI ENT: normal nose; moist mucous  membranes NECK: Supple, no meningismus, no nuchal rigidity, no LAD  CARD: Irregularly irregular and tachycardic; S1 and S2 appreciated; no murmurs, no clicks, no rubs, no gallops RESP: Normal chest excursion without splinting or tachypnea; breath sounds clear and equal bilaterally; no wheezes, no rhonchi, no rales, no hypoxia or respiratory distress, speaking full sentences ABD/GI: Normal bowel sounds; non-distended; soft, non-tender, no rebound, no guarding, no peritoneal signs, no hepatosplenomegaly BACK:  The back appears normal and is non-tender to palpation, there is no CVA tenderness EXT: Normal ROM in all joints; non-tender to palpation; no edema; normal capillary refill; no cyanosis, no calf tenderness or swelling    SKIN: Normal color for age and race; warm; no rash NEURO: Moves all extremities equally PSYCH: The patient's mood and manner are appropriate. Grooming and personal hygiene are appropriate.  MEDICAL DECISION MAKING: Patient here with recurrent A. fib with RVR.  I have talked to the cardiology fellow Dr. Taunton Lions.  He thinks it would be reasonable to repeat cardioversion in the emergency department and patient agrees to this.  She has been compliant with her anticoagulation.  I asked if we should alter any of her medications and cardiology fellow states that we should leave them alone at this time and she can follow-up with cardiology as an outpatient.  Patient is comfortable with this plan.  ED PROGRESS: 7:00 AM  Patient's labs unremarkable.  Potassium is 3.4 and magnesium 1.8.  Will give oral replacement to optimize her electrolytes.  Troponin negative.  Chest x-ray clear.  Patient was cardioverted in the emergency department and now in a sinus rhythm.  It took 3 attempts of cardioversion to get her in a sinus rhythm.  She is awake and has no complaints.  Will monitor patient in the ED but anticipate discharge home with her husband.   I reviewed all nursing notes, vitals,  pertinent previous records, EKGs, lab and urine results, imaging (as available).     EKG Interpretation  Date/Time:  Tuesday January 04 2019 05:29:20 EDT Ventricular Rate:  157 PR Interval:    QRS Duration: 83 QT Interval:  290 QTC Calculation: 469 R Axis:   32 Text Interpretation:  Atrial fibrillation with rapid V-rate Repolarization abnormality, prob rate related Atrial fibrillation has replaced sinus rhythm Confirmed by Pryor Curia [phone]) on 01/04/2019 5:40:19 AM        EKG Interpretation  Date/Time:  Tuesday January 04 2019 06:44:16 EDT Ventricular Rate:  96 PR Interval:    QRS Duration: 78 QT Interval:  348 QTC Calculation: 440 R Axis:   44 Text Interpretation:  Sinus rhythm Low voltage, precordial leads Minimal ST depression, lateral leads A fib has resolved Confirmed by Pryor Curia [id number]) on 01/04/2019 7:04:37 AM        .Critical Care Performed by: Trenesha Alcaide, Delice Bison, DO Authorized by: Eyla Tallon, Delice Bison, DO   Critical care provider statement:    Critical care time (minutes):  45   Critical care  was necessary to treat or prevent imminent or life-threatening deterioration of the following conditions:  Cardiac failure   Critical care was time spent personally by me on the following activities:  Discussions with consultants, evaluation of patient's response to treatment, examination of patient, ordering and performing treatments and interventions, ordering and review of laboratory studies, ordering and review of radiographic studies, pulse oximetry, re-evaluation of patient's condition, obtaining history from patient or surrogate, review of old charts and development of treatment plan with patient or surrogate .Sedation Date/Time: 01/04/2019 6:45 AM Performed by: Brodin Gelpi, Delice Bison, DO Authorized by: Janele Lague, Delice Bison, DO   Consent:    Consent obtained:  Written   Consent given by:  Patient   Risks discussed:  Allergic reaction, dysrhythmia, inadequate sedation, nausea,  prolonged hypoxia resulting in organ damage, prolonged sedation necessitating reversal, respiratory compromise necessitating ventilatory assistance and intubation and vomiting   Alternatives discussed:  Analgesia without sedation, anxiolysis and regional anesthesia Universal protocol:    Procedure explained and questions answered to patient or proxy's satisfaction: yes     Relevant documents present and verified: yes     Test results available and properly labeled: yes     Imaging studies available: yes     Required blood products, implants, devices, and special equipment available: yes     Site/side marked: yes     Immediately prior to procedure a time out was called: yes     Patient identity confirmation method:  Verbally with patient Indications:    Procedure performed:  Cardioversion   Procedure necessitating sedation performed by:  Physician performing sedation Pre-sedation assessment:    Time since last food or drink:  5pm   ASA classification: class 2 - patient with mild systemic disease     Neck mobility: normal     Mouth opening:  3 or more finger widths   Thyromental distance:  4 finger widths   Mallampati score:  II - soft palate, uvula, fauces visible   Pre-sedation assessments completed and reviewed: airway patency, cardiovascular function, hydration status, mental status, nausea/vomiting, pain level, respiratory function and temperature     Pre-sedation assessment completed:  01/04/2019 6:30 AM Immediate pre-procedure details:    Reassessment: Patient reassessed immediately prior to procedure     Reviewed: vital signs, relevant labs/tests and NPO status     Verified: bag valve mask available, emergency equipment available, intubation equipment available, IV patency confirmed, oxygen available and suction available   Procedure details (see MAR for exact dosages):    Preoxygenation:  Nasal cannula   Sedation:  Propofol   Intra-procedure monitoring:  Blood pressure monitoring,  cardiac monitor, continuous pulse oximetry, frequent LOC assessments, frequent vital sign checks and continuous capnometry   Intra-procedure events: respiratory depression     Intra-procedure management:  Airway repositioning and BVM ventilation   Total Provider sedation time (minutes):  10 Post-procedure details:    Post-sedation assessment completed:  01/04/2019 6:52 AM   Attendance: Constant attendance by certified staff until patient recovered     Recovery: Patient returned to pre-procedure baseline     Post-sedation assessments completed and reviewed: airway patency, cardiovascular function, hydration status, mental status, nausea/vomiting, pain level, respiratory function and temperature     Patient is stable for discharge or admission: yes     Patient tolerance:  Tolerated well, no immediate complications .Cardioversion Date/Time: 01/04/2019 6:55 AM Performed by: Ayerim Berquist, Delice Bison, DO Authorized by: Alecea Trego, Delice Bison, DO   Consent:    Consent obtained:  Written  Consent given by:  Patient   Alternatives discussed:  Rate-control medication Pre-procedure details:    Cardioversion basis:  Elective   Rhythm:  Atrial fibrillation   Electrode placement:  Anterior-posterior Patient sedated: Yes. Refer to sedation procedure documentation for details of sedation.  Attempt one:    Cardioversion mode:  Synchronous   Waveform:  Biphasic   Shock (Joules):  120   Shock outcome:  Conversion to normal sinus rhythm (change briefly to NSR then back to a fib with RVR) Attempt two:    Cardioversion mode:  Synchronous   Waveform:  Biphasic   Shock (Joules):  150   Shock outcome:  No change in rhythm Attempt three:    Cardioversion mode:  Synchronous   Waveform:  Biphasic   Shock (Joules):  200   Shock outcome:  Conversion to normal sinus rhythm Post-procedure details:    Patient status:  Awake   Patient tolerance of procedure:  Tolerated well, no immediate complications      Bayani Renteria,  Delice Bison, DO 01/04/19 0705    CHADS Vasc 2 = 4.  Patient on Eliquis.   Demeshia Sherburne, Delice Bison, DO 01/17/19 [phone]

## 2019-01-04 NOTE — ED Provider Notes (Signed)
Patient is feeling well. She is awake, alert, appropriate. She has tolerated oral fluids/meds. She feels ready for discharge. Has remained in sinus rhythm. She was instructed to call her cardiologist for close follow up.   Sherwood Gambler, MD 01/04/19 [phone]

## 2019-01-05 ENCOUNTER — Ambulatory Visit (HOSPITAL_COMMUNITY)
Admission: RE | Admit: 2019-01-05 | Discharge: 2019-01-05 | Disposition: A | Discharge status: Home / Self Care | Payer: Medicare HMO | Source: Ambulatory Visit | Attending: Physician Assistant | Admitting: Physician Assistant

## 2019-01-05 ENCOUNTER — Encounter (HOSPITAL_COMMUNITY): Payer: Self-pay | Admitting: Physician Assistant

## 2019-01-05 ENCOUNTER — Telehealth: Payer: Medicare HMO | Admitting: Cardiology

## 2019-01-05 ENCOUNTER — Telehealth: Payer: Self-pay | Admitting: Cardiovascular Disease

## 2019-01-05 ENCOUNTER — Other Ambulatory Visit: Payer: Self-pay

## 2019-01-05 VITALS — BP 138/78 | HR 80 | Ht 61.5 in | Wt 121.0 lb

## 2019-01-05 DIAGNOSIS — I480 Paroxysmal atrial fibrillation: Secondary | ICD-10-CM | POA: Diagnosis not present

## 2019-01-05 MED ORDER — DILTIAZEM HCL 30 MG PO TABS: ORAL_TABLET | ORAL | 1 refills | Status: DC

## 2019-01-05 MED ORDER — ATORVASTATIN CALCIUM 10 MG PO TABS: 10.0000 mg | ORAL_TABLET | Freq: Every day | ORAL | 1 refills | Status: DC

## 2019-01-05 NOTE — Progress Notes (Deleted)
Virtual Visit via Video Note   This visit type was conducted due to national recommendations for restrictions regarding the COVID-19 Pandemic (e.g. social distancing) in an effort to limit this patient's exposure and mitigate transmission in our community.  Due to her co-morbid illnesses, this patient is at least at moderate risk for complications without adequate follow up.  This format is felt to be most appropriate for this patient at this time.  All issues noted in this document were discussed and addressed.  A limited physical exam was performed with this format.  Please refer to the patient's chart for her consent to telehealth for [facility].   Evaluation Performed:  Follow-up visit  Date:  01/05/2019   ID:  Alexandria Mcdowell, DOB 08/19/1941, MRN [id number]  Patient Location: Home Provider Location: Office  PCP:  Jinny Sanders, MD  Cardiologist:  Jenkins Rouge, MD   Electrophysiologist:  None   Chief Complaint:  Palpitations / Afib  History of Present Illness:    Alexandria Mcdowell is a 78 y.o. female seen for afib. Referred by Eliezer Lofts primary Initially seen 12/22/18 in ER cardioverted and started on eliquis and Cardizem. CHADVASC 4 Had recurrent afib rate 135 converted again 12/29/18 01/04/19 required another cardioversion in ER She has not had echo due to [place] restrictions. She has no history of vascular/CAD. No chest pain Has had negative troponin's on presentation with her rapid afib   ***  The patient does not have symptoms concerning for COVID-19 infection (fever, chills, cough, or new shortness of breath).    Past Medical History:  Diagnosis Date  . Allergy   . Anemia   . Hyperlipidemia   . Hypertension   . Migraine    Past Surgical History:  Procedure Laterality Date  . APPENDECTOMY    . BREAST BIOPSY    . CARDIOVERSION N/A 12/29/2018   Procedure: CARDIOVERSION;  Surgeon: Dorothy Spark, MD;  Location: [facility] ENDOSCOPY;  Service: Cardiovascular;   Laterality: N/A;  . CATARACT EXTRACTION  05/05/13   right eye   . COLONOSCOPY  2006  . [place]  . WISDOM TOOTH EXTRACTION       No outpatient medications have been marked as taking for the 01/06/19 encounter (Appointment) with Josue Hector, MD.     Allergies:   Clindamycin hcl; Codeine; Erythromycin; Tetracycline; Penicillins; and Sulfonamide derivatives   Social History   Tobacco Use  . Smoking status: Never Smoker  . Smokeless tobacco: Never Used  Substance Use Topics  . Alcohol use: No  . Drug use: No     Family Hx: The patient's family history includes Cancer in her mother; Diabetes in her father; Emphysema in her father; Heart disease in her mother; Hyperlipidemia in her mother and sister; Hypertension in her mother and sister; Hypothyroidism in her sister.  ROS:   Please see the history of present illness.     All other systems reviewed and are negative.   Prior CV studies:   The following studies were reviewed today:  Echo 2015 Notes ER, labs, CXR  Labs/Other Tests and Data Reviewed:    EKG:  SR rate 96 normal low voltage 01/04/19  Recent Labs: 06/23/2018: ALT 28 12/28/2018: TSH 0.732 01/04/2019: BUN 11; Creatinine, Ser 0.88; Hemoglobin 15.4; Magnesium 1.8; Platelets 252; Potassium 3.4; Sodium 138   Recent Lipid Panel Lab Results  Component Value Date/Time   CHOL 157 06/23/2018 09:05 AM   TRIG 231.0 (H) 06/23/2018 09:05 AM  HDL 47.70 06/23/2018 09:05 AM   CHOLHDL 3 06/23/2018 09:05 AM   LDLCALC 79 06/17/2017 10:24 AM   LDLDIRECT 65.0 06/23/2018 09:05 AM    Wt Readings from Last 3 Encounters:  01/04/19 54.9 kg  12/29/18 54.9 kg  12/22/18 55.3 kg     Objective:    Vital Signs:  There were no vitals taken for this visit.   No distress Skin warm and dry No tachypnea No JVP elevation No edema   ASSESSMENT & PLAN:    1. PAF:  Needs AAT. No history of CAD. Needs echo to make sure EF normal and f/u stress testing Start  flecainide 50 bid will refer to Dr Rayann Heman or Camnitz in f/u to consider ablation vs escalating AAT in future  2. HTN:; Well controlled.  Continue current medications and low sodium Dash type diet.    3. HLD:  On stsatin labs with primary    COVID-19 Education: The signs and symptoms of COVID-19 were discussed with the patient and how to seek care for testing (follow up with PCP or arrange E-visit).  The importance of social distancing was discussed today.  Time:   Today, I have spent 30 minutes with the patient with telehealth technology discussing the above problems.     Medication Adjustments/Labs and Tests Ordered: Current medicines are reviewed at length with the patient today.  Concerns regarding medicines are outlined above.   Tests Ordered: No orders of the defined types were placed in this encounter.   Medication Changes: No orders of the defined types were placed in this encounter.   Disposition:  Follow up f/u with EP 4-6 weeks   Signed, Jenkins Rouge, MD  01/05/2019 7:58 AM    [place]

## 2019-01-05 NOTE — Telephone Encounter (Signed)
New message     Called pt to verify cell phone number for video visit.  Pt states that she does not know how to use her cell phone, cannot retrieve text messages.  Pt stated that she told the scheduler that it would have to be a phone visit.  However, pt does have mychart and requested consent be sent to her mychart and she will read it prior to appt.  Pt did give verbal consent to phone visit for tomorrow.  Consent sent to mychart.

## 2019-01-05 NOTE — Progress Notes (Signed)
Electrophysiology TeleHealth Note   Due to national recommendations of social distancing due to [place] 19, Audio telehealth visit is felt to be most appropriate for this patient at this time.  See consent below from today for patient consent regarding telehealth for the Atrial Fibrillation Clinic. Consent obtained verbally.   Date:  01/05/2019   ID:  [name], DOB 1941/07/02, MRN [id number]  Location: home  Provider location: [address] [place], [place] [address] Evaluation Performed: Follow up  PCP:  Jinny Sanders, MD  Primary Cardiologist:  Dr Johnsie Cancel   CC: Follow up for paroxsymal atrial fibrillation   History of Present Illness: Alexandria Mcdowell is a 78 y.o. female who presents via audio conferencing for a telehealth visit today. Patient reports that the evening of 12/21/18 she started to feel "off." The next morning, she felt her heart racing which she describes as a "speeding train" with associated fatigue and SOB. She went to her PCP office and was found to be in afib with RVR and she was sent to the ER for evaluation. She underwent successful DCCV at the ER and was discharged on Eliquis. She was already taking atenolol prior. She denies any precipitating factors. She denies snoring or significant alcohol use.   Since her last visit, she has been to the ER two additional times with AF with RVR and has been successfully cardioverted. She has not missed any doses of anticoagulation.   Today, she denies symptoms of chest pain, shortness of breath, orthopnea, PND, lower extremity edema, claudication, dizziness, presyncope, syncope, bleeding, or neurologic sequela. The patient is tolerating medications without difficulties and is otherwise without complaint today.   she denies symptoms of cough, fevers, chills, or new SOB worrisome for COVID 19.     Atrial Fibrillation Risk Factors:  she does not have symptoms or diagnosis of sleep apnea. she does not have a history of  rheumatic fever. she does not have a history of alcohol use. The patient does not have a history of early familial atrial fibrillation or other arrhythmias.  she has a BMI of Body mass index is 22.49 kg/m..  BP 121/65 Pulse 75 Provided by patient with home BP machine  Past Medical History:  Diagnosis Date  . Allergy   . Anemia   . Hyperlipidemia   . Hypertension   . Migraine    Past Surgical History:  Procedure Laterality Date  . APPENDECTOMY    . BREAST BIOPSY    . CARDIOVERSION N/A 12/29/2018   Procedure: CARDIOVERSION;  Surgeon: Dorothy Spark, MD;  Location: [facility] ENDOSCOPY;  Service: Cardiovascular;  Laterality: N/A;  . CATARACT EXTRACTION  05/05/13   right eye   . COLONOSCOPY  2006  . [place]  . WISDOM TOOTH EXTRACTION       Current Outpatient Medications  Medication Sig Dispense Refill  . alendronate (FOSAMAX) 35 MG tablet Take 35 mg by mouth every 7 (seven) days. Take with a full glass of water on an empty stomach.    Marland Kitchen apixaban (ELIQUIS) 5 MG TABS tablet Take 1 tablet (5 mg total) by mouth 2 (two) times daily for 30 days. 60 tablet 0  . diltiazem (CARDIZEM CD) 240 MG 24 hr capsule Take 1 capsule (240 mg total) by mouth daily. 90 capsule 3  . lisinopril-hydrochlorothiazide (PRINZIDE,ZESTORETIC) 10-12.5 MG tablet Take 1 tablet by mouth daily. 90 tablet 3  . Multiple Vitamins-Minerals (MULTIVITAMIN WITH MINERALS) tablet Take 1 tablet by  mouth daily.    . Omega-3 Fatty Acids (FISH OIL) 1200 MG CAPS Take 1 capsule by mouth daily.    . simvastatin (ZOCOR) 20 MG tablet Take 1 tablet (20 mg total) by mouth daily. 90 tablet 3  . sodium chloride (OCEAN) 0.65 % SOLN nasal spray Place 1 spray into both nostrils as needed for congestion.     No current facility-administered medications for this encounter.     Allergies:   Clindamycin hcl; Codeine; Erythromycin; Tetracycline; Penicillins; and Sulfonamide derivatives   Social History:  The patient   reports that she has never smoked. She has never used smokeless tobacco. She reports that she does not drink alcohol or use drugs.   Family History:  The patient's  family history includes Cancer in her mother; Diabetes in her father; Emphysema in her father; Heart disease in her mother; Hyperlipidemia in her mother and sister; Hypertension in her mother and sister; Hypothyroidism in her sister.    ROS:  Please see the history of present illness.   All other systems are personally reviewed and negative.   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   ASSESSMENT AND PLAN:  1. Paroxysmal atrial fibrillation Patient has had recurrent symptoms of AF requiring two DCCV, rate control appears insufficient.  We discussed therapeutic options including flecainide, Multaq, amiodarone, and ablation.  She is interested in possible ablation. Will refer to Dr Rayann Heman for consideration. If AAD is pursued, she prefers amiodarone to flecainide or Multaq after discussing the risks and benefits of each. Will also start diltiazem 30 mg PRN q4 hrs for heart racing. Return to ER precautions given and AF education provided. Will need repeat echocardiogram. Continue diltiazem 240 mg daily Continue Eliquis 5 mg BID.   This patients CHA2DS2-VASc Score and unadjusted Ischemic Stroke Rate (% per year) is equal to 4.8 % stroke rate/year from a score of 4  Above score calculated as 1 point each if present [CHF, HTN, DM,  Vascular=MI/PAD/Aortic Plaque, Age if 65-74, or Female] Above score calculated as 2 points each if present [Age > 75, or Stroke/TIA/TE]  2. HTN Stable, no changes today.  3. HLD Simvastatin stopped 2/2 interaction with diltiazem.  Will start atorvastatin 10 mg daily. Lipids followed by PCP   COVID screen The patient does not have any symptoms that suggest any further testing/ screening at this time.  Social distancing reinforced today.    Follow-up with Dr Rayann Heman for possible ablation.   Current medicines are reviewed at length with the patient today.   The patient does not have concerns regarding her medicines.  The following changes were made today:  Start diltiazem 30 mg PRN  Labs/ tests ordered today include:  No orders of the defined types were placed in this encounter.   Patient Risk:  after full review of this patients clinical status, I feel that they are at moderate risk at this time.   Today, I have spent 40 minutes with the patient with telehealth technology discussing atrial fibrillation, anticoagulation, AAD therapy, ablation, and COVID-19 precautions.    Signed, Adline Peals PA-C  01/05/2019 12:29 PM  Afib [facility] [place], [place] [address] [phone]   I hereby voluntarily request, consent and authorize the [facility] and its employed or contracted physicians, physician assistants, nurse practitioners or other licensed health care professionals (the Practitioner), to provide me with telemedicine health care services (the "Services") as deemed necessary by the treating Practitioner. I acknowledge and consent to receive the Services by the Practitioner via telemedicine. I understand that the telemedicine visit will involve communicating with the Practitioner through live audiovisual communication technology and the disclosure of certain medical information by electronic transmission. I acknowledge that I have  been given the opportunity to request an in-person assessment or other available alternative prior to the telemedicine visit and am voluntarily participating in the telemedicine visit.   I understand that I have the right to withhold or withdraw my consent to the use of telemedicine in the course of my care at any time, without affecting my right to future care or treatment, and that the Practitioner or I may terminate the telemedicine visit at any time. I understand that I have the right to inspect all information obtained and/or recorded in the course of the telemedicine visit and may receive copies of available information for a reasonable fee.  I understand that some of the potential risks of receiving the Services via telemedicine include:   Delay or interruption in medical evaluation due to technological equipment failure or disruption;  Information transmitted may not be sufficient (e.g. poor resolution of images) to allow for appropriate medical decision making by the Practitioner; and/or  In rare instances, security protocols could fail, causing a breach of personal health information.   Furthermore, I acknowledge that it is my responsibility to provide information about my medical history, conditions and care that is complete and accurate to the best of my ability. I acknowledge that Practitioner's advice, recommendations, and/or decision may be based on factors not within their control, such as incomplete or inaccurate data provided by me or distortions of diagnostic images or specimens that may result from electronic transmissions. I understand that the practice of medicine is not an exact science and that Practitioner makes no warranties or guarantees regarding treatment outcomes. I acknowledge that I will receive a copy of this consent concurrently upon execution via email to the email address I last provided but may also request a printed copy by calling the office of the [facility].  I understand that my insurance will be billed for this visit.   I have read or had this consent read to me.  I understand the contents of this consent, which adequately explains the benefits and risks of the Services being provided via telemedicine.  I have been provided ample opportunity to ask questions regarding this consent and the Services and have had my questions answered to my satisfaction.  I give my informed consent for the services to be provided through the use of telemedicine in my medical care  By participating in this telemedicine visit I agree to the above.

## 2019-01-06 ENCOUNTER — Telehealth: Payer: Medicare HMO | Admitting: Cardiovascular Disease

## 2019-01-06 ENCOUNTER — Telehealth: Payer: Self-pay

## 2019-01-06 NOTE — Telephone Encounter (Signed)
Spoke with pt regarding appt on 01/10/19. Pt stated she can not upload an EKG, but will check vitals prior to appt. Pt questions and concerns were address.

## 2019-01-10 ENCOUNTER — Encounter: Payer: Self-pay | Admitting: Internal Medicine

## 2019-01-10 ENCOUNTER — Other Ambulatory Visit: Payer: Self-pay

## 2019-01-10 ENCOUNTER — Telehealth (INDEPENDENT_AMBULATORY_CARE_PROVIDER_SITE_OTHER): Payer: Medicare HMO | Admitting: Internal Medicine

## 2019-01-10 ENCOUNTER — Telehealth: Payer: Self-pay | Admitting: Internal Medicine

## 2019-01-10 VITALS — BP 107/67 | HR 98

## 2019-01-10 DIAGNOSIS — I480 Paroxysmal atrial fibrillation: Secondary | ICD-10-CM | POA: Diagnosis not present

## 2019-01-10 DIAGNOSIS — I10 Essential (primary) hypertension: Secondary | ICD-10-CM

## 2019-01-10 MED ORDER — FLECAINIDE ACETATE 50 MG PO TABS: 50.0000 mg | ORAL_TABLET | Freq: Two times a day (BID) | ORAL | 3 refills | Status: DC

## 2019-01-10 NOTE — Telephone Encounter (Signed)
Patient called stating she was having trouble logging into mychart for video visit.  Please call her to visit.

## 2019-01-10 NOTE — Progress Notes (Signed)
Electrophysiology TeleHealth Note   Due to national recommendations of social distancing due to [place] 19, Audio  telehealth visit is felt to be most appropriate for this patient at this time.  See MyChart message from today for patient consent regarding telehealth for [facility].  She has tried to get her computer online for virtual visit with IT without success.  She does not have a smart phone.  We therefore could not do a virtual visit today.   Date:  01/10/2019   ID:  Alexandria Mcdowell, DOB [birth year], MRN [id number]  Location: home Provider location: [address] [place] Evaluation Performed: New patient consult  PCP:  Jinny Sanders, MD  Cardiologist:  Jenkins Rouge, MD  Electrophysiologist:  None   Chief Complaint:  afib  History of Present Illness:    Alexandria Mcdowell is a 78 y.o. female who presents via audio conferencing for a telehealth visit today.   The patient is referred for new consultation regarding afib by Dr Johnsie Cancel and Adline Peals PA-C.  She reports initially being diagnosed with atrial fibrillation 12/21/2018 after developing symptoms of palpitations.   She presented to [facility] ED and was found to have AF with RVR (EKGs personally reviewed).  She was cardioverted and sent home.  She returned to [facility] 4/14 with AF (EKG reviewed) and was observed overnight and again cardioverted.  Even more recently she had atrial flutter 01/04/2019 for which she again required ER cardioversion. She is very anxious about her afib.  She presented to the AF clinic.  She has been started on eliquis and diltiazem.  She is unaware of triggers/ precipitants for her afib.  During afib, she has symptoms of tachypalpitations.  Today, she denies symptoms of  chest pain, shortness of breath, orthopnea, PND, lower extremity edema, claudication, dizziness, presyncope, syncope, bleeding, or neurologic sequela. The patient is tolerating medications without difficulties and is  otherwise without complaint today.   she denies symptoms of cough, fevers, chills, or new SOB worrisome for COVID 19.   Past Medical History:  Diagnosis Date  . Allergy   . Anemia   . Hyperlipidemia   . Hypertension   . Migraine   . Paroxysmal atrial fibrillation [facility])     Past Surgical History:  Procedure Laterality Date  . APPENDECTOMY    . BREAST BIOPSY    . CARDIOVERSION N/A 12/29/2018   Procedure: CARDIOVERSION;  Surgeon: Dorothy Spark, MD;  [facility] ENDOSCOPY;  Service: Cardiovascular;  Laterality: N/A;  . CATARACT EXTRACTION  05/05/13   right eye   . COLONOSCOPY  2006  . [place]  . WISDOM TOOTH EXTRACTION      Current Outpatient Medications  Medication Sig Dispense Refill  . alendronate (FOSAMAX) 35 MG tablet Take 35 mg by mouth every 7 (seven) days. Take with a full glass of water on an empty stomach.    Marland Kitchen apixaban (ELIQUIS) 5 MG TABS tablet Take 1 tablet (5 mg total) by mouth 2 (two) times daily for 30 days. 60 tablet 0  . atorvastatin (LIPITOR) 10 MG tablet Take 1 tablet (10 mg total) by mouth daily. 90 tablet 1  . diltiazem (CARDIZEM CD) 240 MG 24 hr capsule Take 1 capsule (240 mg total) by mouth daily. 90 capsule 3  . diltiazem (CARDIZEM) 30 MG tablet Take 1 tablet every 4 hours AS NEEDED for AFIB heart rate >100 45 tablet 1  . lisinopril-hydrochlorothiazide (PRINZIDE,ZESTORETIC) 10-12.5 MG  tablet Take 1 tablet by mouth daily. 90 tablet 3  . Multiple Vitamins-Minerals (MULTIVITAMIN WITH MINERALS) tablet Take 1 tablet by mouth daily.    . Omega-3 Fatty Acids (FISH OIL) 1200 MG CAPS Take 1 capsule by mouth daily.    . sodium chloride (OCEAN) 0.65 % SOLN nasal spray Place 1 spray into both nostrils as needed for congestion.     No current facility-administered medications for this visit.     Allergies:   Clindamycin hcl; Codeine; Erythromycin; Tetracycline; Penicillins; and Sulfonamide derivatives   Social History:  The patient   reports that she has never smoked. She has never used smokeless tobacco. She reports that she does not drink alcohol or use drugs.   Family History:  The patient's  family history includes Cancer in her mother; Diabetes in her father; Emphysema in her father; Heart disease in her mother; Hyperlipidemia in her mother and sister; Hypertension in her mother and sister; Hypothyroidism in her sister.    ROS:  Please see the history of present illness.   All other systems are personally reviewed and negative.    Exam:    Vital Signs:  BP 107/67   Pulse 98    Well sounding   Labs/Other Tests and Data Reviewed:    Recent Labs: 06/23/2018: ALT 28 12/28/2018: TSH 0.732 01/04/2019: BUN 11; Creatinine, Ser 0.88; Hemoglobin 15.4; Magnesium 1.8; Platelets 252; Potassium 3.4; Sodium 138   Wt Readings from Last 3 Encounters:  01/05/19 121 lb (54.9 kg)  01/04/19 121 lb 2 oz (54.9 kg)  12/29/18 121 lb (54.9 kg)     Other studies personally reviewed: Additional studies/ records that were reviewed today include: AF clinic notes, Dr Mariana Arn notes Review of the above records today demonstrates: as above Prior radiographs: CXR 01/04/2019- reveals no air space disease    ASSESSMENT & PLAN:    1. Paroxysmal atrial fibrillation The patient has symptomatic, recurrent paroxysmal atrial fibrillation. Chads2vasc score is 4.  she is anticoagulated with eliquis . Therapeutic strategies for afib including medicine (flecainide) and ablation were discussed in detail with the patient today.   Consider increasing if AF episodes increase to 100mg  BID She is not taking diltiazem currently except prn.  We may consider daily diltiazem if needed.  2. HTN lipitor started by AF clinic  3. HL Stable No change required today  4. COVID screen The patient does not have any symptoms that suggest any further testing/ screening at this time.  Social distancing reinforced today.  Follow-up:  AF clinic in 1 week   Current medicines are reviewed at length with the patient today.   The patient does not have concerns regarding her medicines.  The following changes were made today:  none   Patient Risk:  after full review of this patients clinical status, I feel that they are at moderate risk at this time.   Today, I have spent 22 minutes with the patient with telehealth technology discussing afib management .    SignedThompson Grayer MD, [facility] 01/10/2019 10:07 AM   [facility] [address] [place]  [address] [phone] (office) [phone] (fax)

## 2019-01-10 NOTE — Telephone Encounter (Signed)
April calling.  No further action needed on this phone note.

## 2019-01-13 ENCOUNTER — Ambulatory Visit (HOSPITAL_COMMUNITY): Payer: Medicare HMO | Admitting: Physician Assistant

## 2019-01-20 ENCOUNTER — Ambulatory Visit (HOSPITAL_COMMUNITY)
Admission: RE | Admit: 2019-01-20 | Discharge: 2019-01-20 | Disposition: A | Discharge status: Home / Self Care | Payer: Medicare HMO | Source: Ambulatory Visit | Attending: Physician Assistant | Admitting: Physician Assistant

## 2019-01-20 ENCOUNTER — Other Ambulatory Visit: Payer: Self-pay

## 2019-01-20 ENCOUNTER — Encounter (HOSPITAL_COMMUNITY): Payer: Self-pay | Admitting: Physician Assistant

## 2019-01-20 VITALS — BP 136/78 | HR 91 | Ht 61.5 in | Wt 121.6 lb

## 2019-01-20 DIAGNOSIS — Z885 Allergy status to narcotic agent status: Secondary | ICD-10-CM | POA: Diagnosis not present

## 2019-01-20 DIAGNOSIS — Z7983 Long term (current) use of bisphosphonates: Secondary | ICD-10-CM | POA: Diagnosis not present

## 2019-01-20 DIAGNOSIS — Z882 Allergy status to sulfonamides status: Secondary | ICD-10-CM | POA: Insufficient documentation

## 2019-01-20 DIAGNOSIS — I480 Paroxysmal atrial fibrillation: Secondary | ICD-10-CM | POA: Insufficient documentation

## 2019-01-20 DIAGNOSIS — E785 Hyperlipidemia, unspecified: Secondary | ICD-10-CM | POA: Diagnosis not present

## 2019-01-20 DIAGNOSIS — Z7901 Long term (current) use of anticoagulants: Secondary | ICD-10-CM | POA: Insufficient documentation

## 2019-01-20 DIAGNOSIS — Z8249 Family history of ischemic heart disease and other diseases of the circulatory system: Secondary | ICD-10-CM | POA: Diagnosis not present

## 2019-01-20 DIAGNOSIS — Z881 Allergy status to other antibiotic agents status: Secondary | ICD-10-CM | POA: Insufficient documentation

## 2019-01-20 DIAGNOSIS — Z833 Family history of diabetes mellitus: Secondary | ICD-10-CM | POA: Insufficient documentation

## 2019-01-20 DIAGNOSIS — Z79899 Other long term (current) drug therapy: Secondary | ICD-10-CM | POA: Insufficient documentation

## 2019-01-20 DIAGNOSIS — Z880 Allergy status to penicillin: Secondary | ICD-10-CM | POA: Insufficient documentation

## 2019-01-20 DIAGNOSIS — I10 Essential (primary) hypertension: Secondary | ICD-10-CM | POA: Insufficient documentation

## 2019-01-20 DIAGNOSIS — Z8349 Family history of other endocrine, nutritional and metabolic diseases: Secondary | ICD-10-CM | POA: Diagnosis not present

## 2019-01-20 MED ORDER — APIXABAN 5 MG PO TABS: 5.0000 mg | ORAL_TABLET | Freq: Two times a day (BID) | ORAL | 1 refills | Status: DC

## 2019-01-20 NOTE — Progress Notes (Signed)
Primary Care Physician: Jinny Sanders, MD Primary Cardiologist: Dr Johnsie Cancel Primary Electrophysiologist: Dr Rayann Heman Referring Physician: [name] is a 78 y.o. female with a history of paroxysmal atrial fibrillation who presents for follow up in the [facility]. Patient has recently started flecainide for her paroxysmal atrial fibrillation. She reports doing very well since her last visit with no palpitations or heart racing.  Today, she denies symptoms of palpitations, chest pain, shortness of breath, orthopnea, PND, lower extremity edema, dizziness, presyncope, syncope, snoring, daytime somnolence, bleeding, or neurologic sequela. The patient is tolerating medications without difficulties and is otherwise without complaint today.    Atrial Fibrillation Risk Factors:  she does not have symptoms or diagnosis of sleep apnea. she does not have a history of rheumatic fever. she does not have a history of alcohol use. The patient does not have a history of early familial atrial fibrillation or other arrhythmias.  she has a BMI of Body mass index is 22.6 kg/m.Marland Kitchen Filed Weights   01/20/19 1000  Weight: 55.2 kg    Family History  Problem Relation Age of Onset  . Hypertension Mother   . Hyperlipidemia Mother   . Cancer Mother        breast/colon  . Heart disease Mother   . Diabetes Father   . Emphysema Father   . Hyperlipidemia Sister   . Hypertension Sister   . Hypothyroidism Sister      Atrial Fibrillation Management history:  Previous antiarrhythmic drugs: flecainide Previous cardioversions: 12/29/18, 01/04/19 Previous ablations: none CHADS2VASC score: 4 (female, age, HTN) Anticoagulation history: Eliquis    Past Medical History:  Diagnosis Date  . Allergy   . Anemia   . Hyperlipidemia   . Hypertension   . Migraine   . Paroxysmal atrial fibrillation [facility])    Past Surgical History:  Procedure Laterality Date  .  APPENDECTOMY    . BREAST BIOPSY    . CARDIOVERSION N/A 12/29/2018   Procedure: CARDIOVERSION;  Surgeon: Dorothy Spark, MD;  Location: [facility] ENDOSCOPY;  Service: Cardiovascular;  Laterality: N/A;  . CATARACT EXTRACTION  05/05/13   right eye   . COLONOSCOPY  2006  . [place]  . WISDOM TOOTH EXTRACTION      Current Outpatient Medications  Medication Sig Dispense Refill  . alendronate (FOSAMAX) 35 MG tablet Take 35 mg by mouth every 7 (seven) days. Take with a full glass of water on an empty stomach.    Marland Kitchen apixaban (ELIQUIS) 5 MG TABS tablet Take 1 tablet (5 mg total) by mouth 2 (two) times daily for 30 days. 180 tablet 1  . atorvastatin (LIPITOR) 10 MG tablet Take 1 tablet (10 mg total) by mouth daily. 90 tablet 1  . diltiazem (CARDIZEM) 30 MG tablet Take 1 tablet every 4 hours AS NEEDED for AFIB heart rate >100 45 tablet 1  . flecainide (TAMBOCOR) 50 MG tablet Take 1 tablet (50 mg total) by mouth 2 (two) times daily. 180 tablet 3  . lisinopril-hydrochlorothiazide (PRINZIDE,ZESTORETIC) 10-12.5 MG tablet Take 1 tablet by mouth daily. 90 tablet 3  . Multiple Vitamins-Minerals (MULTIVITAMIN WITH MINERALS) tablet Take 1 tablet by mouth daily.    . Omega-3 Fatty Acids (FISH OIL) 1200 MG CAPS Take 1 capsule by mouth daily.    . sodium chloride (OCEAN) 0.65 % SOLN nasal spray Place 1 spray into both nostrils as needed for congestion.    Marland Kitchen diltiazem (CARDIZEM CD) 240  MG 24 hr capsule Take 1 capsule (240 mg total) by mouth daily. (Patient not taking: Reported on 01/20/2019) 90 capsule 3   No current facility-administered medications for this encounter.     Allergies  Allergen Reactions  . Clindamycin Hcl Nausea And Vomiting    nervous  . Codeine Nausea And Vomiting  . Erythromycin Nausea And Vomiting  . Tetracycline Nausea And Vomiting  . Penicillins Rash    Did it involve swelling of the face/tongue/throat, SOB, or low BP? No Did it involve sudden or severe rash/hives,  skin peeling, or any reaction on the inside of your mouth or nose? Yes Did you need to seek medical attention at a hospital or doctor's office? Yes When did it last happen?childhood If all above answers are "NO", may proceed with cephalosporin use.   . Sulfonamide Derivatives Nausea And Vomiting and Rash    Social History   Socioeconomic History  . Marital status: Married    Spouse name: Not on file  . Number of children: Not on file  . Years of education: Not on file  . Highest education level: Not on file  Occupational History  . Not on file  Social Needs  . Financial resource strain: Not on file  . Food insecurity:    Worry: Not on file    Inability: Not on file  . Transportation needs:    Medical: Not on file    Non-medical: Not on file  Tobacco Use  . Smoking status: Never Smoker  . Smokeless tobacco: Never Used  Substance and Sexual Activity  . Alcohol use: No  . Drug use: No  . Sexual activity: Yes  Lifestyle  . Physical activity:    Days per week: Not on file    Minutes per session: Not on file  . Stress: Not on file  Relationships  . Social connections:    Talks on phone: Not on file    Gets together: Not on file    Attends religious service: Not on file    Active member of club or organization: Not on file    Attends meetings of clubs or organizations: Not on file    Relationship status: Not on file  . Intimate partner violence:    Fear of current or ex partner: Not on file    Emotionally abused: Not on file    Physically abused: Not on file    Forced sexual activity: Not on file  Other Topics Concern  . 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 Laymen's guide and forms provided.    Reviewed  Aug '15      Exercsie:5 days a week, treadmill   Diet: healthy, weight watchers.         Attends [place] are reviewed and negative except as per the HPI above.  Physical Exam: Vitals:   01/20/19 1000  BP: 136/78  Pulse: 91  Weight: 55.2 kg  Height: 5' 1.5" (1.562 m)    GEN- The patient is well appearing elderly female, alert and oriented x 3 today.   Head- normocephalic, atraumatic Eyes-  Sclera clear, conjunctiva pink Ears- hearing intact Oropharynx- clear Neck-  supple  Lungs- Clear to ausculation bilaterally, normal work of breathing Heart- Regular rate and rhythm, no murmurs, rubs or gallops  GI- soft, NT, ND, + BS Extremities- no clubbing, cyanosis, or edema MS- no significant deformity or atrophy Skin- no rash or lesion Psych- euthymic mood, full affect Neuro- strength and sensation are intact  Wt Readings from Last 3 Encounters:  01/20/19 55.2 kg  01/05/19 54.9 kg  01/04/19 54.9 kg    EKG today demonstrates SR HR 91, NST, PR 178, QRS 84, QTc 428.  Echo 06/21/2014 demonstrated  - Left ventricle: The cavity size was normal. Wall thickness was normal. Systolic function was normal. The estimated ejection fraction was in the range of 55% to 60%. Wall motion was normal; there were no regional wall motion abnormalities. 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 E/E&' ratio is >15, suggesting elevated LV filling pressure. - Mitral valve: Mildly thickened leaflets . Mild late systolic bileaflet prolapse. There was moderate regurgitation. - Left atrium: The atrium was moderately dilated (39 ml/m2). - Right atrium: The atrium was mildly dilated. - Atrial septum: No defect or patent foramen ovale was identified.  Epic records are reviewed at length today  Assessment and Plan:  1. Paroxysmal atrial fibrillation The patient has paroxysmal atrial fibrillation s/p DCCV 4/15 and 4/21. Doing well on flecainide  with no symptoms. Continue flecainide 50 mg BID Continue Eliquis 5 mg BID Continue diltiazem 240 mg daily and diltiazem 30 mg PRN. Will consider stress test on flecainide once COVID-19 precautions end as she is very active for her age.   This patients CHA2DS2-VASc Score and unadjusted Ischemic Stroke Rate (% per year) is equal to 4.8 % stroke rate/year from a score of 4  Above score calculated as 1 point each if present [CHF, HTN, DM, Vascular=MI/PAD/Aortic Plaque, Age if 65-74, or Female] Above score calculated as 2 points each if present [Age > 75, or Stroke/TIA/TE]  2. HTN Stable, no changes today   Follow up in AF clinic in 2 months.  [facility] [address] [place], [place] [address] [phone] 01/21/2019 3:23 PM

## 2019-01-25 ENCOUNTER — Emergency Department (HOSPITAL_COMMUNITY)
Admission: EM | Admit: 2019-01-25 | Discharge: 2019-01-25 | Disposition: A | Discharge status: Home / Self Care | Payer: Medicare HMO | Attending: Emergency Medicine | Admitting: Emergency Medicine

## 2019-01-25 ENCOUNTER — Emergency Department (HOSPITAL_COMMUNITY): Payer: Medicare HMO

## 2019-01-25 ENCOUNTER — Encounter (HOSPITAL_COMMUNITY): Payer: Self-pay | Admitting: Emergency Medicine

## 2019-01-25 ENCOUNTER — Other Ambulatory Visit: Payer: Self-pay

## 2019-01-25 DIAGNOSIS — Z7901 Long term (current) use of anticoagulants: Secondary | ICD-10-CM | POA: Diagnosis not present

## 2019-01-25 DIAGNOSIS — I10 Essential (primary) hypertension: Secondary | ICD-10-CM | POA: Diagnosis not present

## 2019-01-25 DIAGNOSIS — R000 Tachycardia, unspecified: Secondary | ICD-10-CM | POA: Diagnosis not present

## 2019-01-25 DIAGNOSIS — I480 Paroxysmal atrial fibrillation: Secondary | ICD-10-CM | POA: Insufficient documentation

## 2019-01-25 DIAGNOSIS — Z79899 Other long term (current) drug therapy: Secondary | ICD-10-CM | POA: Diagnosis not present

## 2019-01-25 DIAGNOSIS — R002 Palpitations: Secondary | ICD-10-CM | POA: Diagnosis not present

## 2019-01-25 DIAGNOSIS — I499 Cardiac arrhythmia, unspecified: Secondary | ICD-10-CM | POA: Diagnosis not present

## 2019-01-25 LAB — CBC
HCT: 43.3 % (ref 36.0–46.0)
Hemoglobin: 14.8 g/dL (ref 12.0–15.0)
MCH: 30.7 pg (ref 26.0–34.0)
MCHC: 34.2 g/dL (ref 30.0–36.0)
MCV: 89.8 fL (ref 80.0–100.0)
Platelets: 299 10*3/uL (ref 150–400)
RBC: 4.82 MIL/uL (ref 3.87–5.11)
RDW: 12.2 % (ref 11.5–15.5)
WBC: 7.9 10*3/uL (ref 4.0–10.5)
nRBC: 0 % (ref 0.0–0.2)

## 2019-01-25 LAB — BASIC METABOLIC PANEL
Anion gap: 11 (ref 5–15)
BUN: 16 mg/dL (ref 8–23)
CO2: 23 mmol/L (ref 22–32)
Calcium: 9.6 mg/dL (ref 8.9–10.3)
Chloride: 100 mmol/L (ref 98–111)
Creatinine, Ser: 0.9 mg/dL (ref 0.44–1.00)
GFR calc Af Amer: >60 mL/min (ref >60)
GFR calc non Af Amer: >60 mL/min (ref >60)
Glucose, Bld: 126 mg/dL — ABNORMAL HIGH (ref 70–99)
Potassium: 3.6 mmol/L (ref 3.5–5.1)
Sodium: 134 mmol/L — ABNORMAL LOW (ref 135–145)

## 2019-01-25 LAB — TROPONIN I: Troponin I: <0.03 ng/mL (ref <0.03)

## 2019-01-25 MED ORDER — SODIUM CHLORIDE 0.9% FLUSH
3.0000 mL | Freq: Once | INTRAVENOUS | Status: AC
  Administered 2019-01-25: 02:00:00 3 mL via INTRAVENOUS

## 2019-01-25 MED ORDER — PROPOFOL 10 MG/ML IV BOLUS
100.0000 mg | Freq: Once | INTRAVENOUS | Status: AC
  Administered 2019-01-25: 03:00:00 50 mg via INTRAVENOUS
  Filled 2019-01-25: qty 20

## 2019-01-25 NOTE — Sedation Documentation (Signed)
Family updated as to patient's status.

## 2019-01-25 NOTE — Sedation Documentation (Signed)
Patient denies pain.

## 2019-01-25 NOTE — Sedation Documentation (Signed)
EDP at bedside  

## 2019-01-25 NOTE — Sedation Documentation (Signed)
RT at bedside.

## 2019-01-25 NOTE — Sedation Documentation (Signed)
Pt ambulating well to the bathroom

## 2019-01-25 NOTE — Discharge Instructions (Signed)
Please note, we have increased your flecainide like we discussed.  You will now be taking 2 tablets  (100mg ) twice daily which I have written on your bottle for you. Continue all your other medications as directed. Follow-up in the cardiology clinic as soon as possible. Return here for any new or recurrent symptoms including feelings of A. fib, chest pain, shortness of breath, dizziness, syncope, etc.

## 2019-01-25 NOTE — Sedation Documentation (Signed)
EDP's at bedside [place] and [place].

## 2019-01-25 NOTE — Sedation Documentation (Signed)
Shock administered by Bernarda Caffey, PA. Immediate change into NSR.

## 2019-01-25 NOTE — Progress Notes (Signed)
RT present for cardioversion. Patient tolerated procedure well. No respiratory distress noted.

## 2019-01-25 NOTE — ED Provider Notes (Signed)
Patient seen/examined in the Emergency Department in conjunction with Advanced Practice Provider [place] Patient reports onset of atrial flutter with palpitations.  She has had this multiple times previously Exam : awake alert, no acute distress.  Tachycardic with an irregular rhythm. Plan: Patient with history of recurrent atrial flutter, she has been cardioverted previously.  She is already taking anticoagulation and antiarrhythmics.  The case was discussed with cardiology who recommended cardioversion I had a long discussion with patient including the risk and benefits, she agrees to procedure.  I was present for the entire procedural sedation and electrical cardioversion.  Patient tolerated this very well.   Ripley Fraise, MD 01/25/19 [phone]

## 2019-01-25 NOTE — ED Provider Notes (Signed)
[facility] Provider Note   CSN: [id number] Arrival date & time: 01/25/19  0025    History   Chief Complaint Chief Complaint  Patient presents with  . Tachycardia    HPI [facility] is a 78 y.o. female.     The history is provided by the patient and medical records.     78 year old female with history of seasonal allergies, anemia, hyperlipidemia, hypertension, migraines, paroxysmal A. fib, presenting to the ED with palpitations.  Patient diagnosed with A. fib last month-- Initially presented to ED with AFIB on 12/28/18 and was cardioverted on 12/29/18 by cardiology while inpatient.  She presented back to ED on 01/04/19 and was cardioverted in the ED.  States she has been doing well the past few days, went had a EKG with her primary care doctor last week that was normal.  She went to bed last evening around 8:30 PM and awoke around 10:30 PM to use the bathroom and felt that her heart was racing.  She denies any chest pain, shortness of breath, dizziness, numbness, nausea, diaphoresis, or vomiting.  States it feels exactly like her prior episodes of A. fib.  She is not aware of any triggers to her episodes.  She recently had her dose of diltiazem increased was also started on flecainide, she has not missed any doses of this nor her anticoagulants.  States she is otherwise been well without any fever or other recent illness.  Past Medical History:  Diagnosis Date  . Allergy   . Anemia   . Hyperlipidemia   . Hypertension   . Migraine   . Paroxysmal atrial fibrillation [facility])     Patient Active Problem List   Diagnosis Date Noted  . Atrial flutter with rapid ventricular response ([place]) 12/28/2018  . Atrial fibrillation with RVR ([place])   . Paroxysmal atrial fibrillation ([place]) 12/27/2018  . Hypercalcemia 07/02/2018  . Bradycardia 06/20/2016  . Counseling regarding end of life decision making 05/18/2015  . Osteopenia 01/14/2012  .  Hypercholesterolemia 10/14/2007  . History of anemia 10/14/2007  . Essential hypertension 10/14/2007  . ALLERGIC RHINITIS 10/14/2007  . 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 MIGRAINES, HX OF 10/14/2007    Past Surgical History:  Procedure Laterality Date  . APPENDECTOMY    . BREAST BIOPSY    . CARDIOVERSION N/A 12/29/2018   Procedure: CARDIOVERSION;  Surgeon: Dorothy Spark, MD;  Location[facility] ENDOSCOPY;  Service: Cardiovascular;  Laterality: N/A;  . CATARACT EXTRACTION  05/05/13   right eye   . COLONOSCOPY  2006  . [place]  . WISDOM TOOTH EXTRACTION       OB History   No obstetric history on file.      Home Medications    Prior to Admission medications   Medication Sig Start Date End Date Taking? Authorizing Provider  alendronate (FOSAMAX) 35 MG tablet Take 35 mg by mouth every 7 (seven) days. Take with a full glass of water on an empty stomach.    [provider]  apixaban (ELIQUIS) 5 MG TABS tablet Take 1 tablet (5 mg total) by mouth 2 (two) times daily for 30 days. 01/20/19 02/19/19  Fenton, Clint R, PA  atorvastatin (LIPITOR) 10 MG tablet Take 1 tablet (10 mg total) by mouth daily. 01/05/19   Fenton, Clint R, PA  diltiazem (CARDIZEM CD) 240 MG 24 hr capsule Take 1 capsule (240 mg total) by mouth daily. Patient not taking: Reported  on 01/20/2019 12/30/18   Ledora Bottcher, PA  diltiazem (CARDIZEM) 30 MG tablet Take 1 tablet every 4 hours AS NEEDED for AFIB heart rate >100 01/05/19   Fenton, Clint R, PA  flecainide (TAMBOCOR) 50 MG tablet Take 1 tablet (50 mg total) by mouth 2 (two) times daily. 01/10/19   Allred, Jeneen Rinks, MD  lisinopril-hydrochlorothiazide (PRINZIDE,ZESTORETIC) 10-12.5 MG tablet Take 1 tablet by mouth daily. 07/02/18   Bedsole, Amy E, MD  Multiple Vitamins-Minerals (MULTIVITAMIN WITH MINERALS) tablet Take 1 tablet by mouth daily.    [provider]  Omega-3 Fatty Acids (FISH OIL) 1200 MG CAPS Take 1  capsule by mouth daily.    [provider]  sodium chloride (OCEAN) 0.65 % SOLN nasal spray Place 1 spray into both nostrils as needed for congestion.    [provider]    Family History Family History  Problem Relation Age of Onset  . Hypertension Mother   . Hyperlipidemia Mother   . Cancer Mother        breast/colon  . Heart disease Mother   . Diabetes Father   . Emphysema Father   . Hyperlipidemia Sister   . Hypertension Sister   . Hypothyroidism Sister     Social History Social History   Tobacco Use  . Smoking status: Never Smoker  . Smokeless tobacco: Never Used  Substance Use Topics  . Alcohol use: No  . Drug use: No     Allergies   Clindamycin hcl; Codeine; Erythromycin; Tetracycline; Penicillins; and Sulfonamide derivatives   Review of Systems Review of Systems  Cardiovascular: Positive for palpitations.  All other systems reviewed and are negative.    Physical Exam Updated Vital Signs BP 140/81   Pulse (!) 125   Temp 98.2 F (36.8 C) (Oral)   Resp 20   Ht 5\' 1"  (1.549 m)   Wt 53.5 kg   SpO2 100%   BMI 22.30 kg/m   Physical Exam Vitals signs and nursing note reviewed.  Constitutional:      Appearance: She is well-developed.  HENT:     Head: Normocephalic and atraumatic.     Nose: Nose normal.  Eyes:     Conjunctiva/sclera: Conjunctivae normal.     Pupils: Pupils are equal, round, and reactive to light.  Neck:     Musculoskeletal: Normal range of motion.  Cardiovascular:     Rate and Rhythm: Tachycardia present. Rhythm irregularly irregular.     Heart sounds: Normal heart sounds.     Comments: AFIB 119-131 during exam Pulmonary:     Effort: Pulmonary effort is normal.     Breath sounds: Normal breath sounds.  Abdominal:     General: Bowel sounds are normal.     Palpations: Abdomen is soft.  Musculoskeletal: Normal range of motion.  Skin:    General: Skin is warm and dry.  Neurological:     Mental Status: She is  alert and oriented to person, place, and time.      ED Treatments / Results  Labs (all labs ordered are listed, but only abnormal results are displayed) Labs Reviewed  BASIC METABOLIC PANEL - Abnormal; Notable for the following components:      Result Value   Sodium 134 (*)    Glucose, Bld 126 (*)    All other components within normal limits  CBC  TROPONIN I    EKG EKG Interpretation  Date/Time:  Tuesday Jan 25 2019 01:04:52 EDT Ventricular Rate:  130 PR Interval:  QRS Duration: 88 QT Interval:  334 QTC Calculation: 491 R Axis:   43 Text Interpretation:  Atrial flutter with variable A-V block Nonspecific ST and T wave abnormality Abnormal ECG Confirmed by Ripley Fraise [phone]) on 01/25/2019 1:24:51 AM   Radiology Dg Chest 2 View  Result Date: 01/25/2019 CLINICAL DATA:  Irregular heartbeat. EXAM: CHEST - 2 VIEW COMPARISON:  01/04/2019 FINDINGS: The heart size and mediastinal contours are within normal limits. Both lungs are clear. The visualized skeletal structures are unremarkable. IMPRESSION: Clear lungs. Electronically Signed   By: Ulyses Jarred M.D.   On: 01/25/2019 02:00    Procedures .Sedation Date/Time: 01/25/2019 3:20 AM Performed by: Larene Pickett, PA-C Authorized by: Larene Pickett, PA-C   Consent:    Consent obtained:  Verbal   Consent given by:  Patient   Risks discussed:  Allergic reaction, dysrhythmia, prolonged hypoxia resulting in organ damage, inadequate sedation and nausea Universal protocol:    Procedure explained and questions answered to patient or proxy's satisfaction: yes     Relevant documents present and verified: yes     Test results available and properly labeled: yes     Imaging studies available: yes     Required blood products, implants, devices, and special equipment available: yes     Site/side marked: yes     Immediately prior to procedure a time out was called: yes     Patient identity confirmation method:  Arm band and  hospital-assigned identification number Indications:    Procedure performed:  Cardioversion Pre-sedation assessment:    Time since last food or drink:  6pm   ASA classification: class 2 - patient with mild systemic disease     Neck mobility: normal     Mouth opening:  3 or more finger widths   Mallampati score:  I - soft palate, uvula, fauces, pillars visible   Pre-sedation assessments completed and reviewed: airway patency, cardiovascular function, mental status, nausea/vomiting and pain level     Pre-sedation assessment completed:  01/25/2019 3:06 AM Immediate pre-procedure details:    Reassessment: Patient reassessed immediately prior to procedure     Reviewed: vital signs     Verified: bag valve mask available, emergency equipment available, intubation equipment available and IV patency confirmed   Procedure details (see MAR for exact dosages):    Preoxygenation:  Nasal cannula   Sedation:  Propofol   Intra-procedure monitoring:  Blood pressure monitoring, cardiac monitor, continuous capnometry, continuous pulse oximetry, frequent vital sign checks and frequent LOC assessments   Intra-procedure events: none     Intra-procedure management:  Fluid bolus   Total Provider sedation time (minutes):  20 Post-procedure details:    Post-sedation assessment completed:  01/25/2019 3:23 AM   Attendance: Constant attendance by certified staff until patient recovered     Recovery: Patient returned to pre-procedure baseline     Post-sedation assessments completed and reviewed: airway patency, cardiovascular function, hydration status, mental status, nausea/vomiting and pain level     Patient is stable for discharge or admission: yes     Patient tolerance:  Tolerated well, no immediate complications .Cardioversion Date/Time: 01/25/2019 3:23 AM Performed by: Larene Pickett, PA-C Authorized by: Larene Pickett, PA-C   Consent:    Consent obtained:  Verbal   Consent given by:  Patient   Risks  discussed:  Cutaneous burn and death   Alternatives discussed:  No treatment and rate-control medication Universal protocol:    Procedure explained and questions answered to patient or proxy's  satisfaction: yes     Relevant documents present and verified: yes     Test results available and properly labeled: yes     Imaging studies available: yes     Required blood products, implants, devices, and special equipment available: yes     Site/side marked: yes     Immediately prior to procedure a time out was called: yes     Patient identity confirmed:  Verbally with patient, arm band and hospital-assigned identification number Pre-procedure details:    Cardioversion basis:  Emergent   Rhythm:  Atrial fibrillation   Electrode placement:  Anterior-posterior Patient sedated: Yes. Refer to sedation procedure documentation for details of sedation.  Attempt one:    Cardioversion mode:  Synchronous   Waveform:  Biphasic   Shock (Joules):  150   Shock outcome:  Conversion to normal sinus rhythm Post-procedure details:    Patient status:  Awake   Patient tolerance of procedure:  Tolerated well, no immediate complications   (including critical care time)  CRITICAL CARE Performed by: Larene Pickett   Total critical care time: 45 minutes  Critical care time was exclusive of separately billable procedures and treating other patients.  Critical care was necessary to treat or prevent imminent or life-threatening deterioration.  Critical care was time spent personally by me on the following activities: development of treatment plan with patient and/or surrogate as well as nursing, discussions with consultants, evaluation of patient's response to treatment, examination of patient, obtaining history from patient or surrogate, ordering and performing treatments and interventions, ordering and review of laboratory studies, ordering and review of radiographic studies, pulse oximetry and re-evaluation of  patient's condition.   Medications Ordered in ED Medications  sodium chloride flush (NS) 0.9 % injection 3 mL (3 mLs Intravenous Given 01/25/19 0147)  propofol (DIPRIVAN) 10 mg/mL bolus/IV push 100 mg (50 mg Intravenous Given 01/25/19 0311)     Initial Impression / Assessment and Plan / ED Course  I have reviewed the triage vital signs and the nursing notes.  Pertinent labs & imaging results that were available during my care of the patient were reviewed by me and considered in my medical decision making (see chart for details).  78 y.o. F here in AFIB.  Initially diagnosed last month, has already been cardioverted x2.  She is on diltiazem, flecainide, and Eliquis and has been compliant.  She has had full work-up thus far including thyroid studies.  There are no known triggers to her episodes she is aware of.  Went to bed as normal around 8:30 PM but awoke around 10:30 PM to use the bathroom and felt palpitations.  She is not having any chest pain or shortness of breath at this time.  States this feels exactly like her prior episodes.  She remains in A. fib on arrival to the ED with rates from 1119-131 during exam.  She is hemodynamically stable at this time.  Given prior cardioversion x2 as well as dual antiarrhythmic therapy, will discuss with cardiology for recommendations.  Discussed with cardiology, Dr. Sonia Side-- recommended attempt cardioversion here.  If successful, would increased flecainide to 100 mg twice daily and have her follow-up closely with Dr. Rayann Heman for possible ablation.  If unsuccessful, will admit for ongoing management.  Patient is comfortable with this plan.  I have called and updated her husband [facility] as well, he is waiting in the parking lot.  Will call back after procedure and let him know further details.  3:23 AM Patient  cardioverted here successfully on single attempt.  Attending physician, Dr. Christy Gentles, supervised entire procedure.  Patient tolerated very well. She is  awake, alert, back to baseline at this time.  Will monitor here for about an hour to ensure she stays in NSR.  I have called and updated her husband.  4:47 AM Patient remains in NSR here.  She feels well, no complaints of palpitations or pain.  VSS.  Feel she is stable for discharge.  We will increase her flecainide to 100 mg twice daily as recommended by cardiology.  We will have her follow-up in the clinic as soon as possible.  She understands to return here for any new/acute changes or recurrence of AFIB.  Final Clinical Impressions(s) / ED Diagnoses   Final diagnoses:  Paroxysmal atrial fibrillation [facility])    ED Discharge Orders    None       Larene Pickett, PA-C 01/25/19 [id number]    Ripley Fraise, MD 01/25/19 [phone]

## 2019-01-25 NOTE — Consult Note (Signed)
Cardiology Consultation:   Patient ID: [facility] MRN: [id number]; DOB: [birth year]  Admit date: 01/25/2019 Date of Consult: 01/25/2019  Primary Care Provider: Jinny Sanders, MD Primary Cardiologist: Jenkins Rouge, MD  Primary Electrophysiologist:  Dr. Thompson Grayer   Patient Profile:   [facility] is a 78 y.o. female with a hx of A. fib who is being seen today for the evaluation of recurrent A. fib.  History of Present Illness:   [facility] developed new onset A. Fib in April. She presented to the ER on April 8th with palpitations and was cardioverted and started on eliquis. She has been followed closely by cardiology and EP since that time. Unfortunately, her first cardioversion failed on 4/8 and she presented again for repeat cardioversion on 4/15 and then again on 4/21. She has been compliant with her home diltiazem and eliquis and was started on flecainide 50mg  BID to help control her rhythm. Unfortunately, this evening she woke up from sleep around 10:30pm with palpitations and presented to the ER where she was again found to be in A. Fib. She denies any chest pain, shortness of breath, fevers, chills, nausea, vomiting or diarrhea.  Past Medical History:  Diagnosis Date  . Allergy   . Anemia   . Hyperlipidemia   . Hypertension   . Migraine   . Paroxysmal atrial fibrillation [facility])     Past Surgical History:  Procedure Laterality Date  . APPENDECTOMY    . BREAST BIOPSY    . CARDIOVERSION N/A 12/29/2018   Procedure: CARDIOVERSION;  Surgeon: Dorothy Spark, MD;  Location: [facility] ENDOSCOPY;  Service: Cardiovascular;  Laterality: N/A;  . CATARACT EXTRACTION  05/05/13   right eye   . COLONOSCOPY  2006  . [place]  . WISDOM TOOTH EXTRACTION       Home Medications:  Prior to Admission medications   Medication Sig Start Date End Date Taking? Authorizing Provider  alendronate (FOSAMAX) 35 MG tablet Take 35 mg by mouth every 7 (seven) days.  Take with a full glass of water on an empty stomach.    [provider]  apixaban (ELIQUIS) 5 MG TABS tablet Take 1 tablet (5 mg total) by mouth 2 (two) times daily for 30 days. 01/20/19 02/19/19  Fenton, Clint R, PA  atorvastatin (LIPITOR) 10 MG tablet Take 1 tablet (10 mg total) by mouth daily. 01/05/19   Fenton, Clint R, PA  diltiazem (CARDIZEM CD) 240 MG 24 hr capsule Take 1 capsule (240 mg total) by mouth daily. Patient not taking: Reported on 01/20/2019 12/30/18   Ledora Bottcher, PA  diltiazem (CARDIZEM) 30 MG tablet Take 1 tablet every 4 hours AS NEEDED for AFIB heart rate >100 01/05/19   Fenton, Clint R, PA  flecainide (TAMBOCOR) 50 MG tablet Take 1 tablet (50 mg total) by mouth 2 (two) times daily. 01/10/19   Allred, Jeneen Rinks, MD  lisinopril-hydrochlorothiazide (PRINZIDE,ZESTORETIC) 10-12.5 MG tablet Take 1 tablet by mouth daily. 07/02/18   Bedsole, Amy E, MD  Multiple Vitamins-Minerals (MULTIVITAMIN WITH MINERALS) tablet Take 1 tablet by mouth daily.    [provider]  Omega-3 Fatty Acids (FISH OIL) 1200 MG CAPS Take 1 capsule by mouth daily.    [provider]  sodium chloride (OCEAN) 0.65 % SOLN nasal spray Place 1 spray into both nostrils as needed for congestion.    [provider]    Inpatient Medications: Scheduled Meds:  Continuous Infusions:  PRN Meds:   Allergies:  Allergies  Allergen Reactions  . Clindamycin Hcl Nausea And Vomiting    nervous  . Codeine Nausea And Vomiting  . Erythromycin Nausea And Vomiting  . Tetracycline Nausea And Vomiting  . Penicillins Rash    Did it involve swelling of the face/tongue/throat, SOB, or low BP? No Did it involve sudden or severe rash/hives, skin peeling, or any reaction on the inside of your mouth or nose? Yes Did you need to seek medical attention at a hospital or doctor's office? Yes When did it last happen?childhood If all above answers are "NO", may proceed with cephalosporin use.    . Sulfonamide Derivatives Nausea And Vomiting and Rash    Social History:   Social History   Socioeconomic History  . Marital status: Married    Spouse name: Not on file  . Number of children: Not on file  . Years of education: Not on file  . Highest education level: Not on file  Occupational History  . Not on file  Social Needs  . Financial resource strain: Not on file  . Food insecurity:    Worry: Not on file    Inability: Not on file  . Transportation needs:    Medical: Not on file    Non-medical: Not on file  Tobacco Use  . Smoking status: Never Smoker  . Smokeless tobacco: Never Used  Substance and Sexual Activity  . Alcohol use: No  . Drug use: No  . Sexual activity: Yes  Lifestyle  . Physical activity:    Days per week: Not on file    Minutes per session: Not on file  . Stress: Not on file  Relationships  . Social connections:    Talks on phone: Not on file    Gets together: Not on file    Attends religious service: Not on file    Active member of club or organization: Not on file    Attends meetings of clubs or organizations: Not on file    Relationship status: Not on file  . Intimate partner violence:    Fear of current or ex partner: Not on file    Emotionally abused: Not on file    Physically abused: Not on file    Forced sexual activity: Not on file  Other Topics Concern  . 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 Laymen's guide and forms provided.    Reviewed Aug '15      Exercsie:5 days a week, treadmill   Diet: healthy, weight watchers.         Attends [employer]    Family History:   Family History  Problem Relation Age of Onset  . Hypertension Mother   . Hyperlipidemia Mother    . Cancer Mother        breast/colon  . Heart disease Mother   . Diabetes Father   . Emphysema Father   . Hyperlipidemia Sister   . Hypertension Sister   . Hypothyroidism Sister      ROS:  Please see the history of present illness.   All other ROS reviewed and negative.     Physical Exam/Data:   Vitals:   01/25/19 5366 01/25/19 0430 01/25/19 0445  01/25/19 0500  BP: (!) 146/64 137/61 139/86 136/65  Pulse: 73 72 78 73  Resp: 13 19 (!) 25 15  Temp:      TempSrc:      SpO2: 99% 99% 99% 99%  Weight:      Height:       No intake or output data in the 24 hours ending 01/25/19 0547 Last 3 Weights 01/25/2019 01/20/2019 01/05/2019  Weight (lbs) 118 lb 121 lb 9.6 oz 121 lb  Weight (kg) 53.524 kg 55.157 kg 54.885 kg     Body mass index is 22.3 kg/m.  Patient not examined to minimize contact during COVID outbreak.  EKG:  The EKG was personally reviewed and demonstrates:  A. Fib with RVR   Relevant CV Studies: Echo 06/2014 LVEF 55-60%, normal wall thickness, moderate LAE, mild RAE, mild MV thickening with late systolic bileaflet prolapse, moderate MR, Stage 2 diastolic dysfunction with elevated LV filing pressure.  Laboratory Data:  Chemistry Recent Labs  Lab 01/25/19 0117  NA 134*  K 3.6  CL 100  CO2 23  GLUCOSE 126*  BUN 16  CREATININE 0.90  CALCIUM 9.6  GFRNONAA >60  GFRAA >60  ANIONGAP 11    No results for input(s): PROT, ALBUMIN, AST, ALT, ALKPHOS, BILITOT in the last 168 hours. Hematology Recent Labs  Lab 01/25/19 0117  WBC 7.9  RBC 4.82  HGB 14.8  HCT 43.3  MCV 89.8  MCH 30.7  MCHC 34.2  RDW 12.2  PLT 299   Cardiac Enzymes Recent Labs  Lab 01/25/19 0117  TROPONINI <0.03   No results for input(s): TROPIPOC in the last 168 hours.  BNPNo results for input(s): BNP, PROBNP in the last 168 hours.  DDimer No results for input(s): DDIMER in the last 168 hours.  Radiology/Studies:  Dg Chest 2 View  Result Date: 01/25/2019 CLINICAL DATA:   Irregular heartbeat. EXAM: CHEST - 2 VIEW COMPARISON:  01/04/2019 FINDINGS: The heart size and mediastinal contours are within normal limits. Both lungs are clear. The visualized skeletal structures are unremarkable. IMPRESSION: Clear lungs. Electronically Signed   By: Ulyses Jarred M.D.   On: 01/25/2019 02:00    Assessment and Plan:   [facility] is a 78 y.o. female with a hx of A. fib who is being seen today for the evaluation of recurrent A. Fib. Unfortunately, this is her 4th presentation in approximately one month with recurrent, symptomatic A. Fib. She is on very low dose flecainide at this point. Would favor repeat cardioversion while on flecainide and, if successful, increase flecainide to 100mg  BID.   - Repeat cardioversion in ER - If cardioversion fails, will admit to cardiology - Increase flecainide to 100mg  BID - Continue home eliquis and diltiazem    For questions or updates, please contact [place] Please consult [email or web address] for contact info under     Signed, Princella Pellegrini, MD  01/25/2019 5:47 AM

## 2019-01-25 NOTE — Sedation Documentation (Signed)
Pt up and drinking and talking normally.

## 2019-01-25 NOTE — Sedation Documentation (Signed)
Patient denies pain and is resting comfortably.  

## 2019-01-25 NOTE — ED Triage Notes (Signed)
Pt to ED with c/o palpations that woke her from sleep.  Pt was here on 4/21 for same and had cardioversion.  Pt denies any chest pain

## 2019-01-26 ENCOUNTER — Telehealth: Payer: Self-pay

## 2019-01-26 ENCOUNTER — Telehealth: Payer: Self-pay | Admitting: Internal Medicine

## 2019-01-26 NOTE — Telephone Encounter (Signed)
Spoke with pt regarding appt on 01/31/19. Pt stated she is not able to access MyChart and would like a phone visit. Pt was offered help,but declined. Pt was advise to check vitals prior to appt. Pt questions and concerns were address.

## 2019-01-26 NOTE — Telephone Encounter (Signed)
Pt called to let us know that her computer is not working anymore, and needed to change her video chat to a phone call.  Pt would also like to know if someone could tell her the direct number Dr. Rayann Heman will be calling from on Monday so she doesn't block the call

## 2019-01-31 ENCOUNTER — Encounter: Payer: Self-pay | Admitting: Internal Medicine

## 2019-01-31 ENCOUNTER — Other Ambulatory Visit: Payer: Self-pay

## 2019-01-31 ENCOUNTER — Telehealth (INDEPENDENT_AMBULATORY_CARE_PROVIDER_SITE_OTHER): Payer: Medicare HMO | Admitting: Internal Medicine

## 2019-01-31 ENCOUNTER — Telehealth: Payer: Self-pay

## 2019-01-31 VITALS — BP 137/67 | HR 62 | Ht 61.0 in | Wt 122.2 lb

## 2019-01-31 DIAGNOSIS — I10 Essential (primary) hypertension: Secondary | ICD-10-CM

## 2019-01-31 DIAGNOSIS — I480 Paroxysmal atrial fibrillation: Secondary | ICD-10-CM | POA: Diagnosis not present

## 2019-01-31 NOTE — Telephone Encounter (Signed)
-----   Message from Thompson Grayer, MD sent at 01/31/2019 10:28 AM EDT ----- Afib ablation, carto, ice, anesthesia TEE on the table

## 2019-01-31 NOTE — Telephone Encounter (Signed)
Placed call to Pt.  Pt scheduled for afib ablation with TEE prior to procedure on Feb 08, 2019 at 7:30 am.  Verbal instruction given:  Please arrive at the [facility] main entrance of [place] [facility] at:  5:30 am on Feb 08, 2019 Do not eat or drink after midnight prior to procedure Do not take any medications the morning of the procedure Plan for one night stay You will need someone to drive you home at discharge  Advised she would receive separate call to schedule covid testing.  All questions answered.

## 2019-01-31 NOTE — H&P (View-Only) (Signed)
Electrophysiology TeleHealth Note   Due to national recommendations of social distancing due to [place] 19, an audio telehealth visit is felt to be most appropriate for this patient at this time.  Verbal consent was obtained from the patient today.  She states that her smart phone is "not working" and she is unable to do a virtual visit today.   Date:  01/31/2019   ID:  Alexandria Mcdowell, DOB [birth year], MRN [id number]  Location: patient's home  Provider location: [address] [place]  Evaluation Performed: Follow-up visit  PCP:  Jinny Sanders, MD  Cardiologist:  Jenkins Rouge, MD Electrophysiologist:  Dr Rayann Heman  Chief Complaint:  afib  History of Present Illness:    Alexandria Mcdowell is a 78 y.o. female who presents via audio/video conferencing for a telehealth visit today.  Since last being seen in our clinic, the patient reports doing reasonably well. She was in the ED again 01/25/2019.  This was her fourth presentation in a month for recurrent afib.  Her flecainide was increased.  She is tolerating this without symptoms.  Today, she denies symptoms of palpitations, chest pain, shortness of breath,  lower extremity edema, dizziness, presyncope, or syncope.  The patient is otherwise without complaint today.  The patient denies symptoms of fevers, chills, cough, or new SOB worrisome for COVID 19.  Past Medical History:  Diagnosis Date  . Allergy   . Anemia   . Hyperlipidemia   . Hypertension   . Migraine   . Paroxysmal atrial fibrillation [facility])     Past Surgical History:  Procedure Laterality Date  . APPENDECTOMY    . BREAST BIOPSY    . CARDIOVERSION N/A 12/29/2018   Procedure: CARDIOVERSION;  Surgeon: Dorothy Spark, MD;  Location: [facility] ENDOSCOPY;  Service: Cardiovascular;  Laterality: N/A;  . CATARACT EXTRACTION  05/05/13   right eye   . COLONOSCOPY  2006  . [place]  . WISDOM TOOTH EXTRACTION      Current Outpatient  Medications  Medication Sig Dispense Refill  . alendronate (FOSAMAX) 35 MG tablet Take 35 mg by mouth every 7 (seven) days. Take with a full glass of water on an empty stomach.    Marland Kitchen apixaban (ELIQUIS) 5 MG TABS tablet Take 1 tablet (5 mg total) by mouth 2 (two) times daily for 30 days. 180 tablet 1  . atorvastatin (LIPITOR) 10 MG tablet Take 1 tablet (10 mg total) by mouth daily. 90 tablet 1  . diltiazem (CARDIZEM CD) 240 MG 24 hr capsule Take 1 capsule (240 mg total) by mouth daily. 90 capsule 3  . diltiazem (CARDIZEM) 30 MG tablet Take 1 tablet every 4 hours AS NEEDED for AFIB heart rate >100 45 tablet 1  . flecainide (TAMBOCOR) 50 MG tablet Take 1 tablet (50 mg total) by mouth 2 (two) times daily. 180 tablet 3  . lisinopril-hydrochlorothiazide (PRINZIDE,ZESTORETIC) 10-12.5 MG tablet Take 1 tablet by mouth daily. 90 tablet 3  . Multiple Vitamins-Minerals (MULTIVITAMIN WITH MINERALS) tablet Take 1 tablet by mouth daily.    . Omega-3 Fatty Acids (FISH OIL) 1200 MG CAPS Take 1 capsule by mouth daily.    . sodium chloride (OCEAN) 0.65 % SOLN nasal spray Place 1 spray into both nostrils as needed for congestion.     No current facility-administered medications for this visit.     Allergies:   Clindamycin hcl; Codeine; Erythromycin; Tetracycline; Penicillins; and Sulfonamide derivatives   Social History:  The patient  reports that she has never smoked. She has never used smokeless tobacco. She reports that she does not drink alcohol or use drugs.   Family History:  The patient's  family history includes Cancer in her mother; Diabetes in her father; Emphysema in her father; Heart disease in her mother; Hyperlipidemia in her mother and sister; Hypertension in her mother and sister; Hypothyroidism in her sister.   ROS:  Please see the history of present illness.   All other systems are personally reviewed and negative.    Exam:    Vital Signs:  BP 137/67   Pulse 62   Ht 5\' 1"  (1.549 m)   Wt  122 lb 3.2 oz (55.4 kg)   BMI 23.09 kg/m   Well sounding  Labs/Other Tests and Data Reviewed:    Recent Labs: 06/23/2018: ALT 28 12/28/2018: TSH 0.732 01/04/2019: Magnesium 1.8 01/25/2019: BUN 16; Creatinine, Ser 0.90; Hemoglobin 14.8; Platelets 299; Potassium 3.6; Sodium 134   Wt Readings from Last 3 Encounters:  01/31/19 122 lb 3.2 oz (55.4 kg)  01/25/19 118 lb (53.5 kg)  01/20/19 121 lb 9.6 oz (55.2 kg)     Other studies personally reviewed: Additional studies/ records that were reviewed today include: AF clinic notes, recent fellow consult Review of the above records today demonstrates: as above   ASSESSMENT & PLAN:    1.  Paroxysmal atrial fibrillation The patient has symptomatic, recurrent paroxysmal atrial fibrillation. she has failed medical therapy with flecainide Chads2vasc score is 4.  she is anticoagulated with eliquis . Therapeutic strategies for afib including medicine and ablation were discussed in detail with the patient today. Risk, benefits, and alternatives to EP study and radiofrequency ablation for afib were also discussed in detail today. These risks include but are not limited to stroke, bleeding, vascular damage, tamponade, perforation, damage to the esophagus, lungs, and other structures, pulmonary vein stenosis, worsening renal function, and death. The patient understands these risk and wishes to proceed.  We will therefore proceed with catheter ablation at the next available time.  Carto, ICE, anesthesia are requested for the procedure.  Will also obtain TEE prior to the procedure to exclude LAA thrombus and further evaluate atrial anatomy.  2. HTN Stable No change required today  3. COVID 19 screen The patient denies symptoms of COVID 19 at this time.  The importance of social distancing was discussed today.    Current medicines are reviewed at length with the patient today.   The patient does not have concerns regarding her medicines.  The following  changes were made today:  none  Labs/ tests ordered today include:  No orders of the defined types were placed in this encounter.  Patient Risk:  after full review of this patients clinical status, I feel that they are at moderate risk at this time.  Today, I have spent 25 minutes with the patient with telehealth technology discussing afib .    Army Fossa, MD  01/31/2019 10:00 AM     [facility] [address] [place] [address] [phone] (office) [phone] (fax)

## 2019-01-31 NOTE — Progress Notes (Signed)
Electrophysiology TeleHealth Note   Due to national recommendations of social distancing due to [place] 19, an audio telehealth visit is felt to be most appropriate for this patient at this time.  Verbal consent was obtained from the patient today.  She states that her smart phone is "not working" and she is unable to do a virtual visit today.   Date:  01/31/2019   ID:  Alexandria Mcdowell, DOB 1940/12/18, MRN [id number]  Location: patient's home  Provider location: [address], [place]  Evaluation Performed: Follow-up visit  PCP:  Jinny Sanders, MD  Cardiologist:  Jenkins Rouge, MD Electrophysiologist:  Dr Rayann Heman  Chief Complaint:  afib  History of Present Illness:    Alexandria Mcdowell is a 78 y.o. female who presents via audio/video conferencing for a telehealth visit today.  Since last being seen in our clinic, the patient reports doing reasonably well. She was in the ED again 01/25/2019.  This was her fourth presentation in a month for recurrent afib.  Her flecainide was increased.  She is tolerating this without symptoms.  Today, she denies symptoms of palpitations, chest pain, shortness of breath,  lower extremity edema, dizziness, presyncope, or syncope.  The patient is otherwise without complaint today.  The patient denies symptoms of fevers, chills, cough, or new SOB worrisome for COVID 19.  Past Medical History:  Diagnosis Date  . Allergy   . Anemia   . Hyperlipidemia   . Hypertension   . Migraine   . Paroxysmal atrial fibrillation [facility])     Past Surgical History:  Procedure Laterality Date  . APPENDECTOMY    . BREAST BIOPSY    . CARDIOVERSION N/A 12/29/2018   Procedure: CARDIOVERSION;  Surgeon: Dorothy Spark, MD;  Location[facility] ENDOSCOPY;  Service: Cardiovascular;  Laterality: N/A;  . CATARACT EXTRACTION  05/05/13   right eye   . COLONOSCOPY  2006  . [place]  . WISDOM TOOTH EXTRACTION      Current Outpatient  Medications  Medication Sig Dispense Refill  . alendronate (FOSAMAX) 35 MG tablet Take 35 mg by mouth every 7 (seven) days. Take with a full glass of water on an empty stomach.    Marland Kitchen apixaban (ELIQUIS) 5 MG TABS tablet Take 1 tablet (5 mg total) by mouth 2 (two) times daily for 30 days. 180 tablet 1  . atorvastatin (LIPITOR) 10 MG tablet Take 1 tablet (10 mg total) by mouth daily. 90 tablet 1  . diltiazem (CARDIZEM CD) 240 MG 24 hr capsule Take 1 capsule (240 mg total) by mouth daily. 90 capsule 3  . diltiazem (CARDIZEM) 30 MG tablet Take 1 tablet every 4 hours AS NEEDED for AFIB heart rate >100 45 tablet 1  . flecainide (TAMBOCOR) 50 MG tablet Take 1 tablet (50 mg total) by mouth 2 (two) times daily. 180 tablet 3  . lisinopril-hydrochlorothiazide (PRINZIDE,ZESTORETIC) 10-12.5 MG tablet Take 1 tablet by mouth daily. 90 tablet 3  . Multiple Vitamins-Minerals (MULTIVITAMIN WITH MINERALS) tablet Take 1 tablet by mouth daily.    . Omega-3 Fatty Acids (FISH OIL) 1200 MG CAPS Take 1 capsule by mouth daily.    . sodium chloride (OCEAN) 0.65 % SOLN nasal spray Place 1 spray into both nostrils as needed for congestion.     No current facility-administered medications for this visit.     Allergies:   Clindamycin hcl; Codeine; Erythromycin; Tetracycline; Penicillins; and Sulfonamide derivatives   Social History:  The patient  reports that she has never smoked. She has never used smokeless tobacco. She reports that she does not drink alcohol or use drugs.   Family History:  The patient's  family history includes Cancer in her mother; Diabetes in her father; Emphysema in her father; Heart disease in her mother; Hyperlipidemia in her mother and sister; Hypertension in her mother and sister; Hypothyroidism in her sister.   ROS:  Please see the history of present illness.   All other systems are personally reviewed and negative.    Exam:    Vital Signs:  BP 137/67   Pulse 62   Ht 5\' 1"  (1.549 m)   Wt  122 lb 3.2 oz (55.4 kg)   BMI 23.09 kg/m   Well sounding  Labs/Other Tests and Data Reviewed:    Recent Labs: 06/23/2018: ALT 28 12/28/2018: TSH 0.732 01/04/2019: Magnesium 1.8 01/25/2019: BUN 16; Creatinine, Ser 0.90; Hemoglobin 14.8; Platelets 299; Potassium 3.6; Sodium 134   Wt Readings from Last 3 Encounters:  01/31/19 122 lb 3.2 oz (55.4 kg)  01/25/19 118 lb (53.5 kg)  01/20/19 121 lb 9.6 oz (55.2 kg)     Other studies personally reviewed: Additional studies/ records that were reviewed today include: AF clinic notes, recent fellow consult Review of the above records today demonstrates: as above   ASSESSMENT & PLAN:    1.  Paroxysmal atrial fibrillation The patient has symptomatic, recurrent paroxysmal atrial fibrillation. she has failed medical therapy with flecainide Chads2vasc score is 4.  she is anticoagulated with eliquis . Therapeutic strategies for afib including medicine and ablation were discussed in detail with the patient today. Risk, benefits, and alternatives to EP study and radiofrequency ablation for afib were also discussed in detail today. These risks include but are not limited to stroke, bleeding, vascular damage, tamponade, perforation, damage to the esophagus, lungs, and other structures, pulmonary vein stenosis, worsening renal function, and death. The patient understands these risk and wishes to proceed.  We will therefore proceed with catheter ablation at the next available time.  Carto, ICE, anesthesia are requested for the procedure.  Will also obtain TEE prior to the procedure to exclude LAA thrombus and further evaluate atrial anatomy.  2. HTN Stable No change required today  3. COVID 19 screen The patient denies symptoms of COVID 19 at this time.  The importance of social distancing was discussed today.    Current medicines are reviewed at length with the patient today.   The patient does not have concerns regarding her medicines.  The following  changes were made today:  none  Labs/ tests ordered today include:  No orders of the defined types were placed in this encounter.  Patient Risk:  after full review of this patients clinical status, I feel that they are at moderate risk at this time.  Today, I have spent 25 minutes with the patient with telehealth technology discussing afib .    Army Fossa, MD  01/31/2019 10:00 AM     [facility] [address] [place] [address] [phone] (office) [phone] (fax)

## 2019-02-02 ENCOUNTER — Telehealth: Payer: Self-pay | Admitting: *Deleted

## 2019-02-02 NOTE — Telephone Encounter (Signed)
APPT 5/22 @ 9:20am         COVID-19 Pre-Screening Questions:  . In the past 7 to 10 days have you had a cough,  shortness of breath, headache, congestion, fever, body aches, chills, sore throat, or sudden loss of taste or sense of smell?  NO . Have you been around anyone with known Covid 19.  NO . Have you been around anyone who is awaiting Covid 19 test results in the past 7 to 10 days?  NO . Have you been around anyone who has been exposed to Covid 19, or has mentioned symptoms of Covid 19 within the past 7 to 10 days?  NO  If you have any concerns/questions about symptoms patients report during screening (either on the phone or at threshold). Contact the provider seeing the patient or DOD for further guidance.  If neither are available contact a member of the leadership team.

## 2019-02-04 ENCOUNTER — Other Ambulatory Visit (HOSPITAL_COMMUNITY)
Admission: RE | Admit: 2019-02-04 | Discharge: 2019-02-04 | Disposition: A | Discharge status: Home / Self Care | Payer: Medicare HMO | Source: Ambulatory Visit | Attending: Orthopedic Surgery | Admitting: Orthopedic Surgery

## 2019-02-04 DIAGNOSIS — Z1159 Encounter for screening for other viral diseases: Secondary | ICD-10-CM | POA: Insufficient documentation

## 2019-02-05 LAB — NOVEL CORONAVIRUS, NAA (HOSP ORDER, SEND-OUT TO REF LAB; TAT 18-24 HRS): SARS-CoV-2, NAA: NOT DETECTED

## 2019-02-08 ENCOUNTER — Encounter (HOSPITAL_COMMUNITY)
Admission: RE | Disposition: A | Discharge status: Home / Self Care | Payer: Self-pay | Source: Home / Self Care | Attending: Internal Medicine

## 2019-02-08 ENCOUNTER — Ambulatory Visit (HOSPITAL_COMMUNITY): Payer: Medicare HMO | Admitting: Certified Registered"

## 2019-02-08 ENCOUNTER — Ambulatory Visit (HOSPITAL_COMMUNITY)
Admission: RE | Admit: 2019-02-08 | Discharge: 2019-02-08 | Disposition: A | Discharge status: Home / Self Care | Payer: Medicare HMO | Attending: Internal Medicine | Admitting: Internal Medicine

## 2019-02-08 ENCOUNTER — Encounter (HOSPITAL_COMMUNITY): Payer: Self-pay | Admitting: Certified Registered"

## 2019-02-08 DIAGNOSIS — Z885 Allergy status to narcotic agent status: Secondary | ICD-10-CM | POA: Diagnosis not present

## 2019-02-08 DIAGNOSIS — Z7901 Long term (current) use of anticoagulants: Secondary | ICD-10-CM | POA: Insufficient documentation

## 2019-02-08 DIAGNOSIS — E785 Hyperlipidemia, unspecified: Secondary | ICD-10-CM | POA: Insufficient documentation

## 2019-02-08 DIAGNOSIS — I10 Essential (primary) hypertension: Secondary | ICD-10-CM | POA: Diagnosis not present

## 2019-02-08 DIAGNOSIS — Z79899 Other long term (current) drug therapy: Secondary | ICD-10-CM | POA: Diagnosis not present

## 2019-02-08 DIAGNOSIS — Z882 Allergy status to sulfonamides status: Secondary | ICD-10-CM | POA: Diagnosis not present

## 2019-02-08 DIAGNOSIS — Z880 Allergy status to penicillin: Secondary | ICD-10-CM | POA: Insufficient documentation

## 2019-02-08 DIAGNOSIS — I480 Paroxysmal atrial fibrillation: Secondary | ICD-10-CM | POA: Insufficient documentation

## 2019-02-08 DIAGNOSIS — I4891 Unspecified atrial fibrillation: Secondary | ICD-10-CM | POA: Diagnosis not present

## 2019-02-08 DIAGNOSIS — Z881 Allergy status to other antibiotic agents status: Secondary | ICD-10-CM | POA: Insufficient documentation

## 2019-02-08 DIAGNOSIS — Z8249 Family history of ischemic heart disease and other diseases of the circulatory system: Secondary | ICD-10-CM | POA: Insufficient documentation

## 2019-02-08 HISTORY — PX: ATRIAL FIBRILLATION ABLATION: EP1191

## 2019-02-08 LAB — POCT ACTIVATED CLOTTING TIME
Activated Clotting Time: 384 seconds
Activated Clotting Time: 389 seconds

## 2019-02-08 SURGERY — ATRIAL FIBRILLATION ABLATION
Anesthesia: General

## 2019-02-08 MED ORDER — PANTOPRAZOLE SODIUM 40 MG PO TBEC: 40.0000 mg | DELAYED_RELEASE_TABLET | Freq: Every day | ORAL | 0 refills | Status: DC

## 2019-02-08 MED ORDER — HEPARIN (PORCINE) IN NACL 1000-0.9 UT/500ML-% IV SOLN
INTRAVENOUS | Status: DC | PRN
  Administered 2019-02-08: 500 mL

## 2019-02-08 MED ORDER — ACETAMINOPHEN 10 MG/ML IV SOLN: 1000.0000 mg | Freq: Once | INTRAVENOUS | Status: DC | PRN

## 2019-02-08 MED ORDER — SUGAMMADEX SODIUM 200 MG/2ML IV SOLN
INTRAVENOUS | Status: DC | PRN
  Administered 2019-02-08: 100 mg via INTRAVENOUS

## 2019-02-08 MED ORDER — APIXABAN 5 MG PO TABS
5.0000 mg | ORAL_TABLET | Freq: Once | ORAL | Status: AC
  Administered 2019-02-08: 5 mg via ORAL
  Filled 2019-02-08: qty 1

## 2019-02-08 MED ORDER — HEPARIN SODIUM (PORCINE) 1000 UNIT/ML IJ SOLN
INTRAMUSCULAR | Status: DC | PRN
  Administered 2019-02-08: 12000 [IU] via INTRAVENOUS
  Administered 2019-02-08: 1000 [IU] via INTRAVENOUS

## 2019-02-08 MED ORDER — LIDOCAINE 2% (20 MG/ML) 5 ML SYRINGE
INTRAMUSCULAR | Status: DC | PRN
  Administered 2019-02-08: 60 mg via INTRAVENOUS

## 2019-02-08 MED ORDER — HEPARIN (PORCINE) IN NACL 1000-0.9 UT/500ML-% IV SOLN
INTRAVENOUS | Status: AC
  Filled 2019-02-08: qty 500

## 2019-02-08 MED ORDER — SODIUM CHLORIDE 0.9 % IV SOLN
INTRAVENOUS | Status: DC | PRN
  Administered 2019-02-08: 25 ug/min via INTRAVENOUS

## 2019-02-08 MED ORDER — SUCCINYLCHOLINE CHLORIDE 200 MG/10ML IV SOSY
PREFILLED_SYRINGE | INTRAVENOUS | Status: DC | PRN
  Administered 2019-02-08: 60 mg via INTRAVENOUS

## 2019-02-08 MED ORDER — HEPARIN SODIUM (PORCINE) 1000 UNIT/ML IJ SOLN
INTRAMUSCULAR | Status: AC
  Filled 2019-02-08: qty 2

## 2019-02-08 MED ORDER — PHENYLEPHRINE 40 MCG/ML (10ML) SYRINGE FOR IV PUSH (FOR BLOOD PRESSURE SUPPORT)
PREFILLED_SYRINGE | INTRAVENOUS | Status: DC | PRN
  Administered 2019-02-08: 120 ug via INTRAVENOUS

## 2019-02-08 MED ORDER — DEXAMETHASONE SODIUM PHOSPHATE 10 MG/ML IJ SOLN
INTRAMUSCULAR | Status: DC | PRN
  Administered 2019-02-08: 5 mg via INTRAVENOUS

## 2019-02-08 MED ORDER — ACETAMINOPHEN 325 MG PO TABS: 650.0000 mg | ORAL_TABLET | ORAL | Status: DC | PRN

## 2019-02-08 MED ORDER — ISOPROTERENOL HCL 0.2 MG/ML IJ SOLN
INTRAMUSCULAR | Status: AC
  Filled 2019-02-08: qty 5

## 2019-02-08 MED ORDER — ROCURONIUM BROMIDE 10 MG/ML (PF) SYRINGE
PREFILLED_SYRINGE | INTRAVENOUS | Status: DC | PRN
  Administered 2019-02-08: 50 mg via INTRAVENOUS

## 2019-02-08 MED ORDER — FENTANYL CITRATE (PF) 250 MCG/5ML IJ SOLN
INTRAMUSCULAR | Status: DC | PRN
  Administered 2019-02-08: 75 ug via INTRAVENOUS

## 2019-02-08 MED ORDER — SODIUM CHLORIDE 0.9% FLUSH: 3.0000 mL | Freq: Two times a day (BID) | INTRAVENOUS | Status: DC

## 2019-02-08 MED ORDER — ISOPROTERENOL HCL 0.2 MG/ML IJ SOLN
INTRAVENOUS | Status: DC | PRN
  Administered 2019-02-08: 20 ug/min via INTRAVENOUS

## 2019-02-08 MED ORDER — FENTANYL CITRATE (PF) 100 MCG/2ML IJ SOLN: 25.0000 ug | INTRAMUSCULAR | Status: DC | PRN

## 2019-02-08 MED ORDER — BUPIVACAINE HCL (PF) 0.25 % IJ SOLN
INTRAMUSCULAR | Status: AC
  Filled 2019-02-08: qty 60

## 2019-02-08 MED ORDER — ONDANSETRON HCL 4 MG/2ML IJ SOLN: 4.0000 mg | Freq: Once | INTRAMUSCULAR | Status: DC | PRN

## 2019-02-08 MED ORDER — ONDANSETRON HCL 4 MG/2ML IJ SOLN: 4.0000 mg | Freq: Four times a day (QID) | INTRAMUSCULAR | Status: DC | PRN

## 2019-02-08 MED ORDER — SODIUM CHLORIDE 0.9 % IV SOLN
INTRAVENOUS | Status: DC
  Administered 2019-02-08: 06:00:00 via INTRAVENOUS

## 2019-02-08 MED ORDER — ONDANSETRON HCL 4 MG/2ML IJ SOLN
INTRAMUSCULAR | Status: DC | PRN
  Administered 2019-02-08: 4 mg via INTRAVENOUS

## 2019-02-08 MED ORDER — PROPOFOL 10 MG/ML IV BOLUS
INTRAVENOUS | Status: DC | PRN
  Administered 2019-02-08: 100 mg via INTRAVENOUS
  Administered 2019-02-08: 50 mg via INTRAVENOUS

## 2019-02-08 MED ORDER — HYDROCODONE-ACETAMINOPHEN 5-325 MG PO TABS: 1.0000 | ORAL_TABLET | ORAL | Status: DC | PRN

## 2019-02-08 MED ORDER — BUPIVACAINE HCL (PF) 0.25 % IJ SOLN
INTRAMUSCULAR | Status: DC | PRN
  Administered 2019-02-08: 20 mL

## 2019-02-08 MED ORDER — SODIUM CHLORIDE 0.9 % IV SOLN: 250.0000 mL | INTRAVENOUS | Status: DC | PRN

## 2019-02-08 MED ORDER — SODIUM CHLORIDE 0.9% FLUSH: 3.0000 mL | INTRAVENOUS | Status: DC | PRN

## 2019-02-08 MED ORDER — PROTAMINE SULFATE 10 MG/ML IV SOLN
INTRAVENOUS | Status: DC | PRN
  Administered 2019-02-08: 40 mg via INTRAVENOUS

## 2019-02-08 MED ORDER — KETOROLAC TROMETHAMINE 30 MG/ML IJ SOLN
15.0000 mg | Freq: Once | INTRAMUSCULAR | Status: AC
  Administered 2019-02-08: 15 mg via INTRAVENOUS
  Filled 2019-02-08: qty 1

## 2019-02-08 MED ORDER — HEPARIN SODIUM (PORCINE) 1000 UNIT/ML IJ SOLN
INTRAMUSCULAR | Status: DC | PRN
  Administered 2019-02-08 (×2): 2000 [IU] via INTRAVENOUS

## 2019-02-08 SURGICAL SUPPLY — 18 items
BLANKET WARM UNDERBOD FULL ACC (MISCELLANEOUS) ×2 IMPLANT
CATH MAPPNG PENTARAY F 2-6-2MM (CATHETERS) IMPLANT
CATH NAVISTAR SMARTTOUCH DF (ABLATOR) ×2 IMPLANT
CATH SOUNDSTAR 3D IMAGING (CATHETERS) ×2 IMPLANT
CATH SOUNDSTAR ECO REPROCESSED (CATHETERS) IMPLANT
CATH WEB BIDIR CS D-F NONAUTO (CATHETERS) ×2 IMPLANT
COVER SWIFTLINK CONNECTOR (BAG) ×2 IMPLANT
NDL BAYLIS TRANSSEPTAL 71CM (NEEDLE) IMPLANT
NEEDLE BAYLIS TRANSSEPTAL 71CM (NEEDLE) ×3 IMPLANT
PACK EP LATEX FREE (CUSTOM PROCEDURE TRAY) ×3
PACK EP LF (CUSTOM PROCEDURE TRAY) ×1 IMPLANT
PAD PRO RADIOLUCENT 2001M-C (PAD) ×3 IMPLANT
PENTARAY F 2-6-2MM (CATHETERS) ×3
SHEATH AVANTI 11F 11CM (SHEATH) ×2 IMPLANT
SHEATH PINNACLE 7F 10CM (SHEATH) ×4 IMPLANT
SHEATH PINNACLE 9F 10CM (SHEATH) ×2 IMPLANT
SHEATH SWARTZ TS SL2 63CM 8.5F (SHEATH) ×2 IMPLANT
TUBING SMART ABLATE COOLFLOW (TUBING) ×2 IMPLANT

## 2019-02-08 NOTE — Anesthesia Procedure Notes (Signed)
Procedure Name: Intubation Date/Time: 02/08/2019 7:54 AM Performed by: Imagene Riches, CRNA Pre-anesthesia Checklist: Patient identified, Emergency Drugs available, Suction available and Patient being monitored Patient Re-evaluated:Patient Re-evaluated prior to induction Oxygen Delivery Method: Circle System Utilized Preoxygenation: Pre-oxygenation with 100% oxygen Induction Type: IV induction Laryngoscope Size: Glidescope and 3 Grade View: Grade I Tube type: Oral Tube size: 7.0 mm Number of attempts: 1 Airway Equipment and Method: Stylet and Oral airway Placement Confirmation: ETT inserted through vocal cords under direct vision,  positive ETCO2 and breath sounds checked- equal and bilateral Secured at: 20 cm Tube secured with: Tape Dental Injury: Teeth and Oropharynx as per pre-operative assessment  Difficulty Due To: Difficulty was anticipated, Difficult Airway- due to reduced neck mobility and Difficult Airway- due to anterior larynx Comments: Mallampati 4, short TMD, poor neck extension. DL x1 with Miller 2, Grade IV view (epiglottis not visible). No attempt to pass ETT. Second DL with Glide Go, 3 blade, grade 1 view, easily passed ETT. VSS throughout.

## 2019-02-08 NOTE — Progress Notes (Signed)
Rt groin hematoma mostly resolved.

## 2019-02-08 NOTE — Anesthesia Postprocedure Evaluation (Signed)
Anesthesia Post Note  Patient: Alexandria Mcdowell  Procedure(s) Performed: ATRIAL FIBRILLATION ABLATION (N/A )     Patient location during evaluation: Cath Lab Anesthesia Type: General Level of consciousness: awake and alert Pain management: pain level controlled Vital Signs Assessment: post-procedure vital signs reviewed and stable Respiratory status: spontaneous breathing, nonlabored ventilation, respiratory function stable and patient connected to nasal cannula oxygen Cardiovascular status: blood pressure returned to baseline and stable Postop Assessment: no apparent nausea or vomiting Anesthetic complications: no    Last Vitals:  Vitals:   02/08/19 1215 02/08/19 1230  BP:  (!) 131/55  Pulse: 88 93  Resp: (!) 21 19  Temp:    SpO2: 97% 99%    Last Pain:  Vitals:   02/08/19 1137  TempSrc: Oral  PainSc: 0-No pain                 Ryan P Ellender

## 2019-02-08 NOTE — Progress Notes (Addendum)
Hematoma resolved. Site level 0. 3 way stop cock removed then figure 8 stitch removed. Small ozzing, manual pressure held for 10 minutes. No further bleeding. Gauze and tegaderm applied. Instructions reviewed w/patient. IV saline locked

## 2019-02-08 NOTE — Anesthesia Preprocedure Evaluation (Addendum)
Anesthesia Evaluation  Patient identified by MRN, date of birth, ID band Patient awake    Reviewed: Allergy & Precautions, NPO status , Patient's Chart, lab work & pertinent test results  Airway Mallampati: IV  TM Distance: >3 FB Neck ROM: Full    Dental no notable dental hx.    Pulmonary neg pulmonary ROS,    Pulmonary exam normal breath sounds clear to auscultation       Cardiovascular hypertension, Pt. on medications Normal cardiovascular exam+ dysrhythmias Atrial Fibrillation  Rhythm:Regular Rate:Normal  ECG: NSR, rate 75   Neuro/Psych  Headaches, negative psych ROS   GI/Hepatic negative GI ROS, Neg liver ROS,   Endo/Other  negative endocrine ROS  Renal/GU negative Renal ROS     Musculoskeletal negative musculoskeletal ROS (+)   Abdominal   Peds  Hematology  (+) anemia , HLD   Anesthesia Other Findings A-fib  Reproductive/Obstetrics                            Anesthesia Physical Anesthesia Plan  ASA: III  Anesthesia Plan: General   Post-op Pain Management:    Induction: Intravenous  PONV Risk Score and Plan: 3 and Dexamethasone, Ondansetron and Treatment may vary due to age or medical condition  Airway Management Planned: Oral ETT  Additional Equipment:   Intra-op Plan:   Post-operative Plan: Extubation in OR  Informed Consent: I have reviewed the patients History and Physical, chart, labs and discussed the procedure including the risks, benefits and alternatives for the proposed anesthesia with the patient or authorized representative who has indicated his/her understanding and acceptance.     Dental advisory given  Plan Discussed with: CRNA  Anesthesia Plan Comments:         Anesthesia Quick Evaluation

## 2019-02-08 NOTE — Progress Notes (Signed)
Called husband and informed him that pt is on her way to the cath lab now.

## 2019-02-08 NOTE — Interval H&P Note (Signed)
History and Physical Interval Note:  02/08/2019 7:28 AM  Alexandria Mcdowell  has presented today for surgery, with the diagnosis of afib.  The various methods of treatment have been discussed with the patient and family. After consideration of risks, benefits and other options for treatment, the patient has consented to  Procedure(s): ATRIAL FIBRILLATION ABLATION (N/A) as a surgical intervention.  The patient's history has been reviewed, patient examined, no change in status, stable for surgery.  I have reviewed the patient's chart and labs.  Questions were answered to the patient's satisfaction.     Thompson Grayer

## 2019-02-08 NOTE — Progress Notes (Signed)
Dr. Rayann Heman in to look at rt groin hematoma and tightened stopcock.

## 2019-02-08 NOTE — Transfer of Care (Signed)
Immediate Anesthesia Transfer of Care Note  Patient: Alexandria Mcdowell  Procedure(s) Performed: ATRIAL FIBRILLATION ABLATION (N/A )  Patient Location: PACU  Anesthesia Type:General  Level of Consciousness: awake, alert  and oriented  Airway & Oxygen Therapy: Patient Spontanous Breathing and Patient connected to nasal cannula oxygen  Post-op Assessment: Report given to RN and Post -op Vital signs reviewed and stable  Post vital signs: Reviewed and stable  Last Vitals:  Vitals Value Taken Time  BP 131/49 02/08/2019 10:26 AM  Temp 36.7 C 02/08/2019 10:16 AM  Pulse 79 02/08/2019 10:26 AM  Resp 18 02/08/2019 10:26 AM  SpO2 97 % 02/08/2019 10:26 AM  Vitals shown include unvalidated device data.  Last Pain:  Vitals:   02/08/19 1016  TempSrc: Temporal  PainSc: 0-No pain         Complications: No apparent anesthesia complications

## 2019-02-08 NOTE — Discharge Instructions (Signed)
Post procedure care instructions No driving for 4 days. No lifting over 5 lbs for 1 week. No vigorous or sexual activity for 1 week. You may return to work on 02/16/2019. Keep procedure site clean & dry. If you notice increased pain, swelling, bleeding or pus, call/return!  You may shower, but no soaking baths/hot tubs/pools for 1 week.     Femoral Site Care This sheet gives you information about how to care for yourself after your procedure. Your health care provider may also give you more specific instructions. If you have problems or questions, contact your health care provider. What can I expect after the procedure? After the procedure, it is common to have:  Bruising that usually fades within 1-2 weeks.  Tenderness at the site. Follow these instructions at home: Wound care  Follow instructions from your health care provider about how to take care of your insertion site. Make sure you: ? Wash your hands with soap and water before you change your bandage (dressing). If soap and water are not available, use hand sanitizer. ? Change your dressing as told by your health care provider. ? Leave stitches (sutures), skin glue, or adhesive strips in place. These skin closures may need to stay in place for 2 weeks or longer. If adhesive strip edges start to loosen and curl up, you may trim the loose edges. Do not remove adhesive strips completely unless your health care provider tells you to do that.  Do not take baths, swim, or use a hot tub until your health care provider approves.  You may shower 24-48 hours after the procedure or as told by your health care provider. ? Gently wash the site with plain soap and water. ? Pat the area dry with a clean towel. ? Do not rub the site. This may cause bleeding.  Do not apply powder or lotion to the site. Keep the site clean and dry.  Check your femoral site every day for signs of infection. Check for: ? Redness, swelling, or pain. ? Fluid or  blood. ? Warmth. ? Pus or a bad smell. Activity  For the first 2-3 days after your procedure, or as long as directed: ? Avoid climbing stairs as much as possible. ? Do not squat.  Do not lift anything that is heavier than 10 lb (4.5 kg), or the limit that you are told, until your health care provider says that it is safe.  Rest as directed. ? Avoid sitting for a long time without moving. Get up to take short walks every 1-2 hours.  Do not drive for 24 hours if you were given a medicine to help you relax (sedative). General instructions  Take over-the-counter and prescription medicines only as told by your health care provider.  Keep all follow-up visits as told by your health care provider. This is important. Contact a health care provider if you have:  A fever or chills.  You have redness, swelling, or pain around your insertion site. Get help right away if:  The catheter insertion area swells very fast.  You pass out.  You suddenly start to sweat or your skin gets clammy.  The catheter insertion area is bleeding, and the bleeding does not stop when you hold steady pressure on the area.  The area near or just beyond the catheter insertion site becomes pale, cool, tingly, or numb. These symptoms may represent a serious problem that is an emergency. Do not wait to see if the symptoms will go away.  Get medical help right away. Call your local emergency services (911 in the [place] Do not drive yourself to the hospital. Summary  After the procedure, it is common to have bruising that usually fades within 1-2 weeks.  Check your femoral site every day for signs of infection.  Do not lift anything that is heavier than 10 lb (4.5 kg), or the limit that you are told, until your health care provider says that it is safe. This information is not intended to replace advice given to you by your health care provider. Make sure you discuss any questions you have with your health care  provider. Document Released: 05/05/2014 Document Revised: 09/14/2017 Document Reviewed: 09/14/2017 Elsevier Interactive Patient Education  2019 [employer].

## 2019-02-09 ENCOUNTER — Encounter (HOSPITAL_COMMUNITY): Payer: Self-pay | Admitting: Internal Medicine

## 2019-03-11 ENCOUNTER — Ambulatory Visit (HOSPITAL_COMMUNITY)
Admission: RE | Admit: 2019-03-11 | Discharge: 2019-03-11 | Disposition: A | Discharge status: Home / Self Care | Payer: Medicare HMO | Source: Ambulatory Visit | Attending: Physician Assistant | Admitting: Physician Assistant

## 2019-03-11 ENCOUNTER — Other Ambulatory Visit: Payer: Self-pay

## 2019-03-11 DIAGNOSIS — I480 Paroxysmal atrial fibrillation: Secondary | ICD-10-CM

## 2019-03-11 NOTE — Progress Notes (Signed)
Electrophysiology TeleHealth Note   Due to national recommendations of social distancing due to [place] 19, Audio telehealth visit is felt to be most appropriate for this patient at this time.  See consent below from today for patient consent regarding telehealth for the At[facility] Fibrillation Clinic.    Date:  03/11/2019   ID:  Alexandria Mcdowell, DOB 1941/02/17, MRN [id number]  Location: home  Provider location: [address] [place], [place] [address] Evaluation Performed: Follow up  PCP:  Jinny Sanders, MD  Primary Cardiologist:  Dr Johnsie Cancel Primary Electrophysiologist: Dr Rayann Heman   CC: Follow up for atrial fibrillation    History of Present Illness: Alexandria Mcdowell is a 78 y.o. female who presents via audio/video conferencing for a telehealth visit today. Patient is s/p ablation with Dr Rayann Heman 02/08/19. She reports that she is doing very well and is very satisfied with her procedure. She is grateful for the care provided by the cath lab team. She has had no heart racing or palpitations. She denies any CP, swallowing, or groin issues.  Today, she denies symptoms of palpitations, chest pain, shortness of breath, orthopnea, PND, lower extremity edema, claudication, dizziness, presyncope, syncope, bleeding, or neurologic sequela. The patient is tolerating medications without difficulties and is otherwise without complaint today.   she denies symptoms of cough, fevers, chills, or new SOB worrisome for COVID 19.     Atrial Fibrillation Risk Factors:  she does not have symptoms or diagnosis of sleep apnea. she does not have a history of rheumatic fever. she does not have a history of alcohol use. The patient does not have a history of early familial atrial fibrillation or other arrhythmias.  she has a BMI of There is no height or weight on file to calculate BMI..  BP 122/69 Pulse 73 Provided by pt home BP machine  Past Medical History:  Diagnosis Date  . Allergy   . Anemia   .  Hyperlipidemia   . Hypertension   . Migraine   . Paroxysmal atrial fibrillation [facility])    Past Surgical History:  Procedure Laterality Date  . APPENDECTOMY    . ATRIAL FIBRILLATION ABLATION N/A 02/08/2019   Procedure: ATRIAL FIBRILLATION ABLATION;  Surgeon: Thompson Grayer, MD;  Location: [place] CV LAB;  Service: Cardiovascular;  Laterality: N/A;  . BREAST BIOPSY    . CARDIOVERSION N/A 12/29/2018   Procedure: CARDIOVERSION;  Surgeon: Dorothy Spark, MD;  Location: [facility] ENDOSCOPY;  Service: Cardiovascular;  Laterality: N/A;  . CATARACT EXTRACTION  05/05/13   right eye   . COLONOSCOPY  2006  . [place]  . WISDOM TOOTH EXTRACTION       Current Outpatient Medications  Medication Sig Dispense Refill  . alendronate (FOSAMAX) 35 MG tablet Take 35 mg by mouth every Tuesday. Take with a full glass of water on an empty stomach.     Marland Kitchen apixaban (ELIQUIS) 5 MG TABS tablet Take 1 tablet (5 mg total) by mouth 2 (two) times daily for 30 days. 180 tablet 1  . atorvastatin (LIPITOR) 10 MG tablet Take 1 tablet (10 mg total) by mouth daily. (Patient taking differently: Take 10 mg by mouth daily with supper. ) 90 tablet 1  . diltiazem (CARDIZEM CD) 240 MG 24 hr capsule Take 1 capsule (240 mg total) by mouth daily. 90 capsule 3  . diltiazem (CARDIZEM) 30 MG tablet Take 1 tablet every 4 hours AS NEEDED for AFIB heart rate >100 (Patient taking  differently: Take 30 mg by mouth every 4 (four) hours as needed (AFIB heart rate >100). ) 45 tablet 1  . flecainide (TAMBOCOR) 50 MG tablet Take 1 tablet (50 mg total) by mouth 2 (two) times daily. 180 tablet 3  . lisinopril-hydrochlorothiazide (PRINZIDE,ZESTORETIC) 10-12.5 MG tablet Take 1 tablet by mouth daily. 90 tablet 3  . Multiple Vitamins-Minerals (MULTIVITAMIN WITH MINERALS) tablet Take 1 tablet by mouth daily with supper.     . Omega-3 Fatty Acids (FISH OIL) 1200 MG CAPS Take 1,200 mg by mouth daily with supper.     . pantoprazole  (PROTONIX) 40 MG tablet Take 1 tablet (40 mg total) by mouth daily. 45 tablet 0  . Polyethyl Glycol-Propyl Glycol (LUBRICANT EYE DROPS) 0.4-0.3 % SOLN Place 1 drop into both eyes 3 (three) times daily as needed (for dry/irritated eyes.).    Marland Kitchen sodium chloride (OCEAN) 0.65 % SOLN nasal spray Place 1 spray into both nostrils 4 (four) times daily as needed for congestion (allergies.).      No current facility-administered medications for this encounter.     Allergies:   Clindamycin hcl, Codeine, Erythromycin, Tetracycline, Penicillins, and Sulfonamide derivatives   Social History:  The patient  reports that she has never smoked. She has never used smokeless tobacco. She reports that she does not drink alcohol or use drugs.   Family History:  The patient's  family history includes Cancer in her mother; Diabetes in her father; Emphysema in her father; Heart disease in her mother; Hyperlipidemia in her mother and sister; Hypertension in her mother and sister; Hypothyroidism in her sister.    ROS:  Please see the history of present illness.   All other systems are personally reviewed and negative.    Recent Labs: 06/23/2018: ALT 28 12/28/2018: TSH 0.732 01/04/2019: Magnesium 1.8 01/25/2019: BUN 16; Creatinine, Ser 0.90; Hemoglobin 14.8; Platelets 299; Potassium 3.6; Sodium 134  personally reviewed    Other studies personally reviewed: Additional studies/ records that were reviewed today include: Epic notes   Echo 06/21/2014 demonstrated  - Left ventricle: The cavity size was normal. Wall thickness was normal. Systolic function was normal. The estimated ejection fraction was in the range of 55% to 60%. Wall motion was normal; there were no regional wall motion abnormalities. 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 E/E&' ratio is >15, suggesting elevated LV filling pressure. - Mitral valve: Mildly thickened leaflets . Mild late systolic bileaflet prolapse. There was  moderate regurgitation. - Left atrium: The atrium was moderately dilated (39 ml/m2). - Right atrium: The atrium was mildly dilated. - Atrial septum: No defect or patent foramen ovale was identified.    ASSESSMENT AND PLAN:  1.  Paroxysmal atrial fibrillation S/p afib ablation 02/08/19 with Dr Rayann Heman. Reports that she is doing very well and is happy with her procedure.  Continue flecainide 50 mg BID Continue Eliquis 5 mg BID Continue diltiazem 240 mg daily with 30 mg PRN q 4hrs for heart racing.  This patients CHA2DS2-VASc Score and unadjusted Ischemic Stroke Rate (% per year) is equal to 4.8 % stroke rate/year from a score of 4  Above score calculated as 1 point each if present [CHF, HTN, DM, Vascular=MI/PAD/Aortic Plaque, Age if 65-74, or Female] Above score calculated as 2 points each if present [Age > 75, or Stroke/TIA/TE]  2. HTN Stable, no changes today.  COVID screen The patient does not have any symptoms that suggest any further testing/ screening at this time.  Social distancing reinforced today.  Follow-up with Dr Rayann Heman as scheduled.   Current medicines are reviewed at length with the patient today.   The patient does not have concerns regarding her medicines.  The following changes were made today:  none  Labs/ tests ordered today include:  No orders of the defined types were placed in this encounter.   Patient Risk:  after full review of this patients clinical status, I feel that they are at moderate risk at this time.   Today, I have spent 11 minutes with the patient with telehealth technology discussing atrial fibrillation and ablation.    Gwenlyn Perking PA-C 03/11/2019 11:31 AM  Afib [facility] [address] [place], [place] [address] [phone]   I hereby voluntarily request, consent and authorize the [facility] and its employed or contracted physicians, physician assistants, nurse practitioners or other  licensed health care professionals (the Practitioner), to provide me with telemedicine health care services (the "Services") as deemed necessary by the treating Practitioner. I acknowledge and consent to receive the Services by the Practitioner via telemedicine. I understand that the telemedicine visit will involve communicating with the Practitioner through live audiovisual communication technology and the disclosure of certain medical information by electronic transmission. I acknowledge that I have been given the opportunity to request an in-person assessment or other available alternative prior to the telemedicine visit and am voluntarily participating in the telemedicine visit.   I understand that I have the right to withhold or withdraw my consent to the use of telemedicine in the course of my care at any time, without affecting my right to future care or treatment, and that the Practitioner or I may terminate the telemedicine visit at any time. I understand that I have the right to inspect all information obtained and/or recorded in the course of the telemedicine visit and may receive copies of available information for a reasonable fee.  I understand that some of the potential risks of receiving the Services via telemedicine include:   Delay or interruption in medical evaluation due to technological equipment failure or disruption;  Information transmitted may not be sufficient (e.g. poor resolution of images) to allow for appropriate medical decision making by the Practitioner; and/or  In rare instances, security protocols could fail, causing a breach of personal health information.   Furthermore, I acknowledge that it is my responsibility to provide information about my medical history, conditions and care that is complete and accurate to the best of my ability. I acknowledge that Practitioner's advice, recommendations, and/or decision may be based on factors not within their control, such as  incomplete or inaccurate data provided by me or distortions of diagnostic images or specimens that may result from electronic transmissions. I understand that the practice of medicine is not an exact science and that Practitioner makes no warranties or guarantees regarding treatment outcomes. I acknowledge that I will receive a copy of this consent concurrently upon execution via email to the email address I last provided but may also request a printed copy by calling the office of the [facility].  I understand that my insurance will be billed for this visit.   I have read or had this consent read to me.  I understand the contents of this consent, which adequately explains the benefits and risks of the Services being provided via telemedicine.  I have been provided ample opportunity to ask questions regarding this consent and the Services and have had my questions answered to my satisfaction.  I give my informed consent for the services to be provided through the use of telemedicine in my medical care  By participating in this telemedicine visit I agree to the above.

## 2019-03-16 ENCOUNTER — Other Ambulatory Visit (HOSPITAL_COMMUNITY): Payer: Self-pay | Admitting: *Deleted

## 2019-03-16 MED ORDER — DILTIAZEM HCL ER COATED BEADS 240 MG PO CP24: 240.0000 mg | ORAL_CAPSULE | Freq: Every day | ORAL | 3 refills | Status: DC

## 2019-03-17 ENCOUNTER — Other Ambulatory Visit: Payer: Self-pay | Admitting: Internal Medicine

## 2019-03-31 ENCOUNTER — Other Ambulatory Visit: Payer: Self-pay | Admitting: Internal Medicine

## 2019-03-31 MED ORDER — FLECAINIDE ACETATE 50 MG PO TABS: 50.0000 mg | ORAL_TABLET | Freq: Two times a day (BID) | ORAL | 2 refills | Status: DC

## 2019-03-31 NOTE — Telephone Encounter (Signed)
° ° °*  STAT* If patient is at the pharmacy, call can be transferred to refill team.   1. Which medications need to be refilled? (please list name of each medication and dose if known)  flecainide (TAMBOCOR) 50 MG tablet  2. Which pharmacy/location (including street and city if local pharmacy) is medication to be sent to?   [place], [place]  3. Do they need a 30 day or 90 day supply? 90   Patient has all of her other medications delivered through [facility], and would like this medication to come from [facility] as well.

## 2019-04-25 DIAGNOSIS — Z1231 Encounter for screening mammogram for malignant neoplasm of breast: Secondary | ICD-10-CM | POA: Diagnosis not present

## 2019-04-25 DIAGNOSIS — Z6823 Body mass index (BMI) 23.0-23.9, adult: Secondary | ICD-10-CM | POA: Diagnosis not present

## 2019-04-25 DIAGNOSIS — Z01419 Encounter for gynecological examination (general) (routine) without abnormal findings: Secondary | ICD-10-CM | POA: Diagnosis not present

## 2019-05-09 ENCOUNTER — Telehealth: Payer: Self-pay

## 2019-05-09 NOTE — Telephone Encounter (Signed)
Spoke with pt regarding appt on 05/11/19. Pt stated she will check her vitals prior to appt and did not have any questions at this time.

## 2019-05-10 ENCOUNTER — Ambulatory Visit (INDEPENDENT_AMBULATORY_CARE_PROVIDER_SITE_OTHER): Payer: Medicare HMO

## 2019-05-10 DIAGNOSIS — Z23 Encounter for immunization: Secondary | ICD-10-CM | POA: Diagnosis not present

## 2019-05-11 ENCOUNTER — Other Ambulatory Visit: Payer: Self-pay

## 2019-05-11 ENCOUNTER — Encounter: Payer: Self-pay | Admitting: Internal Medicine

## 2019-05-11 ENCOUNTER — Telehealth (INDEPENDENT_AMBULATORY_CARE_PROVIDER_SITE_OTHER): Payer: Medicare HMO | Admitting: Internal Medicine

## 2019-05-11 VITALS — BP 117/70 | HR 67 | Ht 61.0 in | Wt 122.0 lb

## 2019-05-11 DIAGNOSIS — I480 Paroxysmal atrial fibrillation: Secondary | ICD-10-CM

## 2019-05-11 DIAGNOSIS — I10 Essential (primary) hypertension: Secondary | ICD-10-CM

## 2019-05-11 NOTE — Progress Notes (Signed)
Electrophysiology TeleHealth Note  Due to national recommendations of social distancing due to [place] 19, an audio telehealth visit is felt to be most appropriate for this patient at this time.  Verbal consent was obtained by me for the telehealth visit today.  The patient does not have capability for a virtual visit.  A phone visit is therefore required today.   Date:  05/11/2019   ID:  Alexandria Mcdowell, DOB Aug 25, 1941, MRN [id number]  Location: patient's home  Provider location:  [facility]  Evaluation Performed: Follow-up visit  PCP:  Jinny Sanders, MD   Electrophysiologist:  Dr Rayann Heman  Chief Complaint:  afib  History of Present Illness:    Alexandria Mcdowell is a 78 y.o. female who presents via telehealth conferencing today.  Since her afib ablation, the patient reports doing very well.  Afib has improved.  She is pleased with results.  Denies procedure related complications.  Today, she denies symptoms of palpitations, chest pain, shortness of breath,  lower extremity edema, dizziness, presyncope, or syncope.  The patient is otherwise without complaint today.  The patient denies symptoms of fevers, chills, cough, or new SOB worrisome for COVID 19.  Past Medical History:  Diagnosis Date  . Allergy   . Anemia   . Hyperlipidemia   . Hypertension   . Migraine   . Paroxysmal atrial fibrillation [facility])     Past Surgical History:  Procedure Laterality Date  . APPENDECTOMY    . ATRIAL FIBRILLATION ABLATION N/A 02/08/2019   Procedure: ATRIAL FIBRILLATION ABLATION;  Surgeon: Thompson Grayer, MD;  Location: [place] CV LAB;  Service: Cardiovascular;  Laterality: N/A;  . BREAST BIOPSY    . CARDIOVERSION N/A 12/29/2018   Procedure: CARDIOVERSION;  Surgeon: Dorothy Spark, MD;  Location: [facility] ENDOSCOPY;  Service: Cardiovascular;  Laterality: N/A;  . CATARACT EXTRACTION  05/05/13   right eye   . COLONOSCOPY  2006  . [place]  . WISDOM TOOTH EXTRACTION       Current Outpatient Medications  Medication Sig Dispense Refill  . alendronate (FOSAMAX) 35 MG tablet Take 35 mg by mouth every Tuesday. Take with a full glass of water on an empty stomach.     Marland Kitchen atorvastatin (LIPITOR) 10 MG tablet Take 10 mg by mouth daily.    . Cholecalciferol (D3 VITAMIN PO) Take 10 mcg by mouth 2 (two) times daily.    Marland Kitchen diltiazem (CARDIZEM CD) 240 MG 24 hr capsule Take 1 capsule (240 mg total) by mouth daily. 90 capsule 3  . diltiazem (CARDIZEM) 30 MG tablet Take 30 mg by mouth as needed (afib).    . flecainide (TAMBOCOR) 50 MG tablet Take 1 tablet (50 mg total) by mouth 2 (two) times daily. 180 tablet 2  . lisinopril-hydrochlorothiazide (PRINZIDE,ZESTORETIC) 10-12.5 MG tablet Take 1 tablet by mouth daily. 90 tablet 3  . Multiple Vitamins-Minerals (MULTIVITAMIN WITH MINERALS) tablet Take 1 tablet by mouth daily with supper.     . Omega-3 Fatty Acids (FISH OIL) 1200 MG CAPS Take 1,200 mg by mouth daily with supper.     Vladimir Faster Glycol-Propyl Glycol (LUBRICANT EYE DROPS) 0.4-0.3 % SOLN Place 1 drop into both eyes 3 (three) times daily as needed (for dry/irritated eyes.).    Marland Kitchen sodium chloride (OCEAN) 0.65 % SOLN nasal spray Place 1 spray into both nostrils 4 (four) times daily as needed for congestion (allergies.).     Marland Kitchen apixaban (ELIQUIS) 5 MG TABS tablet Take  1 tablet (5 mg total) by mouth 2 (two) times daily for 30 days. 180 tablet 1   No current facility-administered medications for this visit.     Allergies:   Clindamycin hcl, Codeine, Erythromycin, Tetracycline, Penicillins, and Sulfonamide derivatives   Social History:  The patient  reports that she has never smoked. She has never used smokeless tobacco. She reports that she does not drink alcohol or use drugs.   Family History:  The patient's  family history includes Cancer in her mother; Diabetes in her father; Emphysema in her father; Heart disease in her mother; Hyperlipidemia in her mother and sister;  Hypertension in her mother and sister; Hypothyroidism in her sister.   ROS:  Please see the history of present illness.   All other systems are personally reviewed and negative.    Exam:    Vital Signs:  BP 117/70   Pulse 67   Ht 5\' 1"  (1.549 m)   Wt 122 lb (55.3 kg)   BMI 23.05 kg/m   Well sounding  Labs/Other Tests and Data Reviewed:    Recent Labs: 06/23/2018: ALT 28 12/28/2018: TSH 0.732 01/04/2019: Magnesium 1.8 01/25/2019: BUN 16; Creatinine, Ser 0.90; Hemoglobin 14.8; Platelets 299; Potassium 3.6; Sodium 134   Wt Readings from Last 3 Encounters:  05/11/19 122 lb (55.3 kg)  02/08/19 119 lb (54 kg)  01/31/19 122 lb 3.2 oz (55.4 kg)      ASSESSMENT & PLAN:    1.  Paroxysmal atrial fibrillation No recurrence post ablation Pleased with results of her procedure chads2vasc score is at least 4.  Continue eliquis We discussed stopping flecainide.  She would prefer to continue flecainide for now but will consider stopping this medicine on return  2. HTN Stable No change required today   Follow-up:  3 months with me   Patient Risk:  after full review of this patients clinical status, I feel that they are at moderate risk at this time.  Today, I have spent 15 minutes with the patient with telehealth technology discussing arrhythmia management .    Army Fossa, MD  05/11/2019 10:27 AM     [address] [place] [phone] (office) [phone] (fax)

## 2019-06-01 ENCOUNTER — Other Ambulatory Visit: Payer: Self-pay | Admitting: Family Medicine

## 2019-06-01 ENCOUNTER — Other Ambulatory Visit (HOSPITAL_COMMUNITY): Payer: Self-pay | Admitting: *Deleted

## 2019-06-01 ENCOUNTER — Other Ambulatory Visit (HOSPITAL_COMMUNITY): Payer: Self-pay | Admitting: Physician Assistant

## 2019-06-01 MED ORDER — ATORVASTATIN CALCIUM 10 MG PO TABS: 10.0000 mg | ORAL_TABLET | Freq: Every day | ORAL | 0 refills | Status: DC

## 2019-06-13 DIAGNOSIS — Z0100 Encounter for examination of eyes and vision without abnormal findings: Secondary | ICD-10-CM | POA: Diagnosis not present

## 2019-06-13 DIAGNOSIS — H524 Presbyopia: Secondary | ICD-10-CM | POA: Diagnosis not present

## 2019-06-20 ENCOUNTER — Other Ambulatory Visit (HOSPITAL_COMMUNITY): Payer: Self-pay | Admitting: *Deleted

## 2019-06-20 ENCOUNTER — Other Ambulatory Visit: Payer: Self-pay | Admitting: *Deleted

## 2019-06-20 MED ORDER — DILTIAZEM HCL 30 MG PO TABS: 30.0000 mg | ORAL_TABLET | ORAL | 2 refills | Status: DC | PRN

## 2019-06-20 MED ORDER — ATORVASTATIN CALCIUM 10 MG PO TABS: 10.0000 mg | ORAL_TABLET | Freq: Every day | ORAL | 0 refills | Status: DC

## 2019-06-20 MED ORDER — DILTIAZEM HCL ER COATED BEADS 240 MG PO CP24: 240.0000 mg | ORAL_CAPSULE | Freq: Every day | ORAL | 3 refills | Status: DC

## 2019-06-25 ENCOUNTER — Other Ambulatory Visit (HOSPITAL_COMMUNITY): Payer: Self-pay | Admitting: Physician Assistant

## 2019-06-27 NOTE — Telephone Encounter (Signed)
Pt's pharmacy is requesting a refill on Atorvastatin. Would Dr. Rayann Heman like to refill this medication? Please address

## 2019-06-30 ENCOUNTER — Telehealth: Payer: Self-pay | Admitting: Family Medicine

## 2019-06-30 DIAGNOSIS — Z862 Personal history of diseases of the blood and blood-forming organs and certain disorders involving the immune mechanism: Secondary | ICD-10-CM

## 2019-06-30 DIAGNOSIS — E7800 Pure hypercholesterolemia, unspecified: Secondary | ICD-10-CM

## 2019-06-30 NOTE — Telephone Encounter (Signed)
-----   Message from Cloyd Stagers, RT sent at 06/24/2019  2:16 PM EDT ----- Regarding: Lab Orders for Friday 10.16.2020 Please place lab orders for Friday 10.16.2020, office visit for physical on Friday 10.23.2020 Thank you, Dyke Maes RT(R)

## 2019-07-01 ENCOUNTER — Ambulatory Visit: Payer: Medicare HMO

## 2019-07-01 ENCOUNTER — Ambulatory Visit (INDEPENDENT_AMBULATORY_CARE_PROVIDER_SITE_OTHER): Payer: Medicare HMO

## 2019-07-01 ENCOUNTER — Other Ambulatory Visit (INDEPENDENT_AMBULATORY_CARE_PROVIDER_SITE_OTHER): Payer: Medicare HMO

## 2019-07-01 DIAGNOSIS — E7800 Pure hypercholesterolemia, unspecified: Secondary | ICD-10-CM

## 2019-07-01 DIAGNOSIS — Z0000 Encounter for general adult medical examination without abnormal findings: Secondary | ICD-10-CM

## 2019-07-01 LAB — LIPID PANEL
Cholesterol: 162 mg/dL (ref 0–200)
HDL: 54.6 mg/dL (ref >39.00)
LDL Cholesterol: 68 mg/dL (ref 0–99)
NonHDL: 107.57
Total CHOL/HDL Ratio: 3
Triglycerides: 197 mg/dL — ABNORMAL HIGH (ref 0.0–149.0)
VLDL: 39.4 mg/dL (ref 0.0–40.0)

## 2019-07-01 LAB — COMPREHENSIVE METABOLIC PANEL
ALT: 21 U/L (ref 0–35)
AST: 22 U/L (ref 0–37)
Albumin: 4.4 g/dL (ref 3.5–5.2)
Alkaline Phosphatase: 140 U/L — ABNORMAL HIGH (ref 39–117)
BUN: 19 mg/dL (ref 6–23)
CO2: 30 mEq/L (ref 19–32)
Calcium: 9.6 mg/dL (ref 8.4–10.5)
Chloride: 100 mEq/L (ref 96–112)
Creatinine, Ser: 0.87 mg/dL (ref 0.40–1.20)
GFR: 62.83 mL/min (ref >60.00)
Glucose, Bld: 115 mg/dL — ABNORMAL HIGH (ref 70–99)
Potassium: 4 mEq/L (ref 3.5–5.1)
Sodium: 139 mEq/L (ref 135–145)
Total Bilirubin: 0.6 mg/dL (ref 0.2–1.2)
Total Protein: 7 g/dL (ref 6.0–8.3)

## 2019-07-01 NOTE — Progress Notes (Signed)
Subjective:   Alexandria Mcdowell is a 78 y.o. female who presents for Medicare Annual (Subsequent) preventive examination.  Review of Systems:    This visit is being conducted through telemedicine via telephone at the nurse health advisor's home address due to the COVID-19 pandemic. This patient has given me verbal consent via doximity to conduct this visit, patient states they are participating from their home address. Patient and myself are on the telephone call. There is no referral for this visit. Some vital signs may be absent or patient reported.    Patient identification: identified by name, DOB, and current address  Cardiac Risk Factors include: advanced age (>12men, >41 women);hypertension     Objective:     Vitals: There were no vitals taken for this visit.  There is no height or weight on file to calculate BMI.  Advanced Directives 07/01/2019 02/08/2019 01/25/2019 12/29/2018 12/28/2018 12/28/2018 12/22/2018  Does Patient Have a Medical Advance Directive? Yes Yes Yes No - No No  Type of Paramedic of [place];Living will Living will;Healthcare Power of [place];Living will - - - -  Does patient want to make changes to medical advance directive? - - - - - - -  Copy of [place] in Chart? Yes - validated most recent copy scanned in chart (See row information) No - copy requested - - - - -  Would patient like information on creating a medical advance directive? - - - No - Guardian declined No - Patient declined - -    Tobacco Social History   Tobacco Use  Smoking Status Never Smoker  Smokeless Tobacco Never Used     Counseling given: Not Answered   Clinical Intake:  Pre-visit preparation completed: Yes  Pain : No/denies pain     Nutritional Risks: None Diabetes: No  How often do you need to have someone help you when you read instructions, pamphlets, or other written materials from your doctor or  pharmacy?: 1 - Never What is the last grade level you completed in school?: college degree  Interpreter Needed?: No  Information entered by :: CJohnson, LPN  Past Medical History:  Diagnosis Date  . Allergy   . Anemia   . Hyperlipidemia   . Hypertension   . Migraine   . Paroxysmal atrial fibrillation [facility])    Past Surgical History:  Procedure Laterality Date  . APPENDECTOMY    . ATRIAL FIBRILLATION ABLATION N/A 02/08/2019   Procedure: ATRIAL FIBRILLATION ABLATION;  Surgeon: Thompson Grayer, MD;  Location: [facility];  Service: Cardiovascular;  Laterality: N/A;  . BREAST BIOPSY    . CARDIOVERSION N/A 12/29/2018   Procedure: CARDIOVERSION;  Surgeon: Dorothy Spark, MD;  Location: [facility] ENDOSCOPY;  Service: Cardiovascular;  Laterality: N/A;  . CATARACT EXTRACTION  05/05/13   right eye   . COLONOSCOPY  2006  . [place]  . WISDOM TOOTH EXTRACTION     Family History  Problem Relation Age of Onset  . Hypertension Mother   . Hyperlipidemia Mother   . Cancer Mother        breast/colon  . Heart disease Mother   . Diabetes Father   . Emphysema Father   . Hyperlipidemia Sister   . Hypertension Sister   . Hypothyroidism Sister    Social History   Socioeconomic History  . Marital status: Married    Spouse name: Not on file  . Number of children: Not on  file  . Years of education: Not on file  . Highest education level: Not on file  Occupational History  . Not on file  Social Needs  . Financial resource strain: Not hard at all  . Food insecurity    Worry: Never true    Inability: Never true  . Transportation needs    Medical: No    Non-medical: No  Tobacco Use  . Smoking status: Never Smoker  . Smokeless tobacco: Never Used  Substance and Sexual Activity  . Alcohol use: No  . Drug use: No  . Sexual activity: Yes  Lifestyle  . Physical activity    Days per week: 0 days    Minutes per session: 0 min  . Stress: Not at all   Relationships  . Social Herbalist on phone: Not on file    Gets together: Not on file    Attends religious service: Not on file    Active member of club or organization: Not on file    Attends meetings of clubs or organizations: Not on file    Relationship status: Not on file  Other Topics Concern  . 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 Laymen's guide and forms provided.    Reviewed Aug '15      Exercsie:5 days a week, treadmill   Diet: healthy, weight watchers.         Attends [employer]    Outpatient Encounter Medications as of 07/01/2019  Medication Sig  . alendronate (FOSAMAX) 35 MG tablet Take 35 mg by mouth every Tuesday. Take with a full glass of water on an empty stomach.   Marland Kitchen atorvastatin (LIPITOR) 10 MG tablet Take 1 tablet (10 mg total) by mouth daily.  . Cholecalciferol (D3 VITAMIN PO) Take 10 mcg by mouth 2 (two) times daily.  Marland Kitchen diltiazem (CARDIZEM CD) 240 MG 24 hr capsule Take 1 capsule (240 mg total) by mouth daily.  Marland Kitchen diltiazem (CARDIZEM) 30 MG tablet Take 1 tablet (30 mg total) by mouth as needed (afib).  . flecainide (TAMBOCOR) 50 MG tablet Take 1 tablet (50 mg total) by mouth 2 (two) times daily.  Marland Kitchen lisinopril-hydrochlorothiazide (ZESTORETIC) 10-12.5 MG tablet TAKE 1 TABLET EVERY DAY  . Multiple Vitamins-Minerals (MULTIVITAMIN WITH MINERALS) tablet Take 1 tablet by mouth daily with supper.   . Omega-3 Fatty Acids (FISH OIL) 1200 MG CAPS Take 1,200 mg by mouth daily with supper.   Vladimir Faster Glycol-Propyl Glycol (LUBRICANT EYE DROPS) 0.4-0.3 % SOLN Place 1 drop into both eyes 3 (three) times daily as needed (for dry/irritated eyes.).  Marland Kitchen sodium chloride (OCEAN) 0.65 % SOLN nasal spray Place  1 spray into both nostrils 4 (four) times daily as needed for congestion (allergies.).   Marland Kitchen apixaban (ELIQUIS) 5 MG TABS tablet Take 1 tablet (5 mg total) by mouth 2 (two) times daily for 30 days.   No facility-administered encounter medications on file as of 07/01/2019.     Activities of Daily Living In your present state of health, do you have any difficulty performing the following activities: 07/01/2019 12/28/2018  Hearing? Y N  Comment wears hearing aids -  Vision? N N  Difficulty concentrating  or making decisions? N N  Walking or climbing stairs? N N  Dressing or bathing? N N  Doing errands, shopping? N N  Preparing Food and eating ? N -  Using the Toilet? N -  In the past six months, have you accidently leaked urine? N -  Do you have problems with loss of bowel control? N -  Managing your Medications? N -  Managing your Finances? N -  Housekeeping or managing your Housekeeping? N -  Some recent data might be hidden    Patient Care Team: Jinny Sanders, MD as PCP - General (Family Medicine) Josue Hector, MD as PCP - Cardiology (Cardiology) Lavonna Monarch, MD (Dermatology) Madilyn Hook, [place] as Consulting Physician (Optometry)    Assessment:   This is a routine wellness examination for [place].  Exercise Activities and Dietary recommendations Current Exercise Habits: The patient does not participate in regular exercise at present, Exercise limited by: None identified  Goals    . Increase physical activity     Starting 06/23/2018, I will continue to exercise for at least 30-45 min 5 days per week.     . Patient Stated     07/01/2019, I will start increasing my physical activity and exercising more daily.        Fall Risk Fall Risk  07/01/2019 06/23/2018 06/17/2017 05/20/2016 05/18/2015  Falls in the past year? 1 No No No No  Number falls in past yr: 0 - - - -  Injury with Fall? 0 - - - -  Risk for fall due to : Medication side effect - - - -  Follow up Falls  evaluation completed;Falls prevention discussed - - - -   Is the patient's home free of loose throw rugs in walkways, pet beds, electrical cords, etc?   yes      Grab bars in the bathroom? yes      Handrails on the stairs?   yes      Adequate lighting?   yes  Timed Get Up and Go performed: N/A  Depression Screen PHQ 2/9 Scores 07/01/2019 06/23/2018 06/17/2017 05/20/2016  PHQ - 2 Score 0 0 0 0  PHQ- 9 Score 0 0 0 -     Cognitive Function MMSE - Mini Mental State Exam 07/01/2019 06/23/2018 06/17/2017 05/20/2016  Orientation to time 5 5 5 5   Orientation to Place 5 5 5 5   Registration 3 3 3 3   Attention/ Calculation 5 0 0 0  Recall 3 3 3 3   Language- name 2 objects - 0 0 0  Language- repeat 1 1 1 1   Language- follow 3 step command - 3 3 3   Language- read & follow direction - 0 0 0  Write a sentence - 0 0 0  Copy design - 0 0 0  Total score - 20 20 20   Mini Cog  Mini-Cog screen was completed. Maximum score is 22. A value of 0 denotes this part of the MMSE was not completed or the patient failed this part of the Mini-Cog screening.      Immunization History  Administered Date(s) Administered  . Influenza Split 06/19/2011, 06/22/2012  . Influenza Whole 06/29/2007, 06/20/2008, 06/13/2010  . Influenza, High Dose Seasonal PF 06/06/2017, 06/15/2018  . Influenza,inj,Quad PF,6+ Mos 05/17/2013, 05/12/2014, 05/18/2015, 05/20/2016, 05/10/2019  . Pneumococcal Conjugate-13 05/18/2015  . Pneumococcal Polysaccharide-23 02/23/2007  . Td 01/30/2003  . Tetanus 05/10/2013    Qualifies for Shingles Vaccine? Yes  Screening Tests Health Maintenance  Topic  Date Due  . TETANUS/TDAP  05/11/2023  . INFLUENZA VACCINE  Completed  . DEXA SCAN  Completed  . PNA vac Low Risk Adult  Completed    Cancer Screenings: Lung: Low Dose CT Chest recommended if Age 68-80 years, 30 pack-year currently smoking OR have quit w/in 15years. Patient does not qualify. Breast:  Up to date on Mammogram? Yes, completed  04/25/2019 per patient   Up to date of Bone Density/Dexa? Yes, completed 04/16/2018 Colorectal: no longer required  Additional Screenings:  Hepatitis C Screening: n/a     Plan:    Patient will work on increasing physical activity and exercising more daily.    I have personally reviewed and noted the following in the patient's chart:   . Medical and social history . Use of alcohol, tobacco or illicit drugs  . Current medications and supplements . Functional ability and status . Nutritional status . Physical activity . Advanced directives . List of other physicians . Hospitalizations, surgeries, and ER visits in previous 12 months . Vitals . Screenings to include cognitive, depression, and falls . Referrals and appointments  In addition, I have reviewed and discussed with patient certain preventive protocols, quality metrics, and best practice recommendations. A written personalized care plan for preventive services as well as general preventive health recommendations were provided to patient.     Andrez Grime, LPN  [id number]

## 2019-07-01 NOTE — Progress Notes (Signed)
PCP notes:  Health Maintenance: Declined Shingrix    Abnormal Screenings: none    Patient concerns: none    Nurse concerns: none    Next PCP appt.: 07/08/2019 @ 10 am

## 2019-07-01 NOTE — Patient Instructions (Signed)
[name] , Thank you for taking time to come for your Medicare Wellness Visit. I appreciate your ongoing commitment to your health goals. Please review the following plan we discussed and let me know if I can assist you in the future.   Screening recommendations/referrals: Colonoscopy: no longer required Mammogram: up to date, completed 04/25/2019 per patient  Bone Density: up to date, completed 04/16/2018 Recommended yearly ophthalmology/optometry visit for glaucoma screening and checkup Recommended yearly dental visit for hygiene and checkup  Vaccinations: Influenza vaccine: up to date, completed 05/10/2019 Pneumococcal vaccine: Completed series Tdap vaccine: up to date, completed 05/10/2013 Shingles vaccine: declined    Advanced directives: copy in chart  Conditions/risks identified: hypertension, hypercholesterolemia  Next appointment: 07/08/2019 @ 10 am    Preventive Care 79 Years and Older, Female Preventive care refers to lifestyle choices and visits with your health care provider that can promote health and wellness. What does preventive care include?  A yearly physical exam. This is also called an annual well check.  Dental exams once or twice a year.  Routine eye exams. Ask your health care provider how often you should have your eyes checked.  Personal lifestyle choices, including:  Daily care of your teeth and gums.  Regular physical activity.  Eating a healthy diet.  Avoiding tobacco and drug use.  Limiting alcohol use.  Practicing safe sex.  Taking low-dose aspirin every day.  Taking vitamin and mineral supplements as recommended by your health care provider. What happens during an annual well check? The services and screenings done by your health care provider during your annual well check will depend on your age, overall health, lifestyle risk factors, and family history of disease. Counseling  Your health care provider may ask you questions about  your:  Alcohol use.  Tobacco use.  Drug use.  Emotional well-being.  Home and relationship well-being.  Sexual activity.  Eating habits.  History of falls.  Memory and ability to understand (cognition).  Work and work Statistician.  Reproductive health. Screening  You may have the following tests or measurements:  Height, weight, and BMI.  Blood pressure.  Lipid and cholesterol levels. These may be checked every 5 years, or more frequently if you are over 59 years old.  Skin check.  Lung cancer screening. You may have this screening every year starting at age 45 if you have a 30-pack-year history of smoking and currently smoke or have quit within the past 15 years.  Fecal occult blood test (FOBT) of the stool. You may have this test every year starting at age 11.  Flexible sigmoidoscopy or colonoscopy. You may have a sigmoidoscopy every 5 years or a colonoscopy every 10 years starting at age 12.  Hepatitis C blood test.  Hepatitis B blood test.  Sexually transmitted disease (STD) testing.  Diabetes screening. This is done by checking your blood sugar (glucose) after you have not eaten for a while (fasting). You may have this done every 1-3 years.  Bone density scan. This is done to screen for osteoporosis. You may have this done starting at age 63.  Mammogram. This may be done every 1-2 years. Talk to your health care provider about how often you should have regular mammograms. Talk with your health care provider about your test results, treatment options, and if necessary, the need for more tests. Vaccines  Your health care provider may recommend certain vaccines, such as:  Influenza vaccine. This is recommended every year.  Tetanus, diphtheria, and acellular pertussis (  Tdap, Td) vaccine. You may need a Td booster every 10 years.  Zoster vaccine. You may need this after age 28.  Pneumococcal 13-valent conjugate (PCV13) vaccine. One dose is recommended  after age 70.  Pneumococcal polysaccharide (PPSV23) vaccine. One dose is recommended after age 53. Talk to your health care provider about which screenings and vaccines you need and how often you need them. This information is not intended to replace advice given to you by your health care provider. Make sure you discuss any questions you have with your health care provider. Document Released: 09/28/2015 Document Revised: 05/21/2016 Document Reviewed: 07/03/2015 Elsevier Interactive Patient Education  2017 [place] Prevention in the Home Falls can cause injuries. They can happen to people of all ages. There are many things you can do to make your home safe and to help prevent falls. What can I do on the outside of my home?  Regularly fix the edges of walkways and driveways and fix any cracks.  Remove anything that might make you trip as you walk through a door, such as a raised step or threshold.  Trim any bushes or trees on the path to your home.  Use bright outdoor lighting.  Clear any walking paths of anything that might make someone trip, such as rocks or tools.  Regularly check to see if handrails are loose or broken. Make sure that both sides of any steps have handrails.  Any raised decks and porches should have guardrails on the edges.  Have any leaves, snow, or ice cleared regularly.  Use sand or salt on walking paths during winter.  Clean up any spills in your garage right away. This includes oil or grease spills. What can I do in the bathroom?  Use night lights.  Install grab bars by the toilet and in the tub and shower. Do not use towel bars as grab bars.  Use non-skid mats or decals in the tub or shower.  If you need to sit down in the shower, use a plastic, non-slip stool.  Keep the floor dry. Clean up any water that spills on the floor as soon as it happens.  Remove soap buildup in the tub or shower regularly.  Attach bath mats securely with  double-sided non-slip rug tape.  Do not have throw rugs and other things on the floor that can make you trip. What can I do in the bedroom?  Use night lights.  Make sure that you have a light by your bed that is easy to reach.  Do not use any sheets or blankets that are too big for your bed. They should not hang down onto the floor.  Have a firm chair that has side arms. You can use this for support while you get dressed.  Do not have throw rugs and other things on the floor that can make you trip. What can I do in the kitchen?  Clean up any spills right away.  Avoid walking on wet floors.  Keep items that you use a lot in easy-to-reach places.  If you need to reach something above you, use a strong step stool that has a grab bar.  Keep electrical cords out of the way.  Do not use floor polish or wax that makes floors slippery. If you must use wax, use non-skid floor wax.  Do not have throw rugs and other things on the floor that can make you trip. What can I do with my stairs?  Do  not leave any items on the stairs.  Make sure that there are handrails on both sides of the stairs and use them. Fix handrails that are broken or loose. Make sure that handrails are as long as the stairways.  Check any carpeting to make sure that it is firmly attached to the stairs. Fix any carpet that is loose or worn.  Avoid having throw rugs at the top or bottom of the stairs. If you do have throw rugs, attach them to the floor with carpet tape.  Make sure that you have a light switch at the top of the stairs and the bottom of the stairs. If you do not have them, ask someone to add them for you. What else can I do to help prevent falls?  Wear shoes that:  Do not have high heels.  Have rubber bottoms.  Are comfortable and fit you well.  Are closed at the toe. Do not wear sandals.  If you use a stepladder:  Make sure that it is fully opened. Do not climb a closed stepladder.  Make  sure that both sides of the stepladder are locked into place.  Ask someone to hold it for you, if possible.  Clearly mark and make sure that you can see:  Any grab bars or handrails.  First and last steps.  Where the edge of each step is.  Use tools that help you move around (mobility aids) if they are needed. These include:  Canes.  Walkers.  Scooters.  Crutches.  Turn on the lights when you go into a dark area. Replace any light bulbs as soon as they burn out.  Set up your furniture so you have a clear path. Avoid moving your furniture around.  If any of your floors are uneven, fix them.  If there are any pets around you, be aware of where they are.  Review your medicines with your doctor. Some medicines can make you feel dizzy. This can increase your chance of falling. Ask your doctor what other things that you can do to help prevent falls. This information is not intended to replace advice given to you by your health care provider. Make sure you discuss any questions you have with your health care provider. Document Released: 06/28/2009 Document Revised: 02/07/2016 Document Reviewed: 10/06/2014 Elsevier Interactive Patient Education  2017 [employer].

## 2019-07-01 NOTE — Progress Notes (Signed)
No critical labs need to be addressed urgently. We will discuss labs in detail at upcoming office visit.   

## 2019-07-08 ENCOUNTER — Ambulatory Visit (INDEPENDENT_AMBULATORY_CARE_PROVIDER_SITE_OTHER): Payer: Medicare HMO | Admitting: Family Medicine

## 2019-07-08 ENCOUNTER — Other Ambulatory Visit: Payer: Self-pay

## 2019-07-08 ENCOUNTER — Encounter: Payer: Self-pay | Admitting: Family Medicine

## 2019-07-08 VITALS — BP 152/71 | HR 75 | Wt 121.1 lb

## 2019-07-08 DIAGNOSIS — I4891 Unspecified atrial fibrillation: Secondary | ICD-10-CM

## 2019-07-08 DIAGNOSIS — R7303 Prediabetes: Secondary | ICD-10-CM | POA: Diagnosis not present

## 2019-07-08 DIAGNOSIS — E7800 Pure hypercholesterolemia, unspecified: Secondary | ICD-10-CM

## 2019-07-08 DIAGNOSIS — Z0000 Encounter for general adult medical examination without abnormal findings: Secondary | ICD-10-CM | POA: Diagnosis not present

## 2019-07-08 DIAGNOSIS — I10 Essential (primary) hypertension: Secondary | ICD-10-CM | POA: Diagnosis not present

## 2019-07-08 NOTE — Assessment & Plan Note (Signed)
Usually very well controlled on current regimen. Follow Bp at home... call if > 150/90.

## 2019-07-08 NOTE — Patient Instructions (Signed)
Preventive Care 38 Years and Older, Female Preventive care refers to lifestyle choices and visits with your health care provider that can promote health and wellness. This includes:  A yearly physical exam. This is also called an annual well check.  Regular dental and eye exams.  Immunizations.  Screening for certain conditions.  Healthy lifestyle choices, such as diet and exercise. What can I expect for my preventive care visit? Physical exam Your health care provider will check:  Height and weight. These may be used to calculate body mass index (BMI), which is a measurement that tells if you are at a healthy weight.  Heart rate and blood pressure.  Your skin for abnormal spots. Counseling Your health care provider may ask you questions about:  Alcohol, tobacco, and drug use.  Emotional well-being.  Home and relationship well-being.  Sexual activity.  Eating habits.  History of falls.  Memory and ability to understand (cognition).  Work and work Statistician.  Pregnancy and menstrual history. What immunizations do I need?  Influenza (flu) vaccine  This is recommended every year. Tetanus, diphtheria, and pertussis (Tdap) vaccine  You may need a Td booster every 10 years. Varicella (chickenpox) vaccine  You may need this vaccine if you have not already been vaccinated. Zoster (shingles) vaccine  You may need this after age 79. Pneumococcal conjugate (PCV13) vaccine  One dose is recommended after age 79. Pneumococcal polysaccharide (PPSV23) vaccine  One dose is recommended after age 79. Measles, mumps, and rubella (MMR) vaccine  You may need at least one dose of MMR if you were born in 1957 or later. You may also need a second dose. Meningococcal conjugate (MenACWY) vaccine  You may need this if you have certain conditions. Hepatitis A vaccine  You may need this if you have certain conditions or if you travel or work in places where you may be exposed  to hepatitis A. Hepatitis B vaccine  You may need this if you have certain conditions or if you travel or work in places where you may be exposed to hepatitis B. Haemophilus influenzae type b (Hib) vaccine  You may need this if you have certain conditions. You may receive vaccines as individual doses or as more than one vaccine together in one shot (combination vaccines). Talk with your health care provider about the risks and benefits of combination vaccines. What tests do I need? Blood tests  Lipid and cholesterol levels. These may be checked every 5 years, or more frequently depending on your overall health.  Hepatitis C test.  Hepatitis B test. Screening  Lung cancer screening. You may have this screening every year starting at age 79 if you have a 30-pack-year history of smoking and currently smoke or have quit within the past 15 years.  Colorectal cancer screening. All adults should have this screening starting at age 79 and continuing until age 15. Your health care provider may recommend screening at age 23 if you are at increased risk. You will have tests every 1-10 years, depending on your results and the type of screening test.  Diabetes screening. This is done by checking your blood sugar (glucose) after you have not eaten for a while (fasting). You may have this done every 1-3 years.  Mammogram. This may be done every 1-2 years. Talk with your health care provider about how often you should have regular mammograms.  BRCA-related cancer screening. This may be done if you have a family history of breast, ovarian, tubal, or peritoneal cancers.  Other tests  Sexually transmitted disease (STD) testing.  Bone density scan. This is done to screen for osteoporosis. You may have this done starting at age 79. Follow these instructions at home: Eating and drinking  Eat a diet that includes fresh fruits and vegetables, whole grains, lean protein, and low-fat dairy products. Limit  your intake of foods with high amounts of sugar, saturated fats, and salt.  Take vitamin and mineral supplements as recommended by your health care provider.  Do not drink alcohol if your health care provider tells you not to drink.  If you drink alcohol: ? Limit how much you have to 0-1 drink a day. ? Be aware of how much alcohol is in your drink. In the [place] one drink equals one 12 oz bottle of beer (355 mL), one 5 oz glass of wine (148 mL), or one 1 oz glass of hard liquor (44 mL). Lifestyle  Take daily care of your teeth and gums.  Stay active. Exercise for at least 30 minutes on 5 or more days each week.  Do not use any products that contain nicotine or tobacco, such as cigarettes, e-cigarettes, and chewing tobacco. If you need help quitting, ask your health care provider.  If you are sexually active, practice safe sex. Use a condom or other form of protection in order to prevent STIs (sexually transmitted infections).  Talk with your health care provider about taking a low-dose aspirin or statin. What's next?  Go to your health care provider once a year for a well check visit.  Ask your health care provider how often you should have your eyes and teeth checked.  Stay up to date on all vaccines. This information is not intended to replace advice given to you by your health care provider. Make sure you discuss any questions you have with your health care provider. Document Released: 09/28/2015 Document Revised: 08/26/2018 Document Reviewed: 08/26/2018 Elsevier Patient Education  2020 [employer].

## 2019-07-08 NOTE — Assessment & Plan Note (Signed)
Will check A1C next year. Work on low [employer].

## 2019-07-08 NOTE — Assessment & Plan Note (Signed)
This patients CHA2DS2-VASc Score and unadjusted Ischemic Stroke Rate (% per year) is equal to 4.8 % stroke rate/year from a score of 4 On Eliquis  Cardizem for rate control.

## 2019-07-08 NOTE — Progress Notes (Signed)
VIRTUAL VISIT Due to national recommendations of social distancing due to [place] 19, a virtual visit is felt to be most appropriate for this patient at this time.   I connected with the patient on 07/08/19 at 10:00 AM EDT by phone platform and verified that I am speaking with the correct person using two identifiers.  Interactive audio and video telecommunications were attempted between this provider and patient, however failed, due to patient having technical difficulties OR patient did not have access to video capability.  We continued and completed visit with audio only.    I discussed the limitations, risks, security and privacy concerns of performing an evaluation and management service by  virtual telehealth platform and the availability of in person appointments. I also discussed with the patient that there may be a patient responsible charge related to this service. The patient expressed understanding and agreed to proceed.  Patient location: Home Provider Location: [facility] Participants: Eliezer Lofts and [name]   Chief Complaint  Patient presents with  . Annual Exam    History of Present Illness: The patient presents for  complete physical and review of chronic health problems. He/She also has the following acute concerns today:none  The patient saw a LPN or RN for medicare wellness visit.  Prevention and wellness was reviewed in detail. Note reviewed and important notes copied below. Health Maintenance: Declined Shingrix Abnormal Screenings: none  07/08/19 Afib, paroxsysmal: This patients CHA2DS2-VASc Score and unadjusted Ischemic Stroke Rate (% per year) is equal to 4.8 % stroke rate/year from a score of 4  No recurrence s/p ablation.  On flecainide. On Eliquis  Cardizem for rate control.  Hypertension:   BP elevated today given upset about apportionment scheduling.  BP Readings from Last 3 Encounters:  07/08/19 (!) 152/71  05/11/19 117/70   02/08/19 (!) 119/55  Using medication without problems or lightheadedness: none Chest pain with exertion:none Edema:none Short of breath:none Average home BPs:  Well controlled. Other issues:  prediabetes  Elevated Cholesterol:  At goal  On atorvastatin Lab Results  Component Value Date   CHOL 162 07/01/2019   HDL 54.60 07/01/2019   LDLCALC 68 07/01/2019   LDLDIRECT 65.0 06/23/2018   TRIG 197.0 (H) 07/01/2019   CHOLHDL 3 07/01/2019  Using medications without problems:none Muscle aches: none Diet compliance: healthy diet Exercise: No longer going to [facility] Other complaints:  Patient Care Team: Jinny Sanders, MD as PCP - General (Family Medicine) Josue Hector, MD as PCP - Cardiology (Cardiology) Lavonna Monarch, MD (Dermatology) Madilyn Hook, [place] as Consulting Physician (Optometry) Teena Irani, MD (Inactive) as Consulting Physician (Gastroenterology)   COVID 19 screen No recent travel or known exposure to [place] The patient denies respiratory symptoms of COVID 19 at this time.  The importance of social distancing was discussed today.   Review of Systems  Constitutional: Negative for chills and fever.  HENT: Negative for congestion and ear pain.   Eyes: Negative for pain and redness.  Respiratory: Negative for cough and shortness of breath.   Cardiovascular: Negative for chest pain, palpitations and leg swelling.  Gastrointestinal: Negative for abdominal pain, blood in stool, constipation, diarrhea, nausea and vomiting.  Genitourinary: Negative for dysuria.  Musculoskeletal: Negative for falls and myalgias.  Skin: Negative for rash.  Neurological: Negative for dizziness.  Psychiatric/Behavioral: Negative for depression. The patient is not nervous/anxious.       Past Medical History:  Diagnosis Date  . Allergy   . Anemia   .  Hyperlipidemia   . Hypertension   . Migraine   . Paroxysmal atrial fibrillation (HCC)     reports that she has never smoked. She has  never used smokeless tobacco. She reports that she does not drink alcohol or use drugs.   Current Outpatient Medications:  .  alendronate (FOSAMAX) 35 MG tablet, Take 35 mg by mouth every Tuesday. Take with a full glass of water on an empty stomach. , Disp: , Rfl:  .  atorvastatin (LIPITOR) 10 MG tablet, Take 1 tablet (10 mg total) by mouth daily., Disp: 90 tablet, Rfl: 0 .  Cholecalciferol (D3 VITAMIN PO), Take 10 mcg by mouth 2 (two) times daily., Disp: , Rfl:  .  diltiazem (CARDIZEM CD) 240 MG 24 hr capsule, Take 1 capsule (240 mg total) by mouth daily., Disp: 90 capsule, Rfl: 3 .  diltiazem (CARDIZEM) 30 MG tablet, Take 1 tablet (30 mg total) by mouth as needed (afib)., Disp: 90 tablet, Rfl: 2 .  flecainide (TAMBOCOR) 50 MG tablet, Take 1 tablet (50 mg total) by mouth 2 (two) times daily., Disp: 180 tablet, Rfl: 2 .  lisinopril-hydrochlorothiazide (ZESTORETIC) 10-12.5 MG tablet, TAKE 1 TABLET EVERY DAY, Disp: 90 tablet, Rfl: 0 .  Multiple Vitamins-Minerals (MULTIVITAMIN WITH MINERALS) tablet, Take 1 tablet by mouth daily with supper. , Disp: , Rfl:  .  Omega-3 Fatty Acids (FISH OIL) 1200 MG CAPS, Take 1,200 mg by mouth daily with supper. , Disp: , Rfl:  .  Polyethyl Glycol-Propyl Glycol (LUBRICANT EYE DROPS) 0.4-0.3 % SOLN, Place 1 drop into both eyes 3 (three) times daily as needed (for dry/irritated eyes.)., Disp: , Rfl:  .  sodium chloride (OCEAN) 0.65 % SOLN nasal spray, Place 1 spray into both nostrils 4 (four) times daily as needed for congestion (allergies.). , Disp: , Rfl:  .  apixaban (ELIQUIS) 5 MG TABS tablet, Take 1 tablet (5 mg total) by mouth 2 (two) times daily for 30 days., Disp: 180 tablet, Rfl: 1   Observations/Objective: Blood pressure (!) 152/71, pulse 75, weight 121 lb 2 oz (54.9 kg).  Physical Exam  Physical Exam Constitutional:      General: The patient is not in acute distress. Pulmonary:     Effort: Pulmonary effort is normal. No respiratory distress.   Neurological:     Mental Status: The patient is alert and oriented to person, place, and time.  Psychiatric:        Mood and Affect: Mood normal.        Behavior: Behavior normal.   Assessment and Plan The patient's preventative maintenance and recommended screening tests for an annual wellness exam were reviewed in full today. Brought up to date unless services declined.  Counselled on the importance of diet, exercise, and its role in overall health and mortality. The patient's FH and SH was reviewed, including their home life, tobacco status, and drug and alcohol status.   Vaccines:Uptodate , not interested in shingles vaccine. Mammo: nml 04/2019 per pt, Plans q2 years. Mother breast cancer. Colon: Dr Amedeo Plenty, 2016 nml, mother colon cancer DEXA: worsened osteopenia 2019, q 2 year... Now on fosamx 2019, repeat 2021 PAP/DVE : PAP not indicated, no family hx of uterine ovarian cancer. No concerns. No DVE indicated. Non smoker    I discussed the assessment and treatment plan with the patient. The patient was provided an opportunity to ask questions and all were answered. The patient agreed with the plan and demonstrated an understanding of the instructions.   The patient  was advised to call back or seek an in-person evaluation if the symptoms worsen or if the condition fails to improve as anticipated.  Atrial fibrillation with RVR (HCC) This patients CHA2DS2-VASc Score and unadjusted Ischemic Stroke Rate (% per year) is equal to 4.8 % stroke rate/year from a score of 4 On Eliquis  Cardizem for rate control.   Essential hypertension Usually very well controlled on current regimen. Follow Bp at home... call if > 150/90.  Hypercholesterolemia At goal on atorvastatin.  Prediabetes  Will check A1C next year. Work on low [employer].   Total visit time 20 minutes, > 50% spent counseling and cordinating patients care.     Eliezer Lofts, MD

## 2019-07-08 NOTE — Assessment & Plan Note (Signed)
At goal on atorvastatin.  

## 2019-07-15 ENCOUNTER — Other Ambulatory Visit (HOSPITAL_COMMUNITY): Payer: Self-pay | Admitting: *Deleted

## 2019-07-15 MED ORDER — APIXABAN 5 MG PO TABS: 5.0000 mg | ORAL_TABLET | Freq: Two times a day (BID) | ORAL | 2 refills | Status: DC

## 2019-08-08 ENCOUNTER — Other Ambulatory Visit: Payer: Self-pay | Admitting: Family Medicine

## 2019-08-15 ENCOUNTER — Telehealth (INDEPENDENT_AMBULATORY_CARE_PROVIDER_SITE_OTHER): Payer: Medicare HMO | Admitting: Internal Medicine

## 2019-08-15 ENCOUNTER — Other Ambulatory Visit: Payer: Self-pay

## 2019-08-15 DIAGNOSIS — I480 Paroxysmal atrial fibrillation: Secondary | ICD-10-CM

## 2019-08-15 NOTE — Progress Notes (Signed)
No answer.  Left VM  Will need to reschedule for a day that she is available.

## 2019-08-16 ENCOUNTER — Other Ambulatory Visit: Payer: Self-pay | Admitting: Family Medicine

## 2019-08-29 ENCOUNTER — Telehealth (INDEPENDENT_AMBULATORY_CARE_PROVIDER_SITE_OTHER): Payer: Medicare HMO | Admitting: Internal Medicine

## 2019-08-29 ENCOUNTER — Encounter: Payer: Self-pay | Admitting: Internal Medicine

## 2019-08-29 ENCOUNTER — Other Ambulatory Visit: Payer: Self-pay

## 2019-08-29 VITALS — BP 116/64 | HR 72 | Ht 61.0 in | Wt 117.2 lb

## 2019-08-29 DIAGNOSIS — I10 Essential (primary) hypertension: Secondary | ICD-10-CM

## 2019-08-29 DIAGNOSIS — I480 Paroxysmal atrial fibrillation: Secondary | ICD-10-CM

## 2019-08-29 NOTE — Progress Notes (Signed)
Electrophysiology TeleHealth Note  Due to national recommendations of social distancing due to [place] 19, an audio telehealth visit is felt to be most appropriate for this patient at this time.  Verbal consent was obtained by me for the telehealth visit today.  The patient does not have capability for a virtual visit.  A phone visit is therefore required today.   Date:  08/29/2019   ID:  Alexandria Mcdowell, DOB 11-28-1940, MRN [id number]  Location: patient's home  Provider location:  [facility]  Evaluation Performed: Follow-up visit  PCP:  Jinny Sanders, MD   Electrophysiologist:  Dr Rayann Heman  Chief Complaint:  palpitations  History of Present Illness:    Alexandria Mcdowell is a 78 y.o. female who presents via telehealth conferencing today.  Since last being seen in our clinic, the patient reports doing very well.  Today, she denies symptoms of palpitations, chest pain, shortness of breath,  lower extremity edema, dizziness, presyncope, or syncope.  The patient is otherwise without complaint today.  The patient denies symptoms of fevers, chills, cough, or new SOB worrisome for COVID 19.  Past Medical History:  Diagnosis Date  . Allergy   . Anemia   . Hyperlipidemia   . Hypertension   . Migraine   . Paroxysmal atrial fibrillation [facility])     Past Surgical History:  Procedure Laterality Date  . APPENDECTOMY    . ATRIAL FIBRILLATION ABLATION N/A 02/08/2019   Procedure: ATRIAL FIBRILLATION ABLATION;  Surgeon: Thompson Grayer, MD;  Location: [place] CV LAB;  Service: Cardiovascular;  Laterality: N/A;  . BREAST BIOPSY    . CARDIOVERSION N/A 12/29/2018   Procedure: CARDIOVERSION;  Surgeon: Dorothy Spark, MD;  Location: [facility] ENDOSCOPY;  Service: Cardiovascular;  Laterality: N/A;  . CATARACT EXTRACTION  05/05/13   right eye   . COLONOSCOPY  2006  . [place]  . WISDOM TOOTH EXTRACTION      Current Outpatient Medications  Medication Sig Dispense  Refill  . alendronate (FOSAMAX) 35 MG tablet Take 35 mg by mouth every Tuesday. Take with a full glass of water on an empty stomach.     Marland Kitchen apixaban (ELIQUIS) 5 MG TABS tablet Take 1 tablet (5 mg total) by mouth 2 (two) times daily. 180 tablet 2  . atorvastatin (LIPITOR) 10 MG tablet TAKE 1 TABLET (10 MG TOTAL) BY MOUTH DAILY. 90 tablet 3  . Cholecalciferol (D3 VITAMIN PO) Take 10 mcg by mouth 2 (two) times daily.    Marland Kitchen diltiazem (CARDIZEM CD) 240 MG 24 hr capsule Take 1 capsule (240 mg total) by mouth daily. 90 capsule 3  . diltiazem (CARDIZEM) 30 MG tablet Take 1 tablet (30 mg total) by mouth as needed (afib). 90 tablet 2  . flecainide (TAMBOCOR) 50 MG tablet Take 1 tablet (50 mg total) by mouth 2 (two) times daily. 180 tablet 2  . lisinopril-hydrochlorothiazide (ZESTORETIC) 10-12.5 MG tablet TAKE 1 TABLET EVERY DAY 90 tablet 1  . Multiple Vitamins-Minerals (MULTIVITAMIN WITH MINERALS) tablet Take 1 tablet by mouth daily with supper.     . Omega-3 Fatty Acids (FISH OIL) 1200 MG CAPS Take 1,200 mg by mouth daily with supper.     Vladimir Faster Glycol-Propyl Glycol (LUBRICANT EYE DROPS) 0.4-0.3 % SOLN Place 1 drop into both eyes 3 (three) times daily as needed (for dry/irritated eyes.).    Marland Kitchen sodium chloride (OCEAN) 0.65 % SOLN nasal spray Place 1 spray into both nostrils 4 (four) times  daily as needed for congestion (allergies.).      No current facility-administered medications for this visit.    Allergies:   Clindamycin hcl, Codeine, Erythromycin, Tetracycline, Penicillins, and Sulfonamide derivatives   Social History:  The patient  reports that she has never smoked. She has never used smokeless tobacco. She reports that she does not drink alcohol or use drugs.   Family History:  The patient's family history includes Cancer in her mother; Diabetes in her father; Emphysema in her father; Heart disease in her mother; Hyperlipidemia in her mother and sister; Hypertension in her mother and sister;  Hypothyroidism in her sister.   ROS:  Please see the history of present illness.   All other systems are personally reviewed and negative.    Exam:    Vital Signs:  BP 116/64   Pulse 72   Ht 5\' 1"  (1.549 m)   Wt 117 lb 3.2 oz (53.2 kg)   BMI 22.14 kg/m   Well sounding, alert and conversant   Labs/Other Tests and Data Reviewed:    Recent Labs: 12/28/2018: TSH 0.732 01/04/2019: Magnesium 1.8 01/25/2019: Hemoglobin 14.8; Platelets 299 07/01/2019: ALT 21; BUN 19; Creatinine, Ser 0.87; Potassium 4.0; Sodium 139   Wt Readings from Last 3 Encounters:  08/29/19 117 lb 3.2 oz (53.2 kg)  07/08/19 121 lb 2 oz (54.9 kg)  05/11/19 122 lb (55.3 kg)     ASSESSMENT & PLAN:    1.  Paroxysmal atrial fibrillation Doing great post ablation  She will consider stopping flecainide as she is having no arrhythmias post ablation.   chads2vasc score is 4.  She wishes to stay on eliquis  2. HTN Stable No change required today  Follow-up: 4 months with me   Patient Risk:  after full review of this patients clinical status, I feel that they are at moderate risk at this time.  Today, I have spent 15 minutes with the patient with telehealth technology discussing arrhythmia management .    Army Fossa, MD  08/29/2019 10:13 AM     [facility] [address] [place] [address] [phone] (office) [phone] (fax)

## 2019-10-02 ENCOUNTER — Ambulatory Visit: Payer: Medicare Other | Attending: Internal Medicine

## 2019-10-02 DIAGNOSIS — Z23 Encounter for immunization: Secondary | ICD-10-CM | POA: Insufficient documentation

## 2019-10-02 NOTE — Progress Notes (Signed)
   Covid-19 Vaccination Clinic  Name:  Alexandria Mcdowell    MRN: [id number] DOB: 1941-03-04  10/02/2019  Alexandria Mcdowell was observed post Covid-19 immunization for 30 MINS without incidence. She was provided with Vaccine Information Sheet and instruction to access the V-Safe system.   Alexandria Mcdowell was instructed to call 911 with any severe reactions post vaccine: Marland Kitchen Difficulty breathing  . Swelling of your face and throat  . A fast heartbeat  . A bad rash all over your body  . Dizziness and weakness

## 2019-10-12 ENCOUNTER — Other Ambulatory Visit: Payer: Self-pay | Admitting: Internal Medicine

## 2019-10-18 ENCOUNTER — Other Ambulatory Visit: Payer: Self-pay | Admitting: Dermatology

## 2019-10-18 DIAGNOSIS — L570 Actinic keratosis: Secondary | ICD-10-CM | POA: Diagnosis not present

## 2019-10-18 DIAGNOSIS — C44319 Basal cell carcinoma of skin of other parts of face: Secondary | ICD-10-CM | POA: Diagnosis not present

## 2019-10-18 DIAGNOSIS — C44519 Basal cell carcinoma of skin of other part of trunk: Secondary | ICD-10-CM | POA: Diagnosis not present

## 2019-10-18 DIAGNOSIS — D0461 Carcinoma in situ of skin of right upper limb, including shoulder: Secondary | ICD-10-CM | POA: Diagnosis not present

## 2019-10-23 ENCOUNTER — Ambulatory Visit: Payer: Medicare HMO | Attending: Internal Medicine

## 2019-10-23 DIAGNOSIS — Z23 Encounter for immunization: Secondary | ICD-10-CM | POA: Insufficient documentation

## 2019-10-23 NOTE — Progress Notes (Signed)
   Covid-19 Vaccination Clinic  Name:  [name]    MRN: [id number] DOB: [birth year]  10/23/2019  [name] was observed post Covid-19 immunization for 15 minutes without incidence. She was provided with Vaccine Information Sheet and instruction to access the V-Safe system.   [name] was instructed to call 911 with any severe reactions post vaccine: Marland Kitchen Difficulty breathing  . Swelling of your face and throat  . A fast heartbeat  . A bad rash all over your body  . Dizziness and weakness    Immunizations Administered    Name Date Dose VIS Date Route   Pfizer COVID-19 Vaccine 10/23/2019 10:02 AM 0.3 mL 08/26/2019 Intramuscular   Manufacturer: [employer]   Lot: [id number]   [place]: [id number]

## 2019-11-24 ENCOUNTER — Other Ambulatory Visit: Payer: Self-pay | Admitting: Dermatology

## 2019-11-24 DIAGNOSIS — L7682 Other postprocedural complications of skin and subcutaneous tissue: Secondary | ICD-10-CM | POA: Diagnosis not present

## 2019-11-24 DIAGNOSIS — C44519 Basal cell carcinoma of skin of other part of trunk: Secondary | ICD-10-CM | POA: Diagnosis not present

## 2019-11-30 ENCOUNTER — Other Ambulatory Visit: Payer: Self-pay

## 2019-11-30 ENCOUNTER — Ambulatory Visit (INDEPENDENT_AMBULATORY_CARE_PROVIDER_SITE_OTHER): Payer: Medicare HMO | Admitting: *Deleted

## 2019-11-30 DIAGNOSIS — C44310 Basal cell carcinoma of skin of unspecified parts of face: Secondary | ICD-10-CM

## 2019-11-30 NOTE — Progress Notes (Signed)
Path to patient. Removed  sutures. Spot looks good, no signs of infection.

## 2019-12-19 ENCOUNTER — Telehealth: Payer: Self-pay | Admitting: Internal Medicine

## 2019-12-19 ENCOUNTER — Telehealth (INDEPENDENT_AMBULATORY_CARE_PROVIDER_SITE_OTHER): Payer: Medicare HMO | Admitting: Internal Medicine

## 2019-12-19 ENCOUNTER — Other Ambulatory Visit: Payer: Self-pay

## 2019-12-19 DIAGNOSIS — I10 Essential (primary) hypertension: Secondary | ICD-10-CM

## 2019-12-19 DIAGNOSIS — D6869 Other thrombophilia: Secondary | ICD-10-CM | POA: Diagnosis not present

## 2019-12-19 DIAGNOSIS — I480 Paroxysmal atrial fibrillation: Secondary | ICD-10-CM | POA: Diagnosis not present

## 2019-12-19 NOTE — Progress Notes (Signed)
Electrophysiology TeleHealth Note  Due to national recommendations of social distancing due to [place] 19, an audio telehealth visit is felt to be most appropriate for this patient at this time.  Verbal consent was obtained by me for the telehealth visit today.  The patient does not have capability for a virtual visit.  A phone visit is therefore required today.   Date:  12/19/2019   ID:  [name], DOB 1941/06/14, MRN [id number]  Location: patient's home  Provider location:  [place]  Evaluation Performed: Follow-up visit  PCP:  Jinny Sanders, MD   Electrophysiologist:  Dr Rayann Heman  Chief Complaint:  palpitations  History of Present Illness:    Alexandria Mcdowell is a 79 y.o. female who presents via telehealth conferencing today.  Since last being seen in our clinic, the patient reports doing very well.  Today, she denies symptoms of palpitations, chest pain, shortness of breath,  lower extremity edema, dizziness, presyncope, or syncope.  The patient is otherwise without complaint today.     Past Medical History:  Diagnosis Date  . Allergy   . Anemia   . Hyperlipidemia   . Hypertension   . Migraine   . Paroxysmal atrial fibrillation [facility])     Past Surgical History:  Procedure Laterality Date  . APPENDECTOMY    . ATRIAL FIBRILLATION ABLATION N/A 02/08/2019   Procedure: ATRIAL FIBRILLATION ABLATION;  Surgeon: Thompson Grayer, MD;  Location: [place] [facility];  Service: Cardiovascular;  Laterality: N/A;  . BREAST BIOPSY    . CARDIOVERSION N/A 12/29/2018   Procedure: CARDIOVERSION;  Surgeon: Dorothy Spark, MD;  Location: [facility] ENDOSCOPY;  Service: Cardiovascular;  Laterality: N/A;  . CATARACT EXTRACTION  05/05/13   right eye   . COLONOSCOPY  2006  . Alligator  . WISDOM TOOTH EXTRACTION      Current Outpatient Medications  Medication Sig Dispense Refill  . alendronate (FOSAMAX) 35 MG tablet Take 35 mg by mouth every Tuesday. Take with a  full glass of water on an empty stomach.     Marland Kitchen apixaban (ELIQUIS) 5 MG TABS tablet Take 1 tablet (5 mg total) by mouth 2 (two) times daily. 180 tablet 2  . atorvastatin (LIPITOR) 10 MG tablet TAKE 1 TABLET (10 MG TOTAL) BY MOUTH DAILY. 90 tablet 3  . Cholecalciferol (D3 VITAMIN PO) Take 10 mcg by mouth 2 (two) times daily.    Marland Kitchen diltiazem (CARDIZEM CD) 240 MG 24 hr capsule Take 1 capsule (240 mg total) by mouth daily. 90 capsule 3  . diltiazem (CARDIZEM) 30 MG tablet Take 1 tablet (30 mg total) by mouth as needed (afib). 90 tablet 2  . flecainide (TAMBOCOR) 50 MG tablet TAKE 1 TABLET (50 MG TOTAL) BY MOUTH 2 (TWO) TIMES DAILY. 180 tablet 2  . lisinopril-hydrochlorothiazide (ZESTORETIC) 10-12.5 MG tablet TAKE 1 TABLET EVERY DAY 90 tablet 1  . Multiple Vitamins-Minerals (MULTIVITAMIN WITH MINERALS) tablet Take 1 tablet by mouth daily with supper.     . Omega-3 Fatty Acids (FISH OIL) 1200 MG CAPS Take 1,200 mg by mouth daily with supper.     Vladimir Faster Glycol-Propyl Glycol (LUBRICANT EYE DROPS) 0.4-0.3 % SOLN Place 1 drop into both eyes 3 (three) times daily as needed (for dry/irritated eyes.).    Marland Kitchen sodium chloride (OCEAN) 0.65 % SOLN nasal spray Place 1 spray into both nostrils 4 (four) times daily as needed for congestion (allergies.).      No current  facility-administered medications for this visit.    Allergies:   Clindamycin hcl, Codeine, Erythromycin, Tetracycline, Penicillins, and Sulfonamide derivatives   Social History:  The patient  reports that she has never smoked. She has never used smokeless tobacco. She reports that she does not drink alcohol or use drugs.   Family History:  The patient's family history includes Cancer in her mother; Diabetes in her father; Emphysema in her father; Heart disease in her mother; Hyperlipidemia in her mother and sister; Hypertension in her mother and sister; Hypothyroidism in her sister.   ROS:  Please see the history of present illness.   All other  systems are personally reviewed and negative.    Exam:    Vital Signs:  130/63, HR 77  Well sounding, alert and conversant   Labs/Other Tests and Data Reviewed:    Recent Labs: 12/28/2018: TSH 0.732 01/04/2019: Magnesium 1.8 01/25/2019: Hemoglobin 14.8; Platelets 299 07/01/2019: ALT 21; BUN 19; Creatinine, Ser 0.87; Potassium 4.0; Sodium 139   Wt Readings from Last 3 Encounters:  08/29/19 117 lb 3.2 oz (53.2 kg)  07/08/19 121 lb 2 oz (54.9 kg)  05/11/19 122 lb (55.3 kg)     ASSESSMENT & PLAN:    1.  Paroxysmal atrial fibrillation Doing well post ablation without recurrence She wishes to continue to stay on flecainide, though I suspect she could discontinue this. She has prn diltiazem which she rarely takes. chads2vasc score is 4.  Continue eliquis  2. HTN Stable No change required today   Follow-up:  6 months with AF clinic I will see in a year   Patient Risk:  after full review of this patients clinical status, I feel that they are at moderate risk at this time.  Today, I have spent 15 minutes with the patient with telehealth technology discussing arrhythmia management .    Army Fossa, MD  12/19/2019 12:26 PM     [address] [place] [id number] [phone] (office) [phone] (fax)

## 2019-12-19 NOTE — Telephone Encounter (Signed)
Alexandria Mcdowell has spoken with family

## 2019-12-19 NOTE — Telephone Encounter (Signed)
Alexandria Mcdowell, [name] came into the offic etoday. She said that she received different messages on mychart about her appt and she was confused. She says that she will be late getting home for her virtual appt because she came to the office

## 2019-12-29 ENCOUNTER — Encounter: Payer: Self-pay | Admitting: Dermatology

## 2019-12-29 ENCOUNTER — Other Ambulatory Visit: Payer: Self-pay

## 2019-12-29 ENCOUNTER — Ambulatory Visit (INDEPENDENT_AMBULATORY_CARE_PROVIDER_SITE_OTHER): Payer: Medicare HMO | Admitting: Dermatology

## 2019-12-29 DIAGNOSIS — D0461 Carcinoma in situ of skin of right upper limb, including shoulder: Secondary | ICD-10-CM

## 2019-12-29 DIAGNOSIS — D099 Carcinoma in situ, unspecified: Secondary | ICD-10-CM

## 2019-12-29 NOTE — Progress Notes (Addendum)
   Follow-Up Visit   Subjective  Alexandria Mcdowell is a 79 y.o. female who presents for the following: Procedure (Patient here today for treatment CIS x 1 Right Upper Arm).  CIS Location: Right arm Duration:  Quality:  Associated Signs/Symptoms: Modifying Factors:  Severity:  Timing: Context: For treatment  The following portions of the chart were reviewed this encounter and updated as appropriate:     Objective  Well appearing patient in no apparent distress; mood and affect are within normal limits.  A focused examination was performed including head, extremities.. Relevant physical exam findings are noted in the Assessment and Plan.   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  Destruction of lesion Complexity: simple   Destruction method: electrodesiccation and curettage   Informed consent: discussed and consent obtained   Timeout:  patient name, date of birth, surgical site, and procedure verified Anesthesia: the lesion was anesthetized in a standard fashion   Anesthetic:  1% lidocaine w/ epinephrine 1-100,000 local infiltration Curettage performed in three different directions: Yes   Curettage cycles:  3 Lesion length (cm):  1.4 Margin per side (cm):  0 Hemostasis achieved with:  ferric subsulfate Outcome: patient tolerated procedure well with no complications   Post-procedure details: wound care instructions given   Additional details:  Inoculated with parenteral 5% fluorouracil  Lidocaine-epi anesthetic, curet x3, wide>deep (cauterize and re-curet one 43mm deeper pocket), base inoculated with parenteral 5FU. 1.4cm Right upper arm - 1.8cm

## 2020-01-23 ENCOUNTER — Other Ambulatory Visit: Payer: Self-pay | Admitting: Family Medicine

## 2020-02-02 ENCOUNTER — Other Ambulatory Visit (HOSPITAL_COMMUNITY): Payer: Self-pay | Admitting: *Deleted

## 2020-02-02 MED ORDER — APIXABAN 5 MG PO TABS: ORAL_TABLET | ORAL | 2 refills | Status: DC

## 2020-04-22 IMAGING — DX PORTABLE CHEST - 1 VIEW
1 series · 1 of 1 positions shown · non-contrast
Comparison: 06/07/2014

CLINICAL DATA: EKG with ST changes and tachycardia.  Palpitations.

EXAM:
PORTABLE CHEST 1 VIEW

[chest]
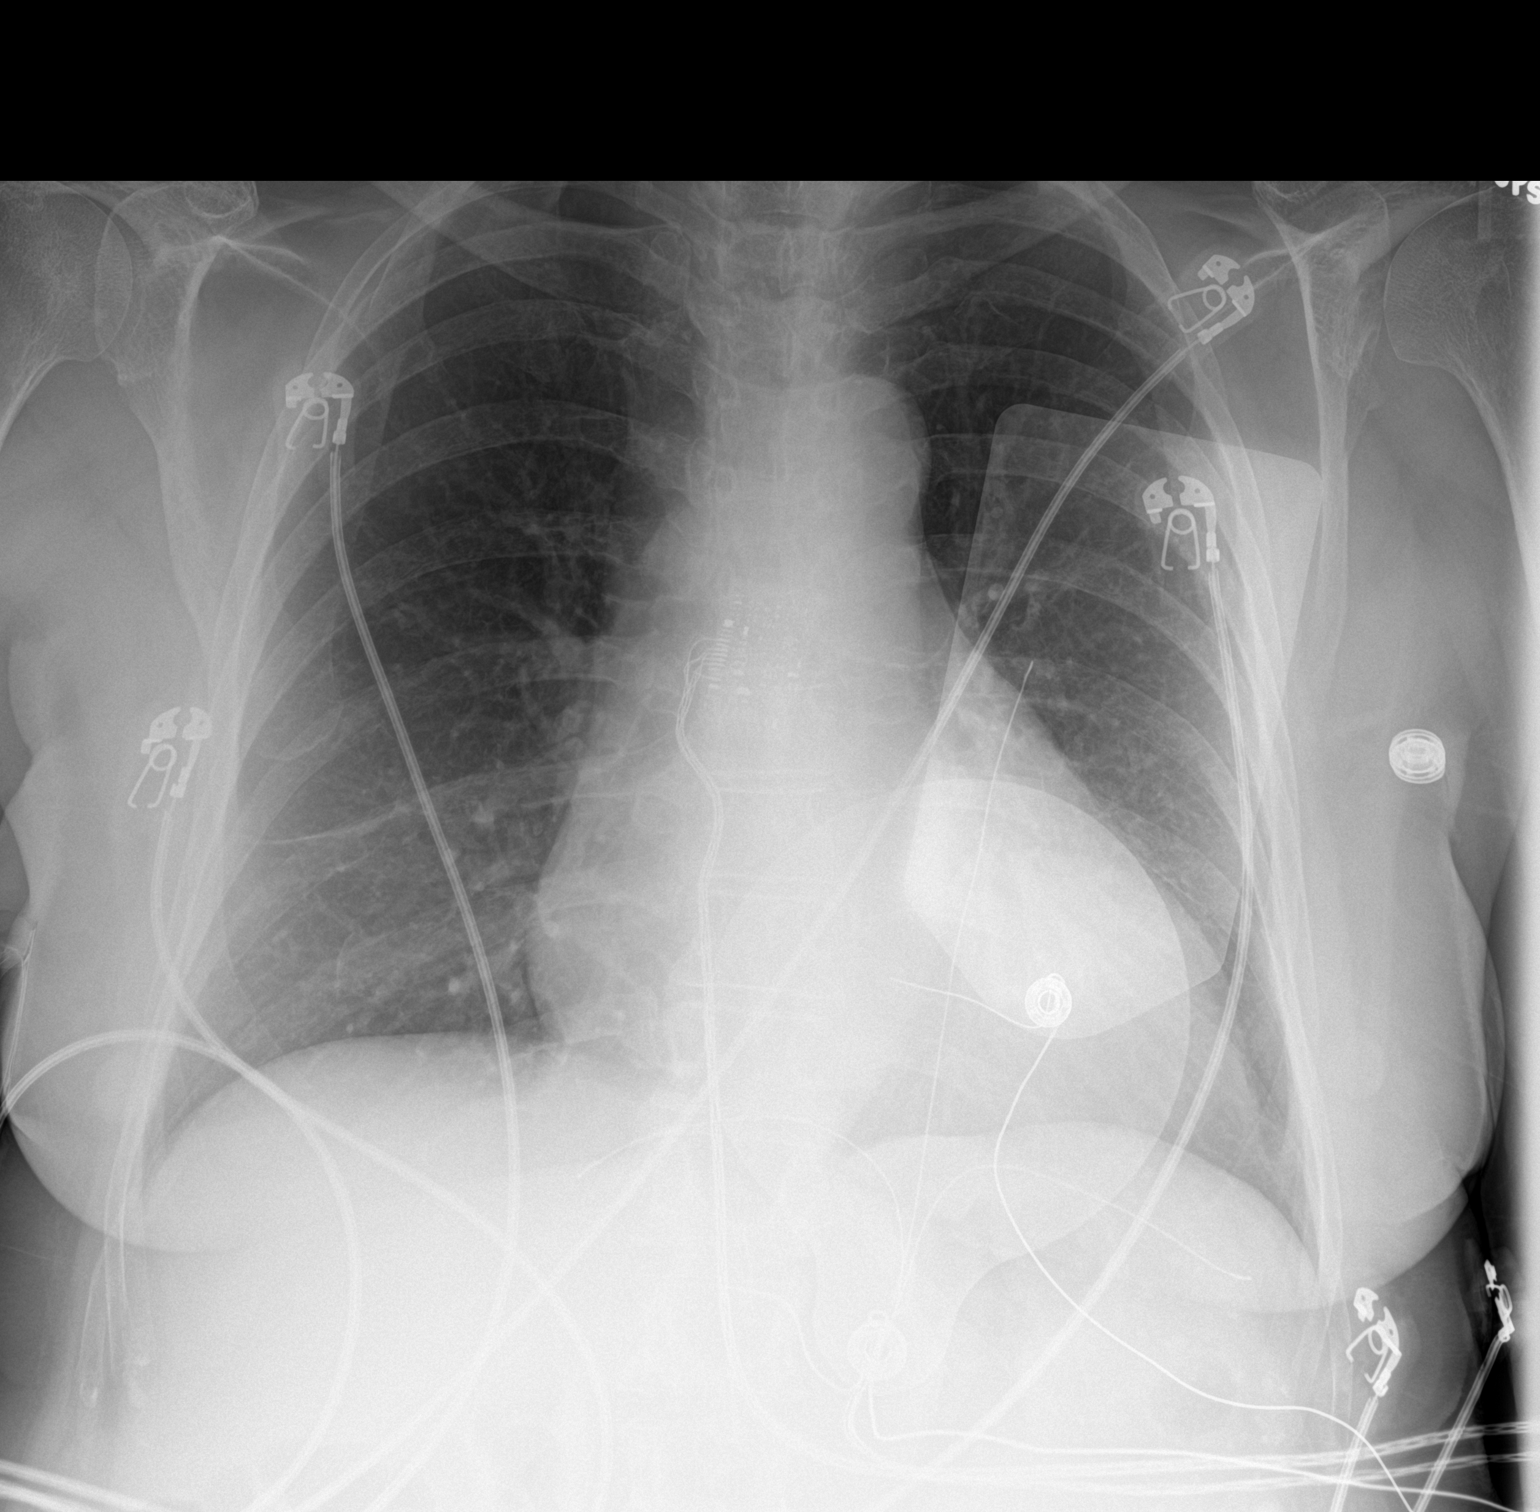

[1 of 1 positions shown; findings below may reference images not displayed]

FINDINGS: Cardiac silhouette is mildly enlarged. No mediastinal or hilar
masses. No evidence of adenopathy.

Mild linear scarring in the right middle lobe, stable. Lungs are
otherwise clear. No pleural effusion or pneumothorax.

Skeletal structures are grossly intact.
IMPRESSION: No active disease.

## 2020-05-16 ENCOUNTER — Telehealth: Payer: Self-pay | Admitting: *Deleted

## 2020-05-16 NOTE — Telephone Encounter (Signed)
Patient called stating that she thinks that she qualifies for the third covid vaccine now. Patient wants to confirm that is correct with Dr. Diona Browner. Patient wants to know if she should go ahead and take the covid vaccine now or get the flu vaccine first.

## 2020-05-16 NOTE — Telephone Encounter (Signed)
Alexandria Mcdowell notified as instructed by telephone.  Patient states understanding.

## 2020-05-16 NOTE — Telephone Encounter (Signed)
This is the latest info I have:  The CDC now recommends that those who are moderately to severely immunocompromised should receive an additional dose of mRNA COVID-10 vaccine after the initial two doses. They recommend that these individuals receive an additional dose at least 28 days after the second dose of the Pfizer or Moderna vaccine (Note - this does NOT apply to patients who received the J&J vaccine).   Who needs an additional COVID-19 vaccine?  People who have:  " Been receiving active cancer treatment for tumors or cancers of the blood  " Received an organ transplant and are taking medicine to suppress the immune system  " Received a stem cell transplant within the last 2 years or are taking medicine to suppress the immune system  " Moderate or severe primary immunodeficiency (such as DiGeorge syndrome, Wiskott-Aldrich syndrome)  " Advanced or untreated HIV infection  " Active treatment with high-dose corticosteroids or other drugs that may suppress your immune response  For people who received either the [place] or Moderna vaccine series, a third dose of the same mRNA vaccine should be used. If the mRNA vaccine product given for the first two doses is not available or is unknown, either mRNA vaccine product may be administered. For more information, please see this site: COVID-19 Vaccines for Moderately to Severely Immunocompromised People   CDC   Action Requested:  " For those sites administering COVID-19 vaccines, please offer a booster dose to those who meet the criteria above. Unless otherwise instructed, do not offer a booster dose to anyone else at this time.    SO... NO she does not yet qualify... likely will open to general public other high risk groups soon.  Get flu vaccine now first.

## 2020-05-26 ENCOUNTER — Other Ambulatory Visit: Payer: Self-pay

## 2020-05-26 ENCOUNTER — Ambulatory Visit (INDEPENDENT_AMBULATORY_CARE_PROVIDER_SITE_OTHER): Payer: Medicare HMO

## 2020-05-26 DIAGNOSIS — Z23 Encounter for immunization: Secondary | ICD-10-CM

## 2020-05-26 IMAGING — DX CHEST - 2 VIEW
2 series · 2 of 2 positions shown · non-contrast
Comparison: 01/04/2019

CLINICAL DATA: Irregular heartbeat.

EXAM:
CHEST - 2 VIEW

[chest pa]
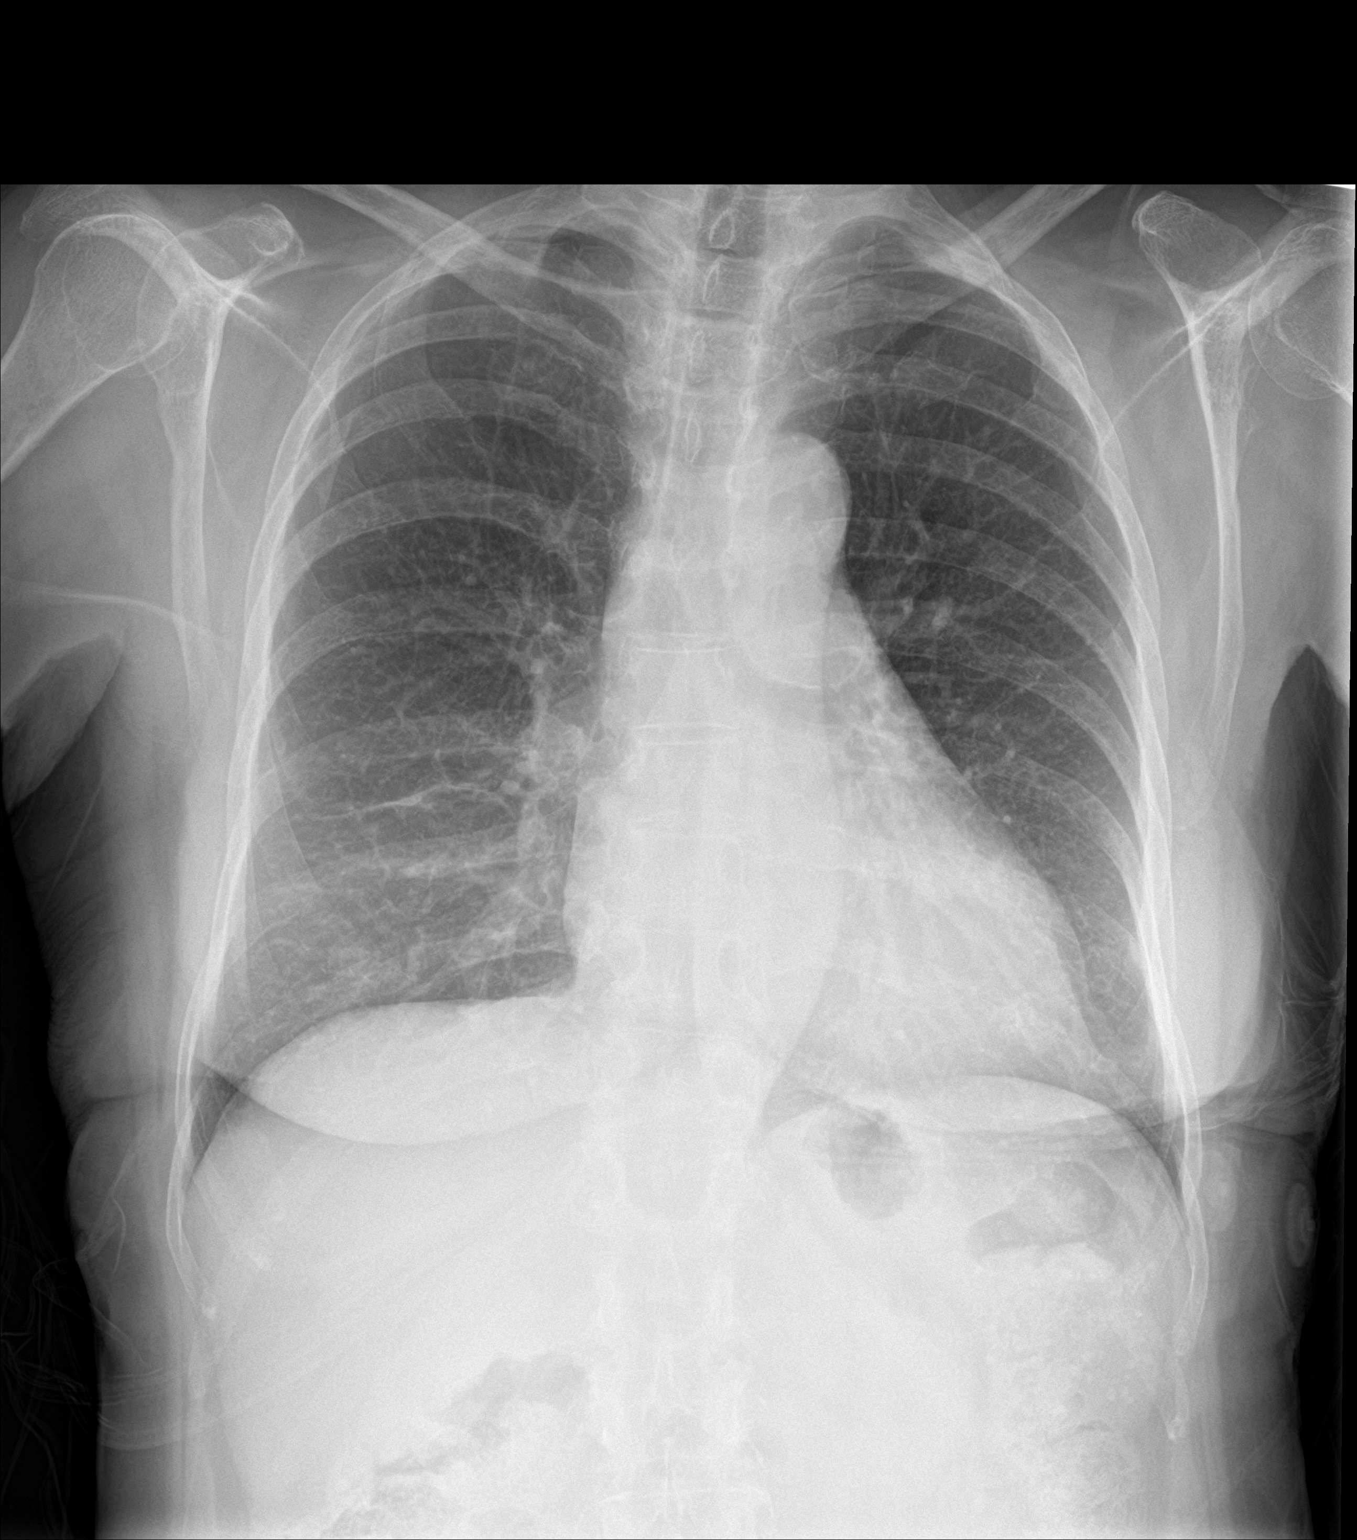

[chest lat]
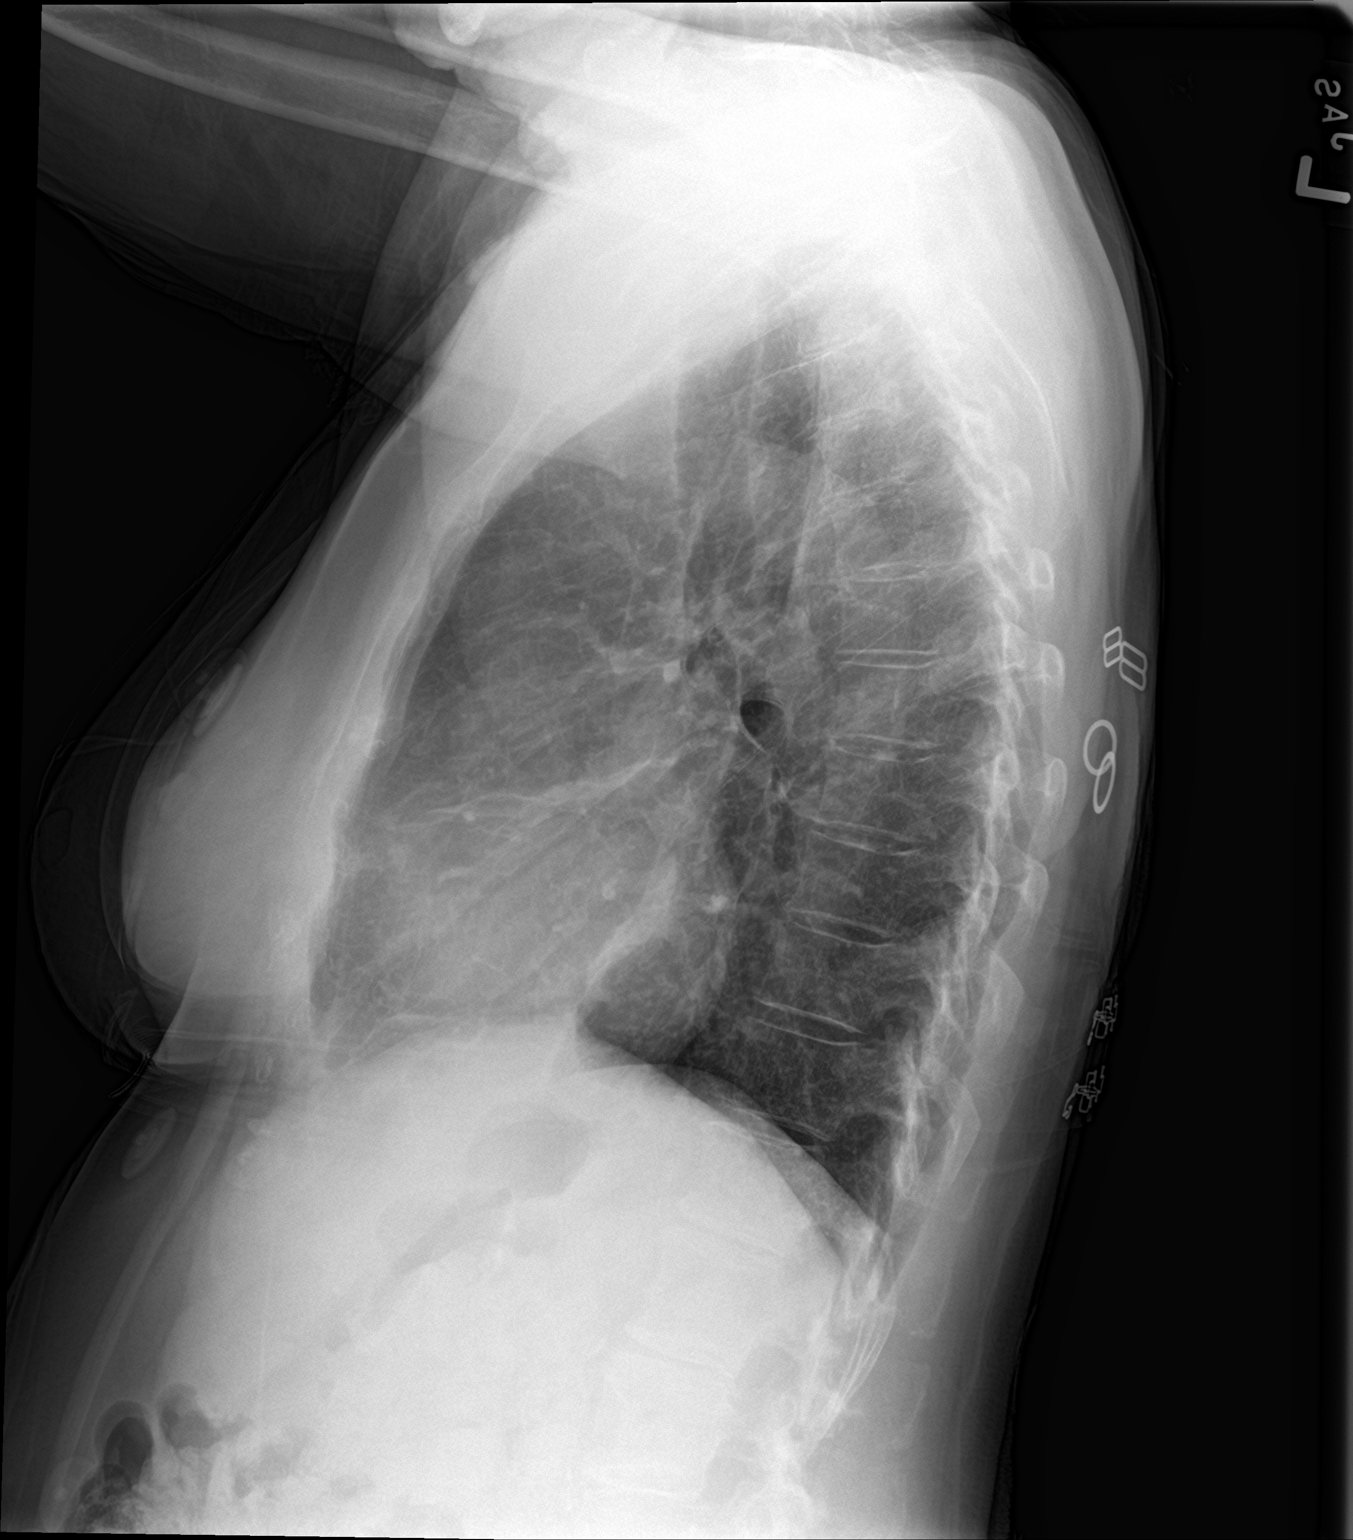

[2 of 2 positions shown; findings below may reference images not displayed]

FINDINGS: The heart size and mediastinal contours are within normal limits.
Both lungs are clear. The visualized skeletal structures are
unremarkable.
IMPRESSION: Clear lungs.

## 2020-05-28 ENCOUNTER — Other Ambulatory Visit: Payer: Self-pay | Admitting: Family Medicine

## 2020-06-01 ENCOUNTER — Telehealth: Payer: Self-pay | Admitting: Family Medicine

## 2020-06-01 NOTE — Telephone Encounter (Signed)
Called pt numerous time to r/s appt with [facility], but someone keeps hanging up on me.

## 2020-06-13 ENCOUNTER — Telehealth: Payer: Self-pay | Admitting: *Deleted

## 2020-06-13 NOTE — Telephone Encounter (Addendum)
   Primary Cardiologist: Jenkins Rouge, MD  Chart reviewed as part of pre-operative protocol coverage. Patient was contacted 06/13/2020 in reference to pre-operative risk assessment for pending surgery as outlined below.  Alexandria Mcdowell was last seen on 12/2019 by Dr. Rayann Heman with history of PAF, HTN, HLD, anemia, allergy, migraine per chart.  She has an upcoming appointment with Adline Peals in the atrial fib clinic on 06/20/20 which we will await, in case there are any findings that deem it necessary to avoid interruption in anticoagulation or procedure. I add pre-op notation to appointment notes.  I will also route this message to the pharmacy team so that their input on how long to hold will be available at that appointment.  Will fax to the requesting provider to make them aware an appointment is pending.  Charlie Pitter, PA-C 06/13/2020, 12:22 PM

## 2020-06-13 NOTE — Telephone Encounter (Signed)
Patient with diagnosis of afib on Eliquis for anticoagulation.    Procedure: colonoscopy Date of procedure: 08/08/20  CHADS2-VASc score of 4 (age x2, sex, HTN)  CrCl 62mL/min Platelet count 229K  Per office protocol, patient can hold Eliquis for 1-2 days prior to procedure.

## 2020-06-13 NOTE — Telephone Encounter (Addendum)
Update: discussed with Ricky with afib clinic. They do not typically address the medical aspect of these clearances but he will evaluate patient for her atrial fib and provide recommendations on whether OK to interrupt anticoagulation for clearance. He plans to cc his note to the preop box for review and then our team can call the patient for review. This clearance will remain in box until after OV 06/20/20. Romel Dumond PA-C

## 2020-06-13 NOTE — Telephone Encounter (Signed)
   [facility] Pre-operative Risk Assessment    HEARTCARE STAFF: - Please ensure there is not already an duplicate clearance open for this procedure. - Under Visit Info/Reason for Call, type in Other and utilize the format Clearance MM/DD/YY or Clearance TBD. Do not use dashes or single digits. - If request is for dental extraction, please clarify the # of teeth to be extracted.  Request for surgical clearance:  1. What type of surgery is being performed? COLONOSCOPY   2. When is this surgery scheduled? 08/08/20   3. What type of clearance is required (medical clearance vs. Pharmacy clearance to hold med vs. Both)? BOTH  4. Are there any medications that need to be held prior to surgery and how long? ELIQUIS   5. Practice name and name of physician performing surgery? [facility]; DR. Therisa Doyne   6. What is the office phone number? [phone]   7.   What is the office fax number? [phone]  8.   Anesthesia type (None, local, MAC, general) ? PROPOFOL   Julaine Hua 06/13/2020, 10:36 AM  _________________________________________________________________   (provider comments below)

## 2020-06-15 ENCOUNTER — Other Ambulatory Visit: Payer: Self-pay | Admitting: Family Medicine

## 2020-06-18 DIAGNOSIS — H524 Presbyopia: Secondary | ICD-10-CM | POA: Diagnosis not present

## 2020-06-20 ENCOUNTER — Other Ambulatory Visit: Payer: Self-pay

## 2020-06-20 ENCOUNTER — Ambulatory Visit (HOSPITAL_COMMUNITY)
Admission: RE | Admit: 2020-06-20 | Discharge: 2020-06-20 | Disposition: A | Discharge status: Home / Self Care | Payer: Medicare HMO | Source: Ambulatory Visit | Attending: Physician Assistant | Admitting: Physician Assistant

## 2020-06-20 ENCOUNTER — Encounter (HOSPITAL_COMMUNITY): Payer: Self-pay | Admitting: Physician Assistant

## 2020-06-20 VITALS — BP 132/64 | HR 70 | Ht 61.0 in | Wt 125.2 lb

## 2020-06-20 DIAGNOSIS — I10 Essential (primary) hypertension: Secondary | ICD-10-CM | POA: Insufficient documentation

## 2020-06-20 DIAGNOSIS — Z79899 Other long term (current) drug therapy: Secondary | ICD-10-CM | POA: Insufficient documentation

## 2020-06-20 DIAGNOSIS — E785 Hyperlipidemia, unspecified: Secondary | ICD-10-CM | POA: Diagnosis not present

## 2020-06-20 DIAGNOSIS — Z833 Family history of diabetes mellitus: Secondary | ICD-10-CM | POA: Insufficient documentation

## 2020-06-20 DIAGNOSIS — Z888 Allergy status to other drugs, medicaments and biological substances status: Secondary | ICD-10-CM | POA: Diagnosis not present

## 2020-06-20 DIAGNOSIS — Z7901 Long term (current) use of anticoagulants: Secondary | ICD-10-CM | POA: Diagnosis not present

## 2020-06-20 DIAGNOSIS — Z882 Allergy status to sulfonamides status: Secondary | ICD-10-CM | POA: Diagnosis not present

## 2020-06-20 DIAGNOSIS — Z85828 Personal history of other malignant neoplasm of skin: Secondary | ICD-10-CM | POA: Insufficient documentation

## 2020-06-20 DIAGNOSIS — I480 Paroxysmal atrial fibrillation: Secondary | ICD-10-CM | POA: Insufficient documentation

## 2020-06-20 DIAGNOSIS — Z880 Allergy status to penicillin: Secondary | ICD-10-CM | POA: Insufficient documentation

## 2020-06-20 DIAGNOSIS — Z8249 Family history of ischemic heart disease and other diseases of the circulatory system: Secondary | ICD-10-CM | POA: Insufficient documentation

## 2020-06-20 DIAGNOSIS — D6869 Other thrombophilia: Secondary | ICD-10-CM | POA: Diagnosis not present

## 2020-06-20 NOTE — Telephone Encounter (Signed)
Pt and pt's husband called back and scheduled for 08/14/20

## 2020-06-20 NOTE — Progress Notes (Signed)
Primary Care Physician: Jinny Sanders, MD Primary Cardiologist: Dr Johnsie Cancel (remotely) Primary Electrophysiologist: Dr Rayann Heman Referring Physician: [name] is a 79 y.o. female with a history of paroxysmal atrial fibrillation, HTN, HLD who presents for follow up in the [facility]. She is s/p ablation with Dr Rayann Heman on 02/08/19. She reports that she has done well since her last visit with rare episodes of palpitations that resolve quickly with PRN diltiazem. She denies any bleeding issues on anticoagulation.   Today, she denies symptoms of palpitations, chest pain, shortness of breath, orthopnea, PND, lower extremity edema, dizziness, presyncope, syncope, snoring, daytime somnolence, bleeding, or neurologic sequela. The patient is tolerating medications without difficulties and is otherwise without complaint today.    Atrial Fibrillation Risk Factors:  she does not have symptoms or diagnosis of sleep apnea. she does not have a history of rheumatic fever. she does not have a history of alcohol use. The patient does not have a history of early familial atrial fibrillation or other arrhythmias.  she has a BMI of Body mass index is 23.66 kg/m.Marland Kitchen Filed Weights   06/20/20 0847  Weight: 56.8 kg    Family History  Problem Relation Age of Onset   Hypertension Mother    Hyperlipidemia Mother    Cancer Mother        breast/colon   Heart disease Mother    Diabetes Father    Emphysema Father    Hyperlipidemia Sister    Hypertension Sister    Hypothyroidism Sister      Atrial Fibrillation Management history:  Previous antiarrhythmic drugs: flecainide Previous cardioversions: 12/29/18, 01/04/19 Previous ablations: 02/08/19 CHADS2VASC score: 4 (female, age, HTN) Anticoagulation history: Eliquis    Past Medical History:  Diagnosis Date   Allergy    Anemia    Atypical mole 10/20/2001   Left Mid to Upper Back-Slight to  Moderate(no treatment)   BCC (basal cell carcinoma of skin) 02/07/2004   Left Shin(excision) Dr. 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 Tonia Brooms   SCCA (squamous cell carcinoma) of skin 08/03/2000   Left Shin([place]) Dr. 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 Tonia Brooms   SCCA (squamous cell carcinoma) of skin 09/17/2000   Right Forearm([place]) Dr. Susa Simmonds   SCCA (squamous cell carcinoma) of skin 09/17/2000   Right Lower Leg([place]) Dr. Susa Simmonds   SCCA (squamous cell carcinoma) of skin 09/17/2000   Left Upper Lower Leg,Lower Knee([place]) Dr. Susa Simmonds   SCCA (squamous cell carcinoma) of skin 09/17/2000   Left Lower Medial Leg([place]) Dr. Susa Simmonds   SCCA (squamous cell carcinoma) of skin 10/13/2000   Left Medial Ankle([place]) Dr. Susa Simmonds (excision)   SCCA (squamous cell carcinoma) of skin 10/13/2000   Right Lateral Ankle([place]) Dr. Susa Simmonds (excision)   SCCA (squamous cell carcinoma) of skin 12/03/2000   Left Upper Arm([place]) Dr. Susa Simmonds   SCCA (squamous cell carcinoma) of skin 12/03/2000   Left Med. Lower Leg([place]) Dr. Susa Simmonds (excision)   SCCA (squamous cell carcinoma) of skin 12/11/2000   Left Upper Thigh([place]) Dr. Susa Simmonds   SCCA (squamous cell carcinoma) of skin 12/15/2000  Left Thigh([place]) Dr. Susa Simmonds   SCCA (squamous cell carcinoma) of skin 01/18/2001   Left Forearm(in [place]) Dr. Tonia Brooms   SCCA (squamous cell carcinoma) of skin 01/18/2001   Left Hand 2nd/3rd  map(in situ) Dr. 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 Tonia Brooms   SCCA (squamous cell carcinoma) of skin 10/20/2001   Right Ear Lobe([place]) Dr. Tonia Brooms (exc)   SCCA (squamous cell carcinoma) of skin 02/02/2002   Left Lower Leg Post Sup(in situ) Dr. 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 Tonia Brooms   SCCA (squamous cell carcinoma) of skin 04/03/2009   Left Post Shoulder(in situ) treatment curet and 5FU   SCCA (squamous cell carcinoma) of skin 04/03/2009   Left Outer Thigh(in situ) treatment curet and 5FU   SCCA (squamous cell carcinoma) of skin 04/03/2009   Right Lower Leg(in situ) treatment curet and 5FU   SCCA (squamous cell carcinoma) of skin 07/08/2016   Left Inner Knee(in situ) treatment after biopsy   SCCA (squamous cell carcinoma) of skin 10/18/2019   Right Upper Arm - CX3 + 5FU   Superficial basal cell carcinoma (BCC) 02/19/2000   Left Forearm (Dr. Tonia Brooms)   Past Surgical History:  Procedure Laterality Date   APPENDECTOMY     ATRIAL FIBRILLATION ABLATION N/A 02/08/2019   Procedure: ATRIAL FIBRILLATION ABLATION;  Surgeon: Thompson Grayer, MD;  Location: [facility];  Service: Cardiovascular;  Laterality: N/A;   BREAST BIOPSY     CARDIOVERSION N/A 12/29/2018   Procedure: CARDIOVERSION;  Surgeon: Dorothy Spark, MD;  Location: [facility];  Service: Cardiovascular;  Laterality: N/A;   CATARACT EXTRACTION  05/05/13   right eye    COLONOSCOPY  2006   ECTOPIC PREGNANCY SURGERY  1970   WISDOM TOOTH EXTRACTION      Current Outpatient Medications  Medication Sig Dispense Refill   alendronate (FOSAMAX) 35 MG tablet Take 35 mg by mouth every Tuesday. Take with a full glass of water on an empty stomach.      apixaban (ELIQUIS) 5 MG TABS tablet Take 1 tablet by mouth twice  a day 180 tablet 2   atorvastatin (LIPITOR) 10 MG tablet TAKE 1 TABLET (10 MG TOTAL) BY MOUTH DAILY. 90 tablet 3   Cholecalciferol (D3 VITAMIN PO) Take 10 mcg by mouth 2 (two) times daily.     diltiazem (CARDIZEM CD) 240 MG 24 hr capsule Take 1 capsule (240 mg total) by mouth daily. 90 capsule 3   diltiazem (CARDIZEM) 30 MG tablet Take 1 tablet (30 mg total) by mouth as needed (afib). 90 tablet 2   flecainide (TAMBOCOR) 50 MG tablet TAKE 1 TABLET (50 MG TOTAL) BY MOUTH 2 (TWO) TIMES DAILY. 180 tablet 2   lisinopril-hydrochlorothiazide (ZESTORETIC) 10-12.5 MG tablet TAKE 1 TABLET EVERY DAY 90 tablet 0   Multiple Vitamins-Minerals (MULTIVITAMIN WITH MINERALS) tablet Take 1 tablet by mouth daily with supper.      Omega-3 Fatty Acids (FISH OIL) 1200 MG CAPS Take 1,200 mg by mouth daily with supper.      Polyethyl Glycol-Propyl Glycol (LUBRICANT EYE DROPS) 0.4-0.3 % SOLN Place 1 drop into both eyes 3 (three) times daily as needed (for dry/irritated eyes.).     sodium chloride (OCEAN) 0.65 % SOLN nasal spray Place 1 spray into both nostrils 4 (four) times daily  as needed for congestion (allergies.).      No current facility-administered medications for this encounter.    Allergies  Allergen Reactions   Clindamycin Hcl Nausea And Vomiting    nervous   Codeine Nausea And Vomiting   Erythromycin Nausea And Vomiting   Tetracycline Nausea And Vomiting   Penicillins Rash    Did it involve swelling of the face/tongue/throat, SOB, or low BP? No Did it involve sudden or severe rash/hives, skin peeling, or any reaction on the inside of your mouth or nose? Yes Did you need to seek medical attention at a hospital or doctor's office? Yes When did it last happen?childhood If all above answers are NO, may proceed with cephalosporin use.    Sulfonamide Derivatives Nausea And Vomiting and Rash    Social History   Socioeconomic History   Marital status: Married    Spouse name:  Not on file   Number of children: Not on file   Years of education: Not on file   Highest education level: Not on file  Occupational History   Not on file  Tobacco Use   Smoking status: Never Smoker   Smokeless tobacco: Never Used  Vaping Use   Vaping Use: Never used  Substance and Sexual Activity   Alcohol use: No   Drug use: No   Sexual activity: Yes  Other Topics Concern   Not on file  Social History Narrative   HSG, [employer]-elementary ed   Married '64   3 sons-'66, '68, '71; 6 grandchildren   Work: taught school for 2 years , 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 Laymen's guide and forms provided.    Reviewed Aug '15      Exercsie:5 days a week, treadmill   Diet: healthy, weight watchers.         Attends [employer]   Social Determinants of Health   Financial Resource Strain: Low Risk    Difficulty of Paying Living Expenses: Not hard at all  Food Insecurity: No Food Insecurity   Worried About Charity fundraiser in the Last Year: Never true   Arboriculturist in the Last Year: Never true  Transportation Needs: No Transportation Needs   Lack of Transportation (Medical): No   Lack of Transportation (Non-Medical): No  Physical Activity: Inactive   Days of Exercise per Week: 0 days   Minutes of Exercise per Session: 0 min  Stress: No Stress Concern Present   Feeling of Stress : Not at all  Social Connections:    Frequency of Communication with Friends and Family: Not on file   Frequency of Social Gatherings with Friends and Family: Not on file   Attends Religious Services: Not on Electrical engineer or Organizations: Not on file   Attends Archivist Meetings: Not on file   Marital Status: Not on file  Intimate Partner Violence: Not At Risk   Fear of Current or Ex-Partner: No   Emotionally Abused: No    Physically Abused: No   Sexually Abused: No     ROS- All systems are reviewed and negative except as per the HPI above.  Physical Exam: Vitals:   06/20/20 0847  BP: 132/64  Pulse: 70  Weight: 56.8 kg  Height: 5\' 1"  (1.549 m)    GEN- The patient is well appearing elderly female, alert and oriented  x 3 today.   HEENT-head normocephalic, atraumatic, sclera clear, conjunctiva pink, hearing intact, trachea midline. Lungs- Clear to ausculation bilaterally, normal work of breathing Heart- Regular rate and rhythm, no murmurs, rubs or gallops  GI- soft, NT, ND, + BS Extremities- no clubbing, cyanosis, or edema MS- no significant deformity or atrophy Skin- no rash or lesion Psych- euthymic mood, full affect Neuro- strength and sensation are intact   Wt Readings from Last 3 Encounters:  06/20/20 56.8 kg  08/29/19 53.2 kg  07/08/19 54.9 kg    EKG today demonstrates SR HR 70, NST, PR 202, QRS 90, QTc 423  Echo 06/21/2014 demonstrated  - Left ventricle: The cavity size was normal. Wall thickness was normal. Systolic function was normal. The estimated ejection fraction was in the range of 55% to 60%. Wall motion was normal; there were no regional wall motion abnormalities. 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 E/E&' ratio is >15, suggesting elevated LV filling pressure. - Mitral valve: Mildly thickened leaflets . Mild late systolic bileaflet prolapse. There was moderate regurgitation. - Left atrium: The atrium was moderately dilated (39 ml/m2). - Right atrium: The atrium was mildly dilated. - Atrial septum: No defect or patent foramen ovale was identified.  Epic records are reviewed at length today   CHA2DS2-VASc Score = 4  The patient's score is based upon: CHF History: 0 HTN History: 1 Age : 2 Diabetes History: 0 Stroke History: 0 Vascular Disease History: 0 Gender: 1      ASSESSMENT AND PLAN: 1. Paroxysmal Atrial Fibrillation (ICD10:   I48.0) The patient's CHA2DS2-VASc score is 4, indicating a 4.8% annual risk of stroke.   S/p ablation 02/08/19 Her afib is well controlled at this time. She may hold Eliquis 1-2 days prior to colonoscopy per pharmacy recommendations.  Continue flecainide 50 mg BID Continue Eliquis 5 mg BID Continue diltiazem 240 mg daily with 30 mg PRN q 4 hours for heart racing.  2. Secondary Hypercoagulable State (ICD10:  D68.69) The patient is at significant risk for stroke/thromboembolism based upon her CHA2DS2-VASc Score of 4.  Continue Apixaban (Eliquis).   3. HTN Stable, no changes today.   Follow up with Dr Rayann Heman in 6 months. AF clinic in one year.   [facility] [address] [place], [place] [address] [phone] 06/20/2020 9:06 AM

## 2020-06-22 NOTE — Telephone Encounter (Signed)
Patient was doing well on cardiac stand point during office visit. Per note "Her afib is well controlled at this time. She may hold Eliquis 1-2 days prior to colonoscopy per pharmacy recommendations".  Given past medical history and time since last visit, based on ACC/AHA guidelines, Alexandria Mcdowell would be at acceptable risk for the planned procedure without further cardiovascular testing.    I will route this recommendation to the requesting party via Epic fax function and remove from pre-op pool.  Please call with questions.  [place], [place] 06/22/2020, 9:19 AM

## 2020-07-02 ENCOUNTER — Ambulatory Visit: Payer: Medicare HMO

## 2020-07-04 ENCOUNTER — Ambulatory Visit: Payer: Medicare HMO

## 2020-07-13 ENCOUNTER — Other Ambulatory Visit: Payer: Self-pay

## 2020-07-13 ENCOUNTER — Encounter: Payer: Self-pay | Admitting: Family Medicine

## 2020-07-13 ENCOUNTER — Ambulatory Visit (INDEPENDENT_AMBULATORY_CARE_PROVIDER_SITE_OTHER): Payer: Medicare HMO | Admitting: Family Medicine

## 2020-07-13 VITALS — BP 120/60 | HR 67 | Temp 97.9°F | Ht 60.5 in | Wt 121.5 lb

## 2020-07-13 DIAGNOSIS — R7303 Prediabetes: Secondary | ICD-10-CM | POA: Diagnosis not present

## 2020-07-13 DIAGNOSIS — Z862 Personal history of diseases of the blood and blood-forming organs and certain disorders involving the immune mechanism: Secondary | ICD-10-CM

## 2020-07-13 DIAGNOSIS — D6869 Other thrombophilia: Secondary | ICD-10-CM | POA: Diagnosis not present

## 2020-07-13 DIAGNOSIS — E7800 Pure hypercholesterolemia, unspecified: Secondary | ICD-10-CM | POA: Diagnosis not present

## 2020-07-13 DIAGNOSIS — Z0000 Encounter for general adult medical examination without abnormal findings: Secondary | ICD-10-CM | POA: Diagnosis not present

## 2020-07-13 DIAGNOSIS — I480 Paroxysmal atrial fibrillation: Secondary | ICD-10-CM

## 2020-07-13 DIAGNOSIS — I10 Essential (primary) hypertension: Secondary | ICD-10-CM | POA: Diagnosis not present

## 2020-07-13 LAB — COMPREHENSIVE METABOLIC PANEL
ALT: 31 U/L (ref 0–35)
AST: 32 U/L (ref 0–37)
Albumin: 4.3 g/dL (ref 3.5–5.2)
Alkaline Phosphatase: 116 U/L (ref 39–117)
BUN: 15 mg/dL (ref 6–23)
CO2: 30 mEq/L (ref 19–32)
Calcium: 10.2 mg/dL (ref 8.4–10.5)
Chloride: 99 mEq/L (ref 96–112)
Creatinine, Ser: 0.85 mg/dL (ref 0.40–1.20)
GFR: 64.95 mL/min (ref >60.00)
Glucose, Bld: 84 mg/dL (ref 70–99)
Potassium: 3.7 mEq/L (ref 3.5–5.1)
Sodium: 137 mEq/L (ref 135–145)
Total Bilirubin: 0.6 mg/dL (ref 0.2–1.2)
Total Protein: 7.5 g/dL (ref 6.0–8.3)

## 2020-07-13 LAB — CBC WITH DIFFERENTIAL/PLATELET
Basophils Absolute: 0 10*3/uL (ref 0.0–0.1)
Basophils Relative: 0.5 % (ref 0.0–3.0)
Eosinophils Absolute: 0.2 10*3/uL (ref 0.0–0.7)
Eosinophils Relative: 2.1 % (ref 0.0–5.0)
HCT: 39.1 % (ref 36.0–46.0)
Hemoglobin: 13.6 g/dL (ref 12.0–15.0)
Lymphocytes Relative: 19.5 % (ref 12.0–46.0)
Lymphs Abs: 1.4 10*3/uL (ref 0.7–4.0)
MCHC: 34.7 g/dL (ref 30.0–36.0)
MCV: 90.7 fl (ref 78.0–100.0)
Monocytes Absolute: 0.7 10*3/uL (ref 0.1–1.0)
Monocytes Relative: 10.4 % (ref 3.0–12.0)
Neutro Abs: 4.8 10*3/uL (ref 1.4–7.7)
Neutrophils Relative %: 67.5 % (ref 43.0–77.0)
Platelets: 268 10*3/uL (ref 150.0–400.0)
RBC: 4.31 Mil/uL (ref 3.87–5.11)
RDW: 13.2 % (ref 11.5–15.5)
WBC: 7.1 10*3/uL (ref 4.0–10.5)

## 2020-07-13 LAB — LIPID PANEL
Cholesterol: 164 mg/dL (ref 0–200)
HDL: 57.7 mg/dL (ref >39.00)
LDL Cholesterol: 83 mg/dL (ref 0–99)
NonHDL: 106.56
Total CHOL/HDL Ratio: 3
Triglycerides: 120 mg/dL (ref 0.0–149.0)
VLDL: 24 mg/dL (ref 0.0–40.0)

## 2020-07-13 LAB — HEMOGLOBIN A1C: Hgb A1c MFr Bld: 5.6 % (ref 4.6–6.5)

## 2020-07-13 NOTE — Assessment & Plan Note (Signed)
On eliquis for afib.

## 2020-07-13 NOTE — Assessment & Plan Note (Signed)
Due for re-eval. 

## 2020-07-13 NOTE — Progress Notes (Signed)
Chief Complaint  Patient presents with  . Medicare Wellness    History of Present Illness: HPI   The patient presents for annual medicare wellness, complete physical and review of chronic health problems. He/She also has the following acute concerns today: none  I have personally reviewed the Medicare Annual Wellness questionnaire and have noted 1. The patient's medical and social history 2. Their use of alcohol, tobacco or illicit drugs 3. Their current medications and supplements 4. The patient's functional ability including ADL's, fall risks, home safety risks and hearing or visual             impairment. 5. Diet and physical activities 6. Evidence for depression or mood disorders 7.         Updated provider list Cognitive evaluation was performed and recorded on pt medicare questionnaire form. The patients weight, height, BMI and visual acuity have been recorded in the chart  I have made referrals, counseling and provided education to the patient based review of the above and I have provided the pt with a written personalized care plan for preventive services.   Documentation of this information was scanned into the electronic record under the media tab.   Advance directives and end of life planning reviewed in detail with patient and documented in EMR. Patient given handout on advance care directives if needed. HCPOA and living will updated if needed.   Hearing Screening   125Hz  250Hz  500Hz  1000Hz  2000Hz  3000Hz  4000Hz  6000Hz  8000Hz   Right ear:           Left ear:           Comments: Wears Bilateral Hearing Aides  Vision Screening Comments: Wears Mabeline Caras Exam with [place] 06/2020   Fall Risk  07/13/2020 07/01/2019 06/23/2018 06/17/2017 05/20/2016  Falls in the past year? 0 1 No No No  Number falls in past yr: - 0 - - -  Injury with Fall? - 0 - - -  Risk for fall due to : - Medication side effect - - -  Follow up - Falls evaluation completed;Falls prevention  discussed - - -      Office Visit from 07/13/2020 in [place] at [facility] Total Score 0     Hypertension:   BP Readings from Last 3 Encounters:  07/13/20 120/60  06/20/20 132/64  08/29/19 116/64  Using medication without problems or lightheadedness: none Chest pain with exertion:none Edema:none Short of breath:none Average home BPs: Other issues:   She is trying to go to [facility] off and on.  Diet: heart healthy   Followed by cardiology ( Dr. Rayann Heman) for  paroxsymal atrial fibrillation reviewed last OV 06/20/2020 Clint Fenton PA S/p ablation 02/08/19 Continue flecainide 50 mg BID Continue Eliquis 5 mg BID Continue diltiazem 240 mg daily with 30 mg PRN q 4 hours for heart racing.  Prediabetes and cholesterol.. due for labs.  Patient Care Team: Jinny Sanders, MD as PCP - General (Family Medicine) Josue Hector, MD as PCP - Cardiology (Cardiology) Lavonna Monarch, MD (Dermatology) Madilyn Hook, [place] as Consulting Physician (Optometry) Teena Irani, MD (Inactive) as Consulting Physician (Gastroenterology)  This visit occurred during the SARS-CoV-2 public health emergency.  Safety protocols were in place, including screening questions prior to the visit, additional usage of staff PPE, and extensive cleaning of exam room while observing appropriate contact time as indicated for disinfecting solutions.   COVID 19 screen:  No recent travel or known exposure to [place] The patient  denies respiratory symptoms of COVID 19 at this time. The importance of social distancing was discussed today.     Review of Systems  Constitutional: Negative for chills and fever.  HENT: Negative for congestion and ear pain.   Eyes: Negative for pain and redness.  Respiratory: Negative for cough and shortness of breath.   Cardiovascular: Negative for chest pain, palpitations and leg swelling.  Gastrointestinal: Negative for abdominal pain, blood in stool, constipation, diarrhea,  nausea and vomiting.  Genitourinary: Negative for dysuria.  Musculoskeletal: Negative for falls and myalgias.  Skin: Negative for rash.  Neurological: Negative for dizziness.  Psychiatric/Behavioral: Negative for depression. The patient is not nervous/anxious.       Past Medical History:  Diagnosis Date  . Allergy   . Anemia   . Atypical mole 10/20/2001   Left Mid to Upper Back-Slight to Moderate(no treatment)  . BCC (basal cell carcinoma of skin) 02/07/2004   Left Shin(excision) Dr. Tonia Brooms  . Hyperlipidemia   . Hypertension   . Migraine   . 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 Nodular basal cell carcinoma (BCC) 10/18/2019   Right Cheek-treatment curet, cauter and excision  . Paroxysmal atrial fibrillation (HCC)   . 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 Tonia Brooms  . SCCA (squamous cell carcinoma) of skin 08/03/2000   Left Shin([place]) Dr. Tonia Brooms  . SCCA (squamous cell carcinoma) of skin 08/03/2000   Right Shin([place]) Dr. Tonia Brooms  . SCCA (squamous cell carcinoma) of skin 09/17/2000   Right Forearm([place]) Dr. Susa Simmonds  . SCCA (squamous cell carcinoma) of skin 09/17/2000   Right Lower Leg([place]) Dr. Susa Simmonds  . SCCA (squamous cell carcinoma) of skin 09/17/2000   Left Upper Lower Leg,Lower Knee([place]) Dr. Susa Simmonds  . SCCA (squamous cell carcinoma) of skin 09/17/2000   Left Lower Medial Leg([place]) Dr. Susa Simmonds  . SCCA (squamous cell carcinoma) of skin 10/13/2000   Left Medial Ankle([place]) Dr. Susa Simmonds (excision)  . SCCA (squamous cell carcinoma) of skin 10/13/2000   Right Lateral Ankle([place]) Dr. Susa Simmonds (excision)  . SCCA (squamous cell carcinoma) of skin 12/03/2000   Left Upper Arm([place]) Dr. Susa Simmonds  . SCCA (squamous cell carcinoma) of skin 12/03/2000   Left Med. Lower Leg([place]) Dr.  Susa Simmonds (excision)  . SCCA (squamous cell carcinoma) of skin 12/11/2000   Left Upper Thigh([place]) Dr. Susa Simmonds  . SCCA (squamous cell carcinoma) of skin 12/15/2000   Left Thigh([place]) Dr. Susa Simmonds  . SCCA (squamous cell carcinoma) of skin 01/18/2001   Left Forearm(in Situ) Dr. Tonia Brooms  . SCCA (squamous cell carcinoma) of skin 01/18/2001   Left Hand 2nd/3rd map(in situ) Dr. Tonia Brooms)  . SCCA (squamous cell carcinoma) of skin 01/18/2001   Left Shin Lat(in situ) Dr. Tonia Brooms  . SCCA (squamous cell carcinoma) of skin 01/18/2001   Left Calf(in situ) Dr. Tonia Brooms  . SCCA (squamous cell carcinoma) of skin 10/20/2001   Right Ear Lobe([place]) Dr. Tonia Brooms (exc)  . SCCA (squamous cell carcinoma) of skin 02/02/2002   Left Lower Leg Post Sup(in situ) Dr. Tonia Brooms  . SCCA (squamous cell carcinoma) of skin 02/07/2004   Left Shin Lateral(in situ) Dr. Tonia Brooms  . SCCA (squamous cell carcinoma) of skin 04/03/2009   Left Post Shoulder(in situ) treatment curet and 5FU  . SCCA (squamous cell carcinoma) of skin 04/03/2009   Left Outer Thigh(in situ) treatment curet and 5FU  . SCCA (squamous cell carcinoma) of skin 04/03/2009   Right Lower Leg(in situ) treatment curet  and 5FU  . SCCA (squamous cell carcinoma) of skin 07/08/2016   Left Inner Knee(in situ) treatment after biopsy  . SCCA (squamous cell carcinoma) of skin 10/18/2019   Right Upper Arm - CX3 + 5FU  . Superficial basal cell carcinoma (BCC) 02/19/2000   Left Forearm (Dr. Tonia Brooms)    reports that she has never smoked. She has never used smokeless tobacco. She reports that she does not drink alcohol and does not use drugs.   Current Outpatient Medications:  .  alendronate (FOSAMAX) 35 MG tablet, Take 35 mg by mouth every Tuesday. Take with a full glass of water on an empty stomach. , Disp: , Rfl:  .  apixaban (ELIQUIS) 5 MG TABS tablet, Take 1 tablet by mouth twice a day, Disp: 180 tablet, Rfl: 2 .  atorvastatin  (LIPITOR) 10 MG tablet, TAKE 1 TABLET (10 MG TOTAL) BY MOUTH DAILY., Disp: 90 tablet, Rfl: 3 .  Cholecalciferol (D3 VITAMIN PO), Take 10 mcg by mouth 2 (two) times daily., Disp: , Rfl:  .  diltiazem (CARDIZEM CD) 240 MG 24 hr capsule, Take 1 capsule (240 mg total) by mouth daily., Disp: 90 capsule, Rfl: 3 .  diltiazem (CARDIZEM) 30 MG tablet, Take 1 tablet (30 mg total) by mouth as needed (afib)., Disp: 90 tablet, Rfl: 2 .  flecainide (TAMBOCOR) 50 MG tablet, TAKE 1 TABLET (50 MG TOTAL) BY MOUTH 2 (TWO) TIMES DAILY., Disp: 180 tablet, Rfl: 2 .  lisinopril-hydrochlorothiazide (ZESTORETIC) 10-12.5 MG tablet, TAKE 1 TABLET EVERY DAY, Disp: 90 tablet, Rfl: 0 .  Multiple Vitamins-Minerals (MULTIVITAMIN WITH MINERALS) tablet, Take 1 tablet by mouth daily with supper. , Disp: , Rfl:  .  Omega-3 Fatty Acids (FISH OIL) 1200 MG CAPS, Take 1,200 mg by mouth daily with supper. , Disp: , Rfl:  .  Polyethyl Glycol-Propyl Glycol (LUBRICANT EYE DROPS) 0.4-0.3 % SOLN, Place 1 drop into both eyes 3 (three) times daily as needed (for dry/irritated eyes.)., Disp: , Rfl:  .  sodium chloride (OCEAN) 0.65 % SOLN nasal spray, Place 1 spray into both nostrils 4 (four) times daily as needed for congestion (allergies.). , Disp: , Rfl:    Observations/Objective: Blood pressure 120/60, pulse 67, temperature 97.9 F (36.6 C), temperature source Temporal, height 5' 0.5" (1.537 m), weight 121 lb 8 oz (55.1 kg), SpO2 96 %.  Physical Exam Constitutional:      General: She is not in acute distress.    Appearance: Normal appearance. She is well-developed. She is not ill-appearing or toxic-appearing.  HENT:     Head: Normocephalic.     Right Ear: Hearing, tympanic membrane, ear canal and external ear normal.     Left Ear: Hearing, tympanic membrane, ear canal and external ear normal.     Nose: Nose normal.  Eyes:     General: Lids are normal. Lids are everted, no foreign bodies appreciated.     Conjunctiva/sclera: Conjunctivae  normal.     Pupils: Pupils are equal, round, and reactive to light.  Neck:     Thyroid: No thyroid mass or thyromegaly.     Vascular: No carotid bruit.     Trachea: Trachea normal.  Cardiovascular:     Rate and Rhythm: Normal rate and regular rhythm.     Heart sounds: Normal heart sounds, S1 normal and S2 normal. No murmur heard.  No gallop.   Pulmonary:     Effort: Pulmonary effort is normal. No respiratory distress.     Breath sounds: Normal breath sounds.  No wheezing, rhonchi or rales.  Abdominal:     General: Bowel sounds are normal. There is no distension or abdominal bruit.     Palpations: Abdomen is soft. There is no fluid wave or mass.     Tenderness: There is no abdominal tenderness. There is no guarding or rebound.     Hernia: No hernia is present.  Musculoskeletal:     Cervical back: Normal range of motion and neck supple.  Lymphadenopathy:     Cervical: No cervical adenopathy.  Skin:    General: Skin is warm and dry.     Findings: No rash.  Neurological:     Mental Status: She is alert.     Cranial Nerves: No cranial nerve deficit.     Sensory: No sensory deficit.  Psychiatric:        Mood and Affect: Mood is not anxious or depressed.        Speech: Speech normal.        Behavior: Behavior normal. Behavior is cooperative.        Judgment: Judgment normal.      Assessment and Plan   The patient's preventative maintenance and recommended screening tests for an annual wellness exam were reviewed in full today. Brought up to date unless services declined.  Counselled on the importance of diet, exercise, and its role in overall health and mortality. The patient's FH and SH was reviewed, including their home life, tobacco status, and drug and alcohol status.   Vaccines:Uptodate , not interested in shingles vaccine. COVID vaccine x 3 Mammo: nml8/2020 per pt, Plans q2 years. Mother breast cancer. Scheduled in Nov. Colon: Dr Amedeo Plenty, 2016 nml, mother colon cancer...  next one in 07/2020 DEXA:worsenedosteopenia2019, q 2 year... Now on fosamx 2019, repeat 2021.. scheduled PAP/DVE : PAP not indicated, no family hx of uterine ovarian cancer. No concerns. No DVE indicated. Nonsmoker  Prediabetes Due for eval.  Secondary hypercoagulable state ([place]) On eliquis for afib.  Paroxysmal atrial fibrillation (HCC) Stable s/p ablation, on flecanide and diltiazem. On eliquis anticoagulation.  Essential hypertension Well controlled. Continue current medication. '  Hypercholesterolemia  Due for re-eval.   Eliezer Lofts, MD

## 2020-07-13 NOTE — Assessment & Plan Note (Signed)
Stable s/p ablation, on flecanide and diltiazem. On eliquis anticoagulation.

## 2020-07-13 NOTE — Assessment & Plan Note (Signed)
Due for eval.

## 2020-07-13 NOTE — Patient Instructions (Addendum)
Please stop at the lab to have labs drawn. Work on getting back to exercise as able. Keep up with healthy eating.

## 2020-07-13 NOTE — Assessment & Plan Note (Signed)
Well controlled. Continue current medication.  

## 2020-07-20 ENCOUNTER — Other Ambulatory Visit: Payer: Self-pay | Admitting: Internal Medicine

## 2020-07-23 DIAGNOSIS — M8588 Other specified disorders of bone density and structure, other site: Secondary | ICD-10-CM | POA: Diagnosis not present

## 2020-07-23 DIAGNOSIS — Z01419 Encounter for gynecological examination (general) (routine) without abnormal findings: Secondary | ICD-10-CM | POA: Diagnosis not present

## 2020-07-23 DIAGNOSIS — Z1231 Encounter for screening mammogram for malignant neoplasm of breast: Secondary | ICD-10-CM | POA: Diagnosis not present

## 2020-07-23 DIAGNOSIS — Z6822 Body mass index (BMI) 22.0-22.9, adult: Secondary | ICD-10-CM | POA: Diagnosis not present

## 2020-07-23 DIAGNOSIS — N958 Other specified menopausal and perimenopausal disorders: Secondary | ICD-10-CM | POA: Diagnosis not present

## 2020-08-03 DIAGNOSIS — Z1159 Encounter for screening for other viral diseases: Secondary | ICD-10-CM | POA: Diagnosis not present

## 2020-08-06 ENCOUNTER — Other Ambulatory Visit: Payer: Self-pay | Admitting: Family Medicine

## 2020-08-08 ENCOUNTER — Encounter: Payer: Self-pay | Admitting: Family Medicine

## 2020-08-08 DIAGNOSIS — K5730 Diverticulosis of large intestine without perforation or abscess without bleeding: Secondary | ICD-10-CM | POA: Diagnosis not present

## 2020-08-08 DIAGNOSIS — Z800 Family history of malignant neoplasm of digestive organs: Secondary | ICD-10-CM | POA: Diagnosis not present

## 2020-08-08 DIAGNOSIS — Z1211 Encounter for screening for malignant neoplasm of colon: Secondary | ICD-10-CM | POA: Diagnosis not present

## 2020-08-08 DIAGNOSIS — K6389 Other specified diseases of intestine: Secondary | ICD-10-CM | POA: Diagnosis not present

## 2020-08-08 DIAGNOSIS — K621 Rectal polyp: Secondary | ICD-10-CM | POA: Diagnosis not present

## 2020-08-10 ENCOUNTER — Other Ambulatory Visit (HOSPITAL_COMMUNITY): Payer: Self-pay | Admitting: Physician Assistant

## 2020-08-14 ENCOUNTER — Ambulatory Visit: Payer: Medicare HMO

## 2020-08-14 DIAGNOSIS — K621 Rectal polyp: Secondary | ICD-10-CM | POA: Diagnosis not present

## 2020-10-03 ENCOUNTER — Other Ambulatory Visit: Payer: Self-pay | Admitting: *Deleted

## 2020-10-03 MED ORDER — ATORVASTATIN CALCIUM 10 MG PO TABS: 10.0000 mg | ORAL_TABLET | Freq: Every day | ORAL | 3 refills | Status: DC

## 2020-11-13 ENCOUNTER — Other Ambulatory Visit (HOSPITAL_COMMUNITY): Payer: Self-pay | Admitting: Physician Assistant

## 2020-11-14 ENCOUNTER — Other Ambulatory Visit (HOSPITAL_COMMUNITY): Payer: Self-pay

## 2020-11-14 MED ORDER — APIXABAN 2.5 MG PO TABS: 2.5000 mg | ORAL_TABLET | Freq: Two times a day (BID) | ORAL | 2 refills | Status: DC

## 2020-12-24 ENCOUNTER — Ambulatory Visit: Payer: Medicare HMO | Admitting: Internal Medicine

## 2020-12-24 ENCOUNTER — Other Ambulatory Visit: Payer: Self-pay

## 2020-12-24 ENCOUNTER — Encounter: Payer: Self-pay | Admitting: Internal Medicine

## 2020-12-24 VITALS — BP 128/74 | HR 85 | Ht 60.5 in | Wt 124.8 lb

## 2020-12-24 DIAGNOSIS — I10 Essential (primary) hypertension: Secondary | ICD-10-CM

## 2020-12-24 DIAGNOSIS — I480 Paroxysmal atrial fibrillation: Secondary | ICD-10-CM

## 2020-12-24 NOTE — Progress Notes (Signed)
PCP: Jinny Sanders, MD   Primary EP: Dr Rayann Heman  Alexandria Mcdowell is a 80 y.o. female who presents today for routine electrophysiology followup.  Since last being seen in our clinic, the patient reports doing very well.  Today, she denies symptoms of palpitations, chest pain, shortness of breath,  lower extremity edema, dizziness, presyncope, or syncope.  The patient is otherwise without complaint today.   Past Medical History:  Diagnosis Date  . Allergy   . Anemia   . Atypical mole 10/20/2001   Left Mid to Upper Back-Slight to Moderate(no treatment)  . BCC (basal cell carcinoma of skin) 02/07/2004   Left Shin(excision) Dr. Tonia Brooms  . Hyperlipidemia   . Hypertension   . Migraine   . 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 Nodular basal cell carcinoma (BCC) 10/18/2019   Right Cheek-treatment curet, cauter and excision  . Paroxysmal atrial fibrillation (HCC)   . 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 Tonia Brooms  . SCCA (squamous cell carcinoma) of skin 08/03/2000   Left Shin([place]) Dr. Tonia Brooms  . SCCA (squamous cell carcinoma) of skin 08/03/2000   Right Shin([place]) Dr. Tonia Brooms  . SCCA (squamous cell carcinoma) of skin 09/17/2000   Right Forearm([place]) Dr. Susa Simmonds  . SCCA (squamous cell carcinoma) of skin 09/17/2000   Right Lower Leg([place]) Dr. Susa Simmonds  . SCCA (squamous cell carcinoma) of skin 09/17/2000   Left Upper Lower Leg,Lower Knee([place]) Dr. Susa Simmonds  . SCCA (squamous cell carcinoma) of skin 09/17/2000   Left Lower Medial Leg([place]) Dr. Susa Simmonds  . SCCA (squamous cell carcinoma) of skin 10/13/2000   Left Medial Ankle([place]) Dr. Susa Simmonds (excision)  . SCCA (squamous cell carcinoma) of skin 10/13/2000   Right Lateral Ankle([place]) Dr. Susa Simmonds (excision)  . SCCA (squamous cell carcinoma) of skin 12/03/2000    Left Upper Arm([place]) Dr. Susa Simmonds  . SCCA (squamous cell carcinoma) of skin 12/03/2000   Left Med. Lower Leg([place]) Dr. Susa Simmonds (excision)  . SCCA (squamous cell carcinoma) of skin 12/11/2000   Left Upper Thigh([place]) Dr. Susa Simmonds  . SCCA (squamous cell carcinoma) of skin 12/15/2000   Left Thigh([place]) Dr. Susa Simmonds  . SCCA (squamous cell carcinoma) of skin 01/18/2001   Left Forearm(in Situ) Dr. Tonia Brooms  . SCCA (squamous cell carcinoma) of skin 01/18/2001   Left Hand 2nd/3rd map(in situ) Dr. Tonia Brooms)  . SCCA (squamous cell carcinoma) of skin 01/18/2001   Left Shin Lat(in situ) Dr. Tonia Brooms  . SCCA (squamous cell carcinoma) of skin 01/18/2001   Left Calf(in situ) Dr. Tonia Brooms  . SCCA (squamous cell carcinoma) of skin 10/20/2001   Right Ear Lobe([place]) Dr. Tonia Brooms (exc)  . SCCA (squamous cell carcinoma) of skin 02/02/2002   Left Lower Leg Post Sup(in situ) Dr. Tonia Brooms  . SCCA (squamous cell carcinoma) of skin 02/07/2004   Left Shin Lateral(in situ) Dr. Tonia Brooms  . SCCA (squamous cell carcinoma) of skin 04/03/2009   Left Post Shoulder(in situ) treatment curet and 5FU  . SCCA (squamous cell carcinoma) of skin 04/03/2009   Left Outer Thigh(in situ) treatment curet and 5FU  . SCCA (squamous cell carcinoma) of skin 04/03/2009   Right Lower Leg(in situ) treatment curet and 5FU  . SCCA (squamous cell carcinoma) of skin 07/08/2016   Left Inner Knee(in situ) treatment after biopsy  . SCCA (squamous cell carcinoma) of skin 10/18/2019   Right Upper Arm - CX3 + 5FU  . Superficial basal cell carcinoma (BCC) 02/19/2000  Left Forearm (Dr. Tonia Brooms)   Past Surgical History:  Procedure Laterality Date  . APPENDECTOMY    . ATRIAL FIBRILLATION ABLATION N/A 02/08/2019   Procedure: ATRIAL FIBRILLATION ABLATION;  Surgeon: Thompson Grayer, MD;  Location: [facility];  Service: Cardiovascular;  Laterality: N/A;  . BREAST BIOPSY    . CARDIOVERSION  N/A 12/29/2018   Procedure: CARDIOVERSION;  Surgeon: Dorothy Spark, MD;  Location: [facility] ENDOSCOPY;  Service: Cardiovascular;  Laterality: N/A;  . CATARACT EXTRACTION  05/05/13   right eye   . COLONOSCOPY  2006  . [place]  . WISDOM TOOTH EXTRACTION      ROS- all systems are reviewed and negatives except as per HPI above  Current Outpatient Medications  Medication Sig Dispense Refill  . alendronate (FOSAMAX) 35 MG tablet Take 35 mg by mouth every Tuesday. Take with a full glass of water on an empty stomach.    Marland Kitchen apixaban (ELIQUIS) 2.5 MG TABS tablet Take 1 tablet (2.5 mg total) by mouth 2 (two) times daily. 180 tablet 2  . atorvastatin (LIPITOR) 10 MG tablet Take 1 tablet (10 mg total) by mouth daily. 90 tablet 3  . Cholecalciferol (D3 VITAMIN PO) Take 10 mcg by mouth 2 (two) times daily.    Marland Kitchen diltiazem (CARDIZEM CD) 240 MG 24 hr capsule TAKE 1 CAPSULE EVERY DAY 90 capsule 3  . diltiazem (CARDIZEM) 30 MG tablet Take 1 tablet (30 mg total) by mouth as needed (afib). 90 tablet 2  . flecainide (TAMBOCOR) 50 MG tablet TAKE 1 TABLET TWICE DAILY 180 tablet 1  . lisinopril-hydrochlorothiazide (ZESTORETIC) 10-12.5 MG tablet TAKE 1 TABLET EVERY DAY 90 tablet 1  . Multiple Vitamins-Minerals (MULTIVITAMIN WITH MINERALS) tablet Take 1 tablet by mouth daily with supper.     . Omega-3 Fatty Acids (FISH OIL) 1200 MG CAPS Take 1,200 mg by mouth daily with supper.     Vladimir Faster Glycol-Propyl Glycol 0.4-0.3 % SOLN Place 1 drop into both eyes 3 (three) times daily as needed (for dry/irritated eyes.).    Marland Kitchen sodium chloride (OCEAN) 0.65 % SOLN nasal spray Place 1 spray into both nostrils 4 (four) times daily as needed for congestion (allergies.).      No current facility-administered medications for this visit.    Physical Exam: Vitals:   12/24/20 0925  BP: 128/74  Pulse: 85  SpO2: 96%  Weight: 124 lb 12.8 oz (56.6 kg)  Height: 5' 0.5" (1.537 m)    GEN- The patient is well  appearing, alert and oriented x 3 today.   Head- normocephalic, atraumatic Eyes-  Sclera clear, conjunctiva pink Ears- hearing intact Oropharynx- clear Lungs- Clear to ausculation bilaterally, normal work of breathing Heart- Regular rate and rhythm, no murmurs, rubs or gallops, PMI not laterally displaced GI- soft, NT, ND, + BS Extremities- no clubbing, cyanosis, or edema  Wt Readings from Last 3 Encounters:  12/24/20 124 lb 12.8 oz (56.6 kg)  07/13/20 121 lb 8 oz (55.1 kg)  06/20/20 125 lb 3.2 oz (56.8 kg)    EKG tracing ordered today is personally reviewed and shows sinus rhythm 85 bpm, PR 202 msec, QTc 442 msec  Assessment and Plan:  1. Paroxysmal atrial fibrillation She has done very well post ablation 2020.  No afib post ablation stop flecainide chads2vasc score is 4.  Continue eliquis  2. HTN Stable No change required today  3. Moderate MR Noted on prior echo Repeat echo   Risks, benefits and potential toxicities  for medications prescribed and/or refilled reviewed with patient today.   Return to AF clinic in 6 months I will see in a year  Thompson Grayer MD, [facility] 12/24/2020 9:26 AM

## 2020-12-24 NOTE — Patient Instructions (Addendum)
Medication Instructions:  Stop Flecainide Your physician recommends that you continue on your current medications as directed. Please refer to the Current Medication list given to you today.  Labwork: None ordered.  Testing/Procedures:  Your physician has requested that you have an echocardiogram. Echocardiography is a painless test that uses sound waves to create images of your heart. It provides your doctor with information about the size and shape of your heart and how well your heart's chambers and valves are working. This procedure takes approximately one hour. There are no restrictions for this procedure.   Follow-Up: Your physician wants you to follow-up in: 6 months with the Afib Clinic, one year with Thompson Grayer, MD    Any Other Special Instructions Will Be Listed Below (If Applicable).  If you need a refill on your cardiac medications before your next appointment, please call your pharmacy.

## 2020-12-25 ENCOUNTER — Telehealth: Payer: Self-pay | Admitting: Internal Medicine

## 2020-12-25 NOTE — Telephone Encounter (Signed)
New Message:     Pt wants to know if Dr Rayann Heman thinks she needs the second Booster for Covid?

## 2020-12-25 NOTE — Telephone Encounter (Signed)
Spoke to patient about Covid booster. Advised to continue with booster at the time it is recommended.  Patient verbalized understanding

## 2021-01-02 ENCOUNTER — Other Ambulatory Visit: Payer: Self-pay | Admitting: Family Medicine

## 2021-02-08 ENCOUNTER — Encounter: Payer: Self-pay | Admitting: Family Medicine

## 2021-02-08 ENCOUNTER — Telehealth (INDEPENDENT_AMBULATORY_CARE_PROVIDER_SITE_OTHER): Payer: Medicare HMO | Admitting: Family Medicine

## 2021-02-08 DIAGNOSIS — I10 Essential (primary) hypertension: Secondary | ICD-10-CM | POA: Diagnosis not present

## 2021-02-08 DIAGNOSIS — R059 Cough, unspecified: Secondary | ICD-10-CM | POA: Diagnosis not present

## 2021-02-08 NOTE — Progress Notes (Signed)
   Alexandria Mcdowell - 80 y.o. female  MRN [id number]  Date of Birth: 12/28/1940  PCP: Jinny Sanders, MD  This service was provided via telemedicine. Phone Visit performed on 02/08/2021    Rationale for phone visit along with limitations reviewed. I discussed the limitations, risks, security and privacy concerns of performing a phone visit and the availability of in person appointments. I also discussed with the patient that there may be a patient responsible charge related to this service. Patient consented to telephone encounter.    Location of patient: at home Location of provider: in office, [place] @ [facility] Name of referring provider: N/A   Names of persons and role in encounter: Provider: Ria Bush, MD  Patient: Alexandria Mcdowell  Other: N/A   Time on call: 3:59pm - 4:20pm   Subjective: Chief Complaint  Patient presents with  . Cough    C/o cough and fatigue.  Sxs started this AM.  Denies any other sxs.      HPI:  Woke up this morning with marked fatigue. Started having cough later in the day. Cough somewhat productive of clear mucous. Mildly elevated temperature. Rhinorrhea. She can't find her COVID test.   No chills, ear or tooth pain, HA, chest or head congestion, abd pain, nausea or diarrhea, body aches, loss of taste or smell.   Treating with cough lozenges.  Husband also sick with cough. No known COVID exposures.  She lives 30 minutes from here.   Risk factors - hypertension. Also h/o ?prediabetes and afib on eliquis and diltiazem.  COVID vaccine - Pfizer x4.    Objective/Observations:  No physical exam or vital signs collected unless specifically identified below.   BP 136/83   Temp 99.4 F (37.4 C)   Ht 5' 0.5" (1.537 m)   Wt 120 lb (54.4 kg)   BMI 23.05 kg/m    Respiratory status: speaks in complete sentences without evident shortness of breath.   Assessment/Plan:  Cough 1d h/o cough with fatigue, rhinorrhea.  In setting of  pandemic reasonable to send for COVID swab - by her request I signed patient up for swab through [facility] website as she had difficulty doing this on her own - she will go swab tomorrow at 10:30am.  Possible viral URI with cough. Reviewed supportive care measures as well as red flags to seek urgent care over the weekend.  I did ask her to update [place] on Monday. Discussed of availability of on call provider over the weekend. Patient agrees with plan.   Essential hypertension BP stable on current regimen - continue.    I discussed the assessment and treatment plan with the patient. The patient was provided an opportunity to ask questions and all were answered. The patient agreed with the plan and demonstrated an understanding of the instructions.  Lab Orders  No laboratory test(s) ordered today    No orders of the defined types were placed in this encounter.   The patient was advised to call back or seek an in-person evaluation if the symptoms worsen or if the condition fails to improve as anticipated.  Ria Bush, MD

## 2021-02-08 NOTE — Assessment & Plan Note (Addendum)
1d h/o cough with fatigue, rhinorrhea.  In setting of pandemic reasonable to send for COVID swab - by her request I signed patient up for swab through [facility] website as she had difficulty doing this on her own - she will go swab tomorrow at 10:30am.  Possible viral URI with cough. Reviewed supportive care measures as well as red flags to seek urgent care over the weekend.  I did ask her to update [place] on Monday. Discussed of availability of on call provider over the weekend. Patient agrees with plan.

## 2021-02-08 NOTE — Assessment & Plan Note (Signed)
BP stable on current regimen - continue.  

## 2021-02-12 ENCOUNTER — Telehealth (INDEPENDENT_AMBULATORY_CARE_PROVIDER_SITE_OTHER): Payer: Medicare HMO | Admitting: Family Medicine

## 2021-02-12 DIAGNOSIS — U071 COVID-19: Secondary | ICD-10-CM | POA: Diagnosis not present

## 2021-02-12 MED ORDER — MOLNUPIRAVIR EUA 200MG CAPSULE: 4.0000 | ORAL_CAPSULE | Freq: Two times a day (BID) | ORAL | 0 refills | Status: AC

## 2021-02-12 MED ORDER — BENZONATATE 100 MG PO CAPS: 100.0000 mg | ORAL_CAPSULE | Freq: Three times a day (TID) | ORAL | 0 refills | Status: DC | PRN

## 2021-02-12 NOTE — Progress Notes (Signed)
Virtual Visit via Telephone Note  I connected with Alexandria Mcdowell on 02/12/21 at  4:20 PM EDT by telephone and verified that I am speaking with the correct person using two identifiers.   I discussed the limitations, risks, security and privacy concerns of performing an evaluation and management service by telephone and the availability of in person appointments. I also discussed with the patient that there may be a patient responsible charge related to this service. The patient expressed understanding and agreed to proceed.  Location patient: home, [place] Location provider: work or home office Participants present for the call: patient, provider, husband Patient did not have a visit with me in the prior 7 days to address this/these issue(s).   History of Present Illness:  Acute telemedicine visit for Covid19: -Onset: 4 days ago, had positive covid test -Symptoms include: nasal congestion, cough, feeling tired, sneezing -Denies: fever, CP, SOB, NVD, inability to eat/drin/get out bed -Pertinent past medical history: A. Fib, HTN -Pertinent medication allergies:  Allergies  Allergen Reactions  . Clindamycin Hcl Nausea And Vomiting    nervous  . Codeine Nausea And Vomiting  . Erythromycin Nausea And Vomiting  . Tetracycline Nausea And Vomiting  . Penicillins Rash    Did it involve swelling of the face/tongue/throat, SOB, or low BP? No Did it involve sudden or severe rash/hives, skin peeling, or any reaction on the inside of your mouth or nose? Yes Did you need to seek medical attention at a hospital or doctor's office? Yes When did it last happen?childhood If all above answers are "NO", may proceed with cephalosporin use.   . Sulfonamide Derivatives Nausea And Vomiting and Rash  -COVID-19 vaccine status: fully vaccinated and boosted  -no recent labs  Observations/Objective: Patient sounds cheerful and well on the phone. I do not appreciate any SOB. Speech and thought  processing are grossly intact. Patient reported vitals:  Assessment and Plan:  COVID-19   Discussed treatment options, ideal treatment window, potential complications, isolation and precautions for COVID-19.  After lengthy discussion, the patient opted for treatment with molnupiravir due to being higher risk for complications of covid or severe disease and other factors. Discussed EUA status of this drug and the fact that there is preliminary limited knowledge of risks/interactions/side effects per EUA document vs possible benefits and precautions. This information was shared with patient during the visit and also was provided in patient instructions. Also, advised that patient discuss risks/interactions and use with pharmacist/treatment team as well. The patient did want a prescription for cough, Tessalon Rx sent.  Other symptomatic care measures summarized in patient instructions. Advised to seek prompt in person care if worsening, new symptoms arise, or if is not improving with treatment. Advised of options for inperson care in case PCP office not available. Did let the patient know that I only do telemedicine shifts for [place] on Tuesdays and Thursdays and advised a follow up visit with PCP or at an [facility] if has further questions or concerns.   Follow Up Instructions:  I did not refer this patient for an OV with me in the next 24 hours for this/these issue(s).  I discussed the assessment and treatment plan with the patient. The patient was provided an opportunity to ask questions and all were answered. The patient agreed with the plan and demonstrated an understanding of the instructions.   I spent 18 minutes on the date of this visit in the care of this patient. See summary of tasks completed to properly care  for this patient in the detailed notes above which also included counseling of above, review of PMH, medications, allergies, evaluation of the patient and ordering and/or  instructing  patient on testing and care options.     Lucretia Kern, DO

## 2021-02-12 NOTE — Patient Instructions (Addendum)
HOME CARE TIPS:  -I sent the medication(s) we discussed to your pharmacy: Meds ordered this encounter  Medications   molnupiravir EUA 200 mg CAPS    Sig: Take 4 capsules (800 mg total) by mouth 2 (two) times daily for 5 days.    Dispense:  40 capsule    Refill:  0   benzonatate (TESSALON PERLES) 100 MG capsule    Sig: Take 1 capsule (100 mg total) by mouth 3 (three) times daily as needed.    Dispense:  20 capsule    Refill:  0     -I sent in the Covid19 treatment or referral you requested per our discussion. Please see the information provided below and discuss further with the pharmacist/treatment team.   -can use tylenol  if needed for fevers, aches and pains per instructions  -can use nasal saline a few times per day if you have nasal congestion; sometimes  a short course of Afrin nasal spray for 3 days can help with symptoms as well  -stay hydrated, drink plenty of fluids and eat small healthy meals - avoid dairy  -If the Covid test is positive, check out the [facility] website for more information on home care, transmission and treatment for COVID19  -follow up with your doctor in 2-3 days unless improving and feeling better  -stay home while sick, except to seek medical care. If you have COVID19, ideally it would be best to stay home for a full 10 days since the onset of symptoms PLUS one day of no fever and feeling better. Wear a good mask that fits snugly (such as N95 or KN95) if around others to reduce the risk of transmission.  It was nice to meet you today, and I really hope you are feeling better soon. I help [facility] out with telemedicine visits on Tuesdays and Thursdays and am available for visits on those days. If you have any concerns or questions following this visit please schedule a follow up visit with your Primary Care doctor or seek care at a local urgent care clinic to avoid delays in care.    Seek in person care or schedule a follow up video visit promptly if  your symptoms worsen, new concerns arise or you are not improving with treatment. Call 911 and/or seek emergency care if your symptoms are severe or life threatening.    Fact Sheet for Patients And Caregivers Emergency Use Authorization (EUA) Of LAGEVRIO (molnupiravir) capsules For Coronavirus Disease 2019 (COVID-19)  What is the most important information I should know about LAGEVRIO? LAGEVRIO may cause serious side effects, including:  LAGEVRIO may cause harm to your unborn baby. It is not known if LAGEVRIO will harm your baby if you take LAGEVRIO during pregnancy. o LAGEVRIO is not recommended for use in pregnancy. o LAGEVRIO has not been studied in pregnancy. LAGEVRIO was studied in pregnant animals only. When LAGEVRIO was given to pregnant animals, LAGEVRIO caused harm to their unborn babies. o You and your healthcare provider may decide that you should take LAGEVRIO during pregnancy if there are no other COVID-19 treatment options approved or authorized by the FDA that are accessible or clinically appropriate for you. o If you and your healthcare provider decide that you should take LAGEVRIO during pregnancy, you and your healthcare provider should discuss the known and potential benefits and the potential risks of taking LAGEVRIO during pregnancy. For individuals who are able to become pregnant:  You should use a reliable method of birth control (contraception)  consistently and correctly during treatment with LAGEVRIO and for 4 days after the last dose of LAGEVRIO. Talk to your healthcare provider about reliable birth control methods.  Before starting treatment with [facility] your healthcare provider may do a pregnancy test to see if you are pregnant before starting treatment with LAGEVRIO.  Tell your healthcare provider right away if you become pregnant or think you may be pregnant during treatment with LAGEVRIO. Pregnancy Surveillance Program:  There is a pregnancy  surveillance program for individuals who take LAGEVRIO during pregnancy. The purpose of this program is to collect information about the health of you and your baby. Talk to your healthcare provider about how to take part in this program.  If you take LAGEVRIO during pregnancy and you agree to participate in the pregnancy surveillance program and allow your healthcare provider to share your information with [employer], then your healthcare provider will report your use of LAGEVRIO during pregnancy to [employer] by calling [phone] or RelayImage.ca. For individuals who are sexually active with partners who are able to become pregnant:  It is not known if LAGEVRIO can affect sperm. While the risk is regarded as low, animal studies to fully assess the potential for LAGEVRIO to affect the babies of males treated with LAGEVRIO have not been completed. A reliable method of birth control (contraception) should be used consistently and correctly during treatment with LAGEVRIO and for at least 3 months after the last dose. The risk to sperm beyond 3 months is not known. Studies to understand the risk to sperm beyond 3 months are ongoing. Talk to your healthcare provider about reliable birth control methods. Talk to your healthcare provider if you have questions or concerns about how LAGEVRIO may affect sperm. You are being given this fact sheet because your healthcare provider believes it is necessary to provide you with LAGEVRIO for the treatment of adults with mild-to-moderate coronavirus disease 2019 (COVID-19) with positive results of direct SARS-CoV-2 viral testing, and who are at high risk for progression to severe COVID-19 including hospitalization or death, and for whom other COVID-19 treatment options approved or authorized by the FDA are not accessible or clinically appropriate. The U.S. Food and Drug Administration (FDA) has issued an Emergency  Use Authorization (EUA) to make LAGEVRIO available during the COVID-19 pandemic (for more details about an EUA please see What is an Emergency Use Authorization? at the end of this document). LAGEVRIO is not an FDA-approved medicine in the [place]. Read this Fact Sheet for information about LAGEVRIO. Talk to your healthcare provider about your options if you have any questions. It is your choice to take LAGEVRIO.  What is COVID-19? COVID-19 is caused by a virus called a coronavirus. You can get COVID-19 through close contact with another person who has the virus. COVID-19 illnesses have ranged from very mild-to-severe, including illness resulting in death. While information so far suggests that most COVID-19 illness is mild, serious illness can happen and may cause some of your other medical conditions to become worse. Older people and people of all ages with severe, long lasting (chronic) medical conditions like heart disease, lung disease and diabetes, for example seem to be at higher risk of being hospitalized for COVID-19.  What is LAGEVRIO? LAGEVRIO is an investigational medicine used to treat mild-to-moderate COVID-19 in adults:  with positive results of direct SARS-CoV-2 viral testing, and  who are at high risk for progression to severe COVID-19 including hospitalization or death, and  for whom other COVID-19 treatment options approved or authorized by the FDA are not accessible or clinically appropriate. The FDA has authorized the emergency use of LAGEVRIO for the treatment of mild-tomoderate COVID-19 in adults under an EUA. For more information on EUA, see the What is an Emergency Use Authorization (EUA)? section at the end of this Fact Sheet. LAGEVRIO is not authorized:  for use in people less than 60 years of age.  for prevention of COVID-19.  for people needing hospitalization for COVID-19.  for use for longer than 5 consecutive days.  What should I tell my  healthcare provider before I take LAGEVRIO? Tell your healthcare provider if you:  Have any allergies  Are breastfeeding or plan to breastfeed  Have any serious illnesses  Are taking any medicines (prescription, over-the-counter, vitamins, or herbal products).  How do I take LAGEVRIO?  Take LAGEVRIO exactly as your healthcare provider tells you to take it.  Take 4 capsules of LAGEVRIO every 12 hours (for example, at 8 am and at 8 pm)  Take LAGEVRIO for 5 days. It is important that you complete the full 5 days of treatment with LAGEVRIO. Do not stop taking LAGEVRIO before you complete the full 5 days of treatment, even if you feel better.  Take LAGEVRIO with or without food.  You should stay in isolation for as long as your healthcare provider tells you to. Talk to your healthcare provider if you are not sure about how to properly isolate while you have COVID-19.  Swallow LAGEVRIO capsules whole. Do not open, break, or crush the capsules. If you cannot swallow capsules whole, tell your healthcare provider.  What to do if you miss a dose: o If it has been less than 10 hours since the missed dose, take it as soon as you remember o If it has been more than 10 hours since the missed dose, skip the missed dose and take your dose at the next scheduled time.  Do not double the dose of LAGEVRIO to make up for a missed dose.  What are the important possible side effects of LAGEVRIO?  See, What is the most important information I should know about LAGEVRIO?  Allergic Reactions. Allergic reactions can happen in people taking LAGEVRIO, even after only 1 dose. Stop taking LAGEVRIO and call your healthcare provider right away if you get any of the following symptoms of an allergic reaction: o hives o rapid heartbeat o trouble swallowing or breathing o swelling of the mouth, lips, or face o throat tightness o hoarseness o skin rash The most common side effects of LAGEVRIO  are:  diarrhea  nausea  dizziness These are not all the possible side effects of LAGEVRIO. Not many people have taken LAGEVRIO. Serious and unexpected side effects may happen. This medicine is still being studied, so it is possible that all of the risks are not known at this time.  What other treatment choices are there?  Veklury (remdesivir) is FDA-approved as an intravenous (IV) infusion for the treatment of mildto-moderate COVID-19 in certain adults and children. Talk with your doctor to see if Elias Else is appropriate for you. Like LAGEVRIO, FDA may also allow for the emergency use of other medicines to treat people with COVID-19. Go to http://www.austin-beck.biz/ for more information. It is your choice to be treated or not to be treated with LAGEVRIO. Should you decide not to take it, it will not change your standard medical care.  What if I am breastfeeding? Breastfeeding is  not recommended during treatment with LAGEVRIO and for 4 days after the last dose of LAGEVRIO. If you are breastfeeding or plan to breastfeed, talk to your healthcare provider about your options and specific situation before taking LAGEVRIO.  How do I report side effects with LAGEVRIO? Contact your healthcare provider if you have any side effects that bother you or do not go away. Report side effects to FDA MedWatch at MacRetreat.be or call 1-800-FDA-1088 [phone]).  How should I store LAGEVRIO?  Store LAGEVRIO capsules at room temperature between 73F to 78F (20C to 25C).  Keep LAGEVRIO and all medicines out of the reach of children and pets. How can I learn more about COVID-19?  Ask your healthcare provider.  Visit IndexCrawler.co.za  Contact your local or state public health department.  Call [employer] at [phone] (toll free in the [place]  Visit  www.molnupiravir.com  What Is an Emergency Use Authorization (EUA)? The [place] FDA has made LAGEVRIO available under an emergency access mechanism called an Emergency Use Authorization (EUA) The EUA is supported by a Insurance account manager Health and Human Service (HHS) declaration that circumstances exist to justify emergency use of drugs and biological products during the COVID-19 pandemic. LAGEVRIO for the treatment of mild-to-moderate COVID-19 in adults with positive results of direct SARS-CoV-2 viral testing, who are at high risk for progression to severe COVID-19, including hospitalization or death, and for whom alternative COVID-19 treatment options approved or authorized by FDA are not accessible or clinically appropriate, has not undergone the same type of review as an FDA-approved product. In issuing an EUA under the COVID-19 public health emergency, the FDA has determined, among other things, that based on the total amount of scientific evidence available including data from adequate and well-controlled clinical trials, if available, it is reasonable to believe that the product may be effective for diagnosing, treating, or preventing COVID-19, or a serious or life-threatening disease or condition caused by COVID-19; that the known and potential benefits of the product, when used to diagnose, treat, or prevent such disease or condition, outweigh the known and potential risks of such product; and that there are no adequate, approved, and available alternatives.  All of these criteria must be met to allow for the product to be used in the treatment of patients during the COVID-19 pandemic. The EUA for LAGEVRIO is in effect for the duration of the COVID-19 declaration justifying emergency use of LAGEVRIO, unless terminated or revoked (after which LAGEVRIO may no longer be used under the EUA). For patent information: LeaseGuru.tn Copyright  2021-2022 [employer]  [employer], [place] and its affiliates. All rights reserved. [id number] Revised: March 2022

## 2021-05-13 ENCOUNTER — Telehealth: Payer: Self-pay | Admitting: Internal Medicine

## 2021-05-13 NOTE — Telephone Encounter (Signed)
Patient calling in because she stated on her last visit with dr Allred patient was told she was supposed to have echocardiogram done. But there is no ordered in there for that. Please advise

## 2021-05-13 NOTE — Addendum Note (Signed)
Addended by: Darrell Jewel on: 05/13/2021 02:42 PM   Modules accepted: Orders

## 2021-05-28 ENCOUNTER — Other Ambulatory Visit: Payer: Self-pay | Admitting: Family Medicine

## 2021-05-28 NOTE — Telephone Encounter (Signed)
Please schedule Medicare Wellness Visit with Dr. Diona Browner for after 07/13/2021.

## 2021-06-03 ENCOUNTER — Other Ambulatory Visit: Payer: Self-pay

## 2021-06-03 ENCOUNTER — Ambulatory Visit (HOSPITAL_COMMUNITY): Payer: Medicare HMO | Attending: Cardiology

## 2021-06-03 DIAGNOSIS — I480 Paroxysmal atrial fibrillation: Secondary | ICD-10-CM

## 2021-06-03 DIAGNOSIS — I10 Essential (primary) hypertension: Secondary | ICD-10-CM | POA: Diagnosis not present

## 2021-06-03 LAB — ECHOCARDIOGRAM COMPLETE
Area-P 1/2: 3.53 cm2
S' Lateral: 2.6 cm

## 2021-06-06 ENCOUNTER — Telehealth: Payer: Self-pay | Admitting: Family Medicine

## 2021-06-06 NOTE — Telephone Encounter (Signed)
Pt called in requesting to know what vaccin she needs this year would like a call back [phone]

## 2021-06-06 NOTE — Telephone Encounter (Signed)
Spoke with Mrs. Alexandria Mcdowell and advised she is due for her yearly flu shot and would also recommend Covid Bi-Valent booster.  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

## 2021-06-07 NOTE — Telephone Encounter (Signed)
Agree with high dose flu and COVID bivalent.

## 2021-06-17 ENCOUNTER — Other Ambulatory Visit (HOSPITAL_COMMUNITY): Payer: Self-pay | Admitting: Physician Assistant

## 2021-06-25 ENCOUNTER — Ambulatory Visit (HOSPITAL_COMMUNITY)
Admission: RE | Admit: 2021-06-25 | Discharge: 2021-06-25 | Disposition: A | Discharge status: Home / Self Care | Payer: Medicare HMO | Source: Ambulatory Visit | Attending: Physician Assistant | Admitting: Physician Assistant

## 2021-06-25 ENCOUNTER — Other Ambulatory Visit: Payer: Self-pay

## 2021-06-25 VITALS — BP 140/64 | HR 66 | Ht 60.5 in | Wt 122.0 lb

## 2021-06-25 DIAGNOSIS — I480 Paroxysmal atrial fibrillation: Secondary | ICD-10-CM | POA: Diagnosis not present

## 2021-06-25 DIAGNOSIS — Z8249 Family history of ischemic heart disease and other diseases of the circulatory system: Secondary | ICD-10-CM | POA: Insufficient documentation

## 2021-06-25 DIAGNOSIS — Z79899 Other long term (current) drug therapy: Secondary | ICD-10-CM | POA: Diagnosis not present

## 2021-06-25 DIAGNOSIS — D6869 Other thrombophilia: Secondary | ICD-10-CM

## 2021-06-25 DIAGNOSIS — I10 Essential (primary) hypertension: Secondary | ICD-10-CM | POA: Insufficient documentation

## 2021-06-25 DIAGNOSIS — Z7901 Long term (current) use of anticoagulants: Secondary | ICD-10-CM | POA: Insufficient documentation

## 2021-06-25 LAB — BASIC METABOLIC PANEL
Anion gap: 8 (ref 5–15)
BUN: 12 mg/dL (ref 8–23)
CO2: 29 mmol/L (ref 22–32)
Calcium: 9.9 mg/dL (ref 8.9–10.3)
Chloride: 99 mmol/L (ref 98–111)
Creatinine, Ser: 0.82 mg/dL (ref 0.44–1.00)
GFR, Estimated: >60 mL/min (ref >60)
Glucose, Bld: 90 mg/dL (ref 70–99)
Potassium: 3.7 mmol/L (ref 3.5–5.1)
Sodium: 136 mmol/L (ref 135–145)

## 2021-06-25 LAB — CBC
HCT: 39.8 % (ref 36.0–46.0)
Hemoglobin: 14 g/dL (ref 12.0–15.0)
MCH: 31.6 pg (ref 26.0–34.0)
MCHC: 35.2 g/dL (ref 30.0–36.0)
MCV: 89.8 fL (ref 80.0–100.0)
Platelets: 235 10*3/uL (ref 150–400)
RBC: 4.43 MIL/uL (ref 3.87–5.11)
RDW: 12.4 % (ref 11.5–15.5)
WBC: 5.9 10*3/uL (ref 4.0–10.5)
nRBC: 0 % (ref 0.0–0.2)

## 2021-06-25 MED ORDER — DILTIAZEM HCL 30 MG PO TABS: 30.0000 mg | ORAL_TABLET | ORAL | 2 refills | Status: DC | PRN

## 2021-06-25 NOTE — Progress Notes (Signed)
Primary Care Physician: Alexandria Sanders, MD Primary Cardiologist: Dr Alexandria Mcdowell (remotely) Primary Electrophysiologist: Dr Alexandria Mcdowell Referring Physician: TANISHA Mcdowell is a 80 y.o. female with a history of paroxysmal atrial fibrillation, HTN, HLD who presents for follow up in the [facility]. She is s/p ablation with Dr Alexandria Mcdowell on 02/08/19. Patient is now off flecainide and is doing well. She does rarely have palpitations which responds quickly to PRN diltiazem. She denies any bleeding issues on anticoagulation.   Today, she denies symptoms of chest pain, shortness of breath, orthopnea, PND, lower extremity edema, dizziness, presyncope, syncope, snoring, daytime somnolence, bleeding, or neurologic sequela. The patient is tolerating medications without difficulties and is otherwise without complaint today.    Atrial Fibrillation Risk Factors:  she does not have symptoms or diagnosis of sleep apnea. she does not have a history of rheumatic fever. she does not have a history of alcohol use. The patient does not have a history of early familial atrial fibrillation or other arrhythmias.  she has a BMI of Body mass index is 23.43 kg/m.Marland Kitchen Filed Weights   06/25/21 0956  Weight: 55.3 kg    Family History  Problem Relation Age of Onset   Hypertension Mother    Hyperlipidemia Mother    Cancer Mother        breast/colon   Heart disease Mother    Diabetes Father    Emphysema Father    Hyperlipidemia Sister    Hypertension Sister    Hypothyroidism Sister      Atrial Fibrillation Management history:  Previous antiarrhythmic drugs: flecainide Previous cardioversions: 12/29/18, 01/04/19 Previous ablations: 02/08/19 CHADS2VASC score: 4 (female, age, HTN) Anticoagulation history: Eliquis    Past Medical History:  Diagnosis Date   Allergy    Anemia    Atypical mole 10/20/2001   Left Mid to Upper Back-Slight to Moderate(no treatment)   BCC (basal cell  carcinoma of skin) 02/07/2004   Left Shin(excision) Dr. 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 Tonia Brooms   SCCA (squamous cell carcinoma) of skin 08/03/2000   Left Shin([place]) Dr. 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 Tonia Brooms   SCCA (squamous cell carcinoma) of skin 09/17/2000   Right Forearm([place]) Dr. Susa Simmonds   SCCA (squamous cell carcinoma) of skin 09/17/2000   Right Lower Leg([place]) Dr. Susa Simmonds   SCCA (squamous cell carcinoma) of skin 09/17/2000   Left Upper Lower Leg,Lower Knee([place]) Dr. Susa Simmonds   SCCA (squamous cell carcinoma) of skin 09/17/2000   Left Lower Medial Leg([place]) Dr. Susa Simmonds   SCCA (squamous cell carcinoma) of skin 10/13/2000   Left Medial Ankle([place]) Dr. Susa Simmonds (excision)   SCCA (squamous cell carcinoma) of skin 10/13/2000   Right Lateral Ankle([place]) Dr. Susa Simmonds (excision)   SCCA (squamous cell carcinoma) of skin 12/03/2000   Left Upper Arm([place]) Dr. Susa Simmonds   SCCA (squamous cell carcinoma) of skin 12/03/2000   Left Med. Lower Leg([place]) Dr. Susa Simmonds (excision)   SCCA (squamous cell carcinoma) of skin 12/11/2000   Left Upper Thigh([place]) Dr. Susa Simmonds   SCCA (squamous cell carcinoma) of skin 12/15/2000   Left Thigh([place])  Dr. Susa Simmonds   SCCA (squamous cell carcinoma) of skin 01/18/2001   Left Forearm(in [place]) Dr. Tonia Brooms   SCCA (squamous cell carcinoma) of skin 01/18/2001   Left Hand 2nd/3rd map(in situ) Dr. Tonia Brooms)   SCCA (squamous cell carcinoma) of skin  01/18/2001   Left Shin Lat(in situ) Dr. 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 Tonia Brooms   SCCA (squamous cell carcinoma) of skin 10/20/2001   Right Ear Lobe([place]) Dr. Tonia Brooms (exc)   SCCA (squamous cell carcinoma) of skin 02/02/2002   Left Lower Leg Post Sup(in situ) Dr. 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 Tonia Brooms   SCCA (squamous cell carcinoma) of skin 04/03/2009   Left Post Shoulder(in situ) treatment curet and 5FU   SCCA (squamous cell carcinoma) of skin 04/03/2009   Left Outer Thigh(in situ) treatment curet and 5FU   SCCA (squamous cell carcinoma) of skin 04/03/2009   Right Lower Leg(in situ) treatment curet and 5FU   SCCA (squamous cell carcinoma) of skin 07/08/2016   Left Inner Knee(in situ) treatment after biopsy   SCCA (squamous cell carcinoma) of skin 10/18/2019   Right Upper Arm - CX3 + 5FU   Superficial basal cell carcinoma (BCC) 02/19/2000   Left Forearm (Dr. Tonia Brooms)   Past Surgical History:  Procedure Laterality Date   APPENDECTOMY     ATRIAL FIBRILLATION ABLATION N/A 02/08/2019   Procedure: ATRIAL FIBRILLATION ABLATION;  Surgeon: Thompson Grayer, MD;  Location: [facility];  Service: Cardiovascular;  Laterality: N/A;   BREAST BIOPSY     CARDIOVERSION N/A 12/29/2018   Procedure: CARDIOVERSION;  Surgeon: Dorothy Spark, MD;  Location: [facility];  Service: Cardiovascular;  Laterality: N/A;   CATARACT EXTRACTION  05/05/13   right eye    COLONOSCOPY  2006   ECTOPIC PREGNANCY SURGERY  1970   WISDOM TOOTH EXTRACTION      Current Outpatient Medications  Medication Sig Dispense Refill   alendronate (FOSAMAX) 35 MG tablet Take 35 mg by mouth every Tuesday. Take with a full glass of water on an empty stomach.     atorvastatin (LIPITOR) 10 MG tablet Take 1 tablet (10 mg total) by mouth daily. 90 tablet 3   Cholecalciferol (D3 VITAMIN PO) Take 10 mcg by mouth 2  (two) times daily.     diltiazem (CARDIZEM CD) 240 MG 24 hr capsule TAKE 1 CAPSULE EVERY DAY 90 capsule 3   ELIQUIS 2.5 MG TABS tablet TAKE 1 TABLET (2.5 MG TOTAL) BY MOUTH 2 (TWO) TIMES DAILY. 180 tablet 2   lisinopril-hydrochlorothiazide (ZESTORETIC) 10-12.5 MG tablet TAKE 1 TABLET EVERY DAY 90 tablet 0   Multiple Vitamins-Minerals (MULTIVITAMIN WITH MINERALS) tablet Take 1 tablet by mouth daily with supper.      Omega-3 Fatty Acids (FISH OIL) 1200 MG CAPS Take 1,200 mg by mouth daily with supper.      Polyethyl Glycol-Propyl Glycol 0.4-0.3 % SOLN Place 1 drop into both eyes 3 (three) times daily as needed (for dry/irritated eyes.).     sodium chloride (OCEAN) 0.65 % SOLN nasal spray Place 1 spray into both nostrils 4 (four) times daily as needed for congestion (allergies.).      diltiazem (CARDIZEM) 30 MG tablet Take 1 tablet (30 mg total) by mouth as needed (afib). 90 tablet 2   No current facility-administered medications for this encounter.    Allergies  Allergen Reactions   Clindamycin Hcl Nausea  And Vomiting    nervous   Codeine Nausea And Vomiting   Erythromycin Nausea And Vomiting   Tetracycline Nausea And Vomiting   Penicillins Rash    Did it involve swelling of the face/tongue/throat, SOB, or low BP? No Did it involve sudden or severe rash/hives, skin peeling, or any reaction on the inside of your mouth or nose? Yes Did you need to seek medical attention at a hospital or doctor's office? Yes When did it last happen?     childhood  If all above answers are "NO", may proceed with cephalosporin use.    Sulfonamide Derivatives Nausea And Vomiting and Rash    Social History   Socioeconomic History   Marital status: Married    Spouse name: Not on file   Number of children: Not on file   Years of education: Not on file   Highest education level: Not on file  Occupational History   Not on file  Tobacco Use   Smoking status: Never   Smokeless tobacco: Never  Vaping Use    Vaping Use: Never used  Substance and Sexual Activity   Alcohol use: No   Drug use: No   Sexual activity: Yes  Other Topics Concern   Not on file  Social History Narrative   HS[employer], [employer]-elementary ed   Married '64   3 sons-'66, '68, '71; 6 grandchildren   Work: taught school for 2 years , 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 Laymen's guide and forms provided.    Reviewed Aug '15      Exercsie:5 days a week, treadmill   Diet: healthy, weight watchers.         Attends [employer]   Social Determinants of Health   Financial Resource Strain: Not on file  Food Insecurity: Not on file  Transportation Needs: Not on file  Physical Activity: Not on file  Stress: Not on file  Social Connections: Not on file  Intimate Partner Violence: Not on file     ROS- All systems are reviewed and negative except as per the HPI above.  Physical Exam: Vitals:   06/25/21 0956  BP: 140/64  Pulse: 66  Weight: 55.3 kg  Height: 5' 0.5" (1.537 m)   GEN- The patient is a well appearing elderly female, alert and oriented x 3 today.   HEENT-head normocephalic, atraumatic, sclera clear, conjunctiva pink, hearing intact, trachea midline. Lungs- Clear to ausculation bilaterally, normal work of breathing Heart- Regular rate and rhythm, no rubs or gallops, 2/6 systolic murmur  GI- soft, NT, ND, + BS Extremities- no clubbing, cyanosis, or edema MS- no significant deformity or atrophy Skin- no rash or lesion Psych- euthymic mood, full affect Neuro- strength and sensation are intact   Wt Readings from Last 3 Encounters:  06/25/21 55.3 kg  02/08/21 54.4 kg  12/24/20 56.6 kg    EKG today demonstrates  SR, PAC Vent. rate 66 BPM PR interval 180 ms QRS duration 80 ms QT/QTcB 402/421 ms  Echo 06/03/21 demonstrated  1. Left ventricular ejection fraction, by estimation, is 65 to 70%. The   left ventricle has normal function. The left ventricle has no regional  wall motion abnormalities. Left ventricular diastolic parameters were  normal.   2. Right ventricular systolic function is normal. The right ventricular  size is normal.   3. The mitral valve is normal in structure. Mild mitral  valve  regurgitation.   4. The aortic valve is normal in structure. Aortic valve regurgitation is  not visualized.   Epic records are reviewed at length today   CHA2DS2-VASc Score = 4  The patient's score is based upon: CHF History: 0 HTN History: 1 Diabetes History: 0 Stroke History: 0 Vascular Disease History: 0 Age Score: 2 Gender Score: 1        ASSESSMENT AND PLAN: 1. Paroxysmal Atrial Fibrillation (ICD10:  I48.0) The patient's CHA2DS2-VASc score is 4, indicating a 4.8% annual risk of stroke.   S/p ablation 02/08/19 Patient appears to be maintaining SR.  Continue Eliquis 2.5 mg BID Continue diltiazem 240 mg daily with 30 mg PRN q 4 hours for heart racing.  2. Secondary Hypercoagulable State (ICD10:  D68.69) The patient is at significant risk for stroke/thromboembolism based upon her CHA2DS2-VASc Score of 4.  Continue Apixaban (Eliquis).   3. HTN Stable, no changes today.   Follow up in the AF clinic in 6 months.    [facility] [address] [place], [place] [address] [phone] 06/25/2021 10:43 AM

## 2021-06-26 ENCOUNTER — Other Ambulatory Visit (HOSPITAL_COMMUNITY): Payer: Self-pay | Admitting: *Deleted

## 2021-06-26 MED ORDER — DILTIAZEM HCL 30 MG PO TABS: ORAL_TABLET | ORAL | 1 refills | Status: DC

## 2021-07-24 ENCOUNTER — Other Ambulatory Visit: Payer: Self-pay | Admitting: Family Medicine

## 2021-07-28 NOTE — Progress Notes (Signed)
Subjective:   Alexandria Mcdowell is a 80 y.o. female who presents for Medicare Annual (Subsequent) preventive examination.  I connected with Alexandria Mcdowell today by telephone and verified that I am speaking with the correct person using two identifiers. Location patient: home Location provider: work Persons participating in the virtual visit: patient, Marine scientist.    I discussed the limitations, risks, security and privacy concerns of performing an evaluation and management service by telephone and the availability of in person appointments. I also discussed with the patient that there may be a patient responsible charge related to this service. The patient expressed understanding and verbally consented to this telephonic visit.    Interactive audio and video telecommunications were attempted between this provider and patient, however failed, due to patient having technical difficulties OR patient did not have access to video capability.  We continued and completed visit with audio only.  Some vital signs may be absent or patient reported.   Time Spent with patient on telephone encounter: 25 minutes  Review of Systems     Cardiac Risk Factors include: advanced age (>72men, >69 women);dyslipidemia;hypertension     Objective:    Today's Vitals   07/29/21 1357  Weight: 124 lb (56.2 kg)  Height: 5' (1.524 m)   Body mass index is 24.22 kg/m.  Advanced Directives 07/29/2021 07/01/2019 02/08/2019 01/25/2019 12/29/2018 12/28/2018 12/28/2018  Does Patient Have a Medical Advance Directive? Yes Yes Yes Yes No - No  Type of Paramedic of [place];Living will [place];Living will Living will;Healthcare Power of [place];Living will - - -  Does patient want to make changes to medical advance directive? Yes (MAU/Ambulatory/Procedural Areas - Information given) - - - - - -  Copy of [place] in Chart? Yes -  validated most recent copy scanned in chart (See row information) Yes - validated most recent copy scanned in chart (See row information) No - copy requested - - - -  Would patient like information on creating a medical advance directive? - - - - No - Guardian declined No - Patient declined -    Current Medications (verified) Outpatient Encounter Medications as of 07/29/2021  Medication Sig   alendronate (FOSAMAX) 35 MG tablet Take 35 mg by mouth every Tuesday. Take with a full glass of water on an empty stomach.   atorvastatin (LIPITOR) 10 MG tablet TAKE 1 TABLET EVERY DAY   Cholecalciferol (D3 VITAMIN PO) Take 10 mcg by mouth 2 (two) times daily.   diltiazem (CARDIZEM CD) 240 MG 24 hr capsule TAKE 1 CAPSULE EVERY DAY   diltiazem (CARDIZEM) 30 MG tablet Take 1 tablet every 4 hours AS NEEDED for heart rate >100   ELIQUIS 2.5 MG TABS tablet TAKE 1 TABLET (2.5 MG TOTAL) BY MOUTH 2 (TWO) TIMES DAILY.   lisinopril-hydrochlorothiazide (ZESTORETIC) 10-12.5 MG tablet TAKE 1 TABLET EVERY DAY   Multiple Vitamins-Minerals (MULTIVITAMIN WITH MINERALS) tablet Take 1 tablet by mouth daily with supper.    Omega-3 Fatty Acids (FISH OIL) 1200 MG CAPS Take 1,200 mg by mouth daily with supper.    Polyethyl Glycol-Propyl Glycol 0.4-0.3 % SOLN Place 1 drop into both eyes 3 (three) times daily as needed (for dry/irritated eyes.).   sodium chloride (OCEAN) 0.65 % SOLN nasal spray Place 1 spray into both nostrils 4 (four) times daily as needed for congestion (allergies.).    No facility-administered encounter medications on file as of 07/29/2021.    Allergies (verified)  Clindamycin hcl, Codeine, Erythromycin, Tetracycline, Penicillins, and Sulfonamide derivatives   History: Past Medical History:  Diagnosis Date   Allergy    Anemia    Atypical mole 10/20/2001   Left Mid to Upper Back-Slight to Moderate(no treatment)   BCC (basal cell carcinoma of skin) 02/07/2004   Left Shin(excision) Dr. 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 Tonia Brooms   SCCA (squamous cell carcinoma) of skin 08/03/2000   Left Shin([place]) Dr. 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 Tonia Brooms   SCCA (squamous cell carcinoma) of skin 09/17/2000   Right Forearm([place]) Dr. Susa Simmonds   SCCA (squamous cell carcinoma) of skin 09/17/2000   Right Lower Leg([place]) Dr. Susa Simmonds   SCCA (squamous cell carcinoma) of skin 09/17/2000   Left Upper Lower Leg,Lower Knee([place]) Dr. Susa Simmonds   SCCA (squamous cell carcinoma) of skin 09/17/2000   Left Lower Medial Leg([place]) Dr. Susa Simmonds   SCCA (squamous cell carcinoma) of skin 10/13/2000   Left Medial Ankle([place]) Dr. Susa Simmonds (excision)   SCCA (squamous cell carcinoma) of skin 10/13/2000   Right Lateral Ankle([place]) Dr. Susa Simmonds (excision)   SCCA (squamous cell carcinoma) of skin 12/03/2000   Left Upper Arm([place]) Dr. Susa Simmonds   SCCA (squamous cell carcinoma) of skin 12/03/2000   Left Med. Lower Leg([place]) Dr. Susa Simmonds (excision)   SCCA (squamous cell carcinoma) of skin 12/11/2000   Left Upper Thigh([place]) Dr. Susa Simmonds   SCCA (squamous cell carcinoma) of skin 12/15/2000   Left Thigh([place]) Dr. Susa Simmonds   SCCA (squamous cell carcinoma) of skin 01/18/2001   Left Forearm(in [place]) Dr. Tonia Brooms   SCCA (squamous cell carcinoma) of skin 01/18/2001   Left Hand 2nd/3rd map(in situ) Dr. 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 Tonia Brooms   SCCA (squamous  cell carcinoma) of skin 01/18/2001   Left Calf(in situ) Dr. Tonia Brooms   SCCA (squamous cell carcinoma) of skin 10/20/2001   Right Ear Lobe([place]) Dr. Tonia Brooms (exc)   SCCA (squamous cell carcinoma) of skin 02/02/2002   Left Lower Leg Post Sup(in situ) Dr. 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 Tonia Brooms   SCCA (squamous cell carcinoma) of skin 04/03/2009   Left Post Shoulder(in situ) treatment curet and 5FU   SCCA (squamous cell carcinoma) of skin 04/03/2009   Left Outer Thigh(in situ) treatment curet and 5FU   SCCA (squamous cell carcinoma) of skin 04/03/2009   Right Lower Leg(in situ) treatment curet and 5FU   SCCA (squamous cell carcinoma) of skin 07/08/2016   Left Inner Knee(in situ) treatment after biopsy   SCCA (squamous cell carcinoma) of skin 10/18/2019   Right Upper Arm - CX3 + 5FU   Superficial basal cell carcinoma (BCC) 02/19/2000   Left Forearm (Dr. Tonia Brooms)   Past Surgical History:  Procedure Laterality Date   APPENDECTOMY     ATRIAL FIBRILLATION ABLATION N/A 02/08/2019   Procedure: ATRIAL FIBRILLATION ABLATION;  Surgeon: Thompson Grayer, MD;  Location: [place] [facility];  Service: Cardiovascular;  Laterality: N/A;   BREAST BIOPSY     CARDIOVERSION N/A 12/29/2018  Procedure: CARDIOVERSION;  Surgeon: Dorothy Spark, MD;  Location: [facility];  Service: Cardiovascular;  Laterality: N/A;   CATARACT EXTRACTION  05/05/13   right eye    COLONOSCOPY  2006   ECTOPIC PREGNANCY SURGERY  1970   WISDOM TOOTH EXTRACTION     Family History  Problem Relation Age of Onset   Hypertension Mother    Hyperlipidemia Mother    Cancer Mother        breast/colon   Heart disease Mother    Diabetes Father    Emphysema Father    Hyperlipidemia Sister    Hypertension Sister    Hypothyroidism Sister    Social History   Socioeconomic History   Marital status: Married    Spouse name: Not on file   Number of children: Not on file    Years of education: Not on file   Highest education level: Not on file  Occupational History   Not on file  Tobacco Use   Smoking status: Never   Smokeless tobacco: Never  Vaping Use   Vaping Use: Never used  Substance and Sexual Activity   Alcohol use: No   Drug use: No   Sexual activity: Yes  Other Topics Concern   Not on file  Social History Narrative   HSG, [employer]-elementary ed   Married '64   3 sons-'66, '68, '71; 6 grandchildren   Work: taught school for 2 years , 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 Laymen's guide and forms provided.    Reviewed Aug '15      Exercsie:5 days a week, treadmill   Diet: healthy, weight watchers.         Attends [employer]   Social Determinants of Health   Financial Resource Strain: Low Risk    Difficulty of Paying Living Expenses: Not hard at all  Food Insecurity: No Food Insecurity   Worried About Charity fundraiser in the Last Year: Never true   Arboriculturist in the Last Year: Never true  Transportation Needs: No Transportation Needs   Lack of Transportation (Medical): No   Lack of Transportation (Non-Medical): No  Physical Activity: Inactive   Days of Exercise per Week: 0 days   Minutes of Exercise per Session: 0 min  Stress: No Stress Concern Present   Feeling of Stress : Not at all  Social Connections: Moderately Integrated   Frequency of Communication with Friends and Family: More than three times a week   Frequency of Social Gatherings with Friends and Family: Three times a week   Attends Religious Services: More than 4 times per year   Active Member of Clubs or Organizations: No   Attends Archivist Meetings: Never   Marital Status: Married    Tobacco Counseling Counseling given: Not Answered   Clinical Intake:  Pre-visit preparation completed: Yes  Pain : No/denies pain     BMI -  recorded: 23.96 Nutritional Status: BMI of 19-24  Normal Nutritional Risks: None Diabetes: No  How often do you need to have someone help you when you read instructions, pamphlets, or other written materials from your doctor or pharmacy?: 1 - Never  Diabetic?o  Information entered by :: Orrin Brigham LPN   Activities of Daily Living In your present state of health, do you have any difficulty performing the following activities: 07/29/2021  Hearing? Y  Comment  wears hearing aids in both ears  Vision? N  Difficulty concentrating or making decisions? N  Walking or climbing stairs? N  Dressing or bathing? N  Doing errands, shopping? N  Preparing Food and eating ? N  Using the Toilet? N  In the past six months, have you accidently leaked urine? N  Do you have problems with loss of bowel control? Y  Managing your Medications? N  Managing your Finances? N  Housekeeping or managing your Housekeeping? N  Some recent data might be hidden    Patient Care Team: Jinny Sanders, MD as PCP - General (Family Medicine) Josue Hector, MD as PCP - Cardiology (Cardiology) Lavonna Monarch, MD (Dermatology) Madilyn Hook, [place] as Consulting Physician (Optometry) Teena Irani, MD (Inactive) as Consulting Physician (Gastroenterology)  Indicate any recent Medical Services you may have received from other than [facility] providers in the past year (date may be approximate).     Assessment:   This is a routine wellness examination for Alexandria Mcdowell.  Hearing/Vision screen Hearing Screening - Comments:: Wears hearing aids in both ears Vision Screening - Comments:: Last exam 2021, plans to schedule an appointment @ [facility] care  Dietary issues and exercise activities discussed: Current Exercise Habits: The patient does not participate in regular exercise at present   Goals Addressed             This Visit's Progress    Patient Stated       Would like to continue drinking more water and eating  healthier. Would like to start going back to the [facility]       Depression Screen PHQ 2/9 Scores 07/29/2021 07/13/2020 07/01/2019 06/23/2018 06/17/2017 05/20/2016 05/18/2015  PHQ - 2 Score 0 0 0 0 0 0 0  PHQ- 9 Score - - 0 0 0 - -    Fall Risk Fall Risk  07/29/2021 07/13/2020 07/01/2019 06/23/2018 06/17/2017  Falls in the past year? 0 0 1 No No  Number falls in past yr: 0 - 0 - -  Injury with Fall? 0 - 0 - -  Risk for fall due to : - - Medication side effect - -  Follow up Falls prevention discussed - Falls evaluation completed;Falls prevention discussed - -    FALL RISK PREVENTION PERTAINING TO THE HOME:  Any stairs in or around the home? Yes  If so, are there any without handrails? No  Home free of loose throw rugs in walkways, pet beds, electrical cords, etc? No  Adequate lighting in your home to reduce risk of falls? Yes   ASSISTIVE DEVICES UTILIZED TO PREVENT FALLS:  Life alert? No  Use of a cane, walker or w/c? No  Grab bars in the bathroom? Yes  Shower chair or bench in shower? Yes  Elevated toilet seat or a handicapped toilet? No   TIMED UP AND GO:  Was the test performed? No , visit completed over the phone.   Cognitive Function: Normal cognitive status assessed by this Nurse Health Advisor. No abnormalities found.   MMSE - Mini Mental State Exam 07/01/2019 06/23/2018 06/17/2017 05/20/2016  Orientation to time 5 5 5 5   Orientation to Place 5 5 5 5   Registration 3 3 3 3   Attention/ Calculation 5 0 0 0  Recall 3 3 3 3   Language- name 2 objects - 0 0 0  Language- repeat 1 1 1 1   Language- follow 3 step command - 3 3 3   Language- read & follow direction - 0  0 0  Write a sentence - 0 0 0  Copy design - 0 0 0  Total score - 20 20 20         Immunizations Immunization History  Administered Date(s) Administered   Fluad Quad(high Dose 65+) 05/26/2020   Influenza Split 06/19/2011, 06/22/2012   Influenza Whole 06/29/2007, 06/20/2008, 06/13/2010   Influenza, High Dose  Seasonal PF 06/06/2017, 06/15/2018   Influenza,inj,Quad PF,6+ Mos 05/17/2013, 05/12/2014, 05/18/2015, 05/20/2016, 05/10/2019   Influenza-Unspecified 06/07/2021   PFIZER(Purple Top)SARS-COV-2 Vaccination 10/02/2019, 10/23/2019, 06/11/2020, 12/28/2020   Pfizer Covid-19 Vaccine Bivalent Booster 42yrs & up 06/07/2021   Pneumococcal Conjugate-13 05/18/2015   Pneumococcal Polysaccharide-23 02/23/2007   Td 01/30/2003   Tetanus 05/10/2013    TDAP status: Up to date  Flu Vaccine status: Up to date  Pneumococcal vaccine status: Up to date  Covid-19 vaccine status: Completed vaccines  Qualifies for Shingles Vaccine? Yes   Zostavax completed No   Shingrix Completed?: No.    Education has been provided regarding the importance of this vaccine. Patient has been advised to call insurance company to determine out of pocket expense if they have not yet received this vaccine. Advised may also receive vaccine at local pharmacy or Health Dept. Verbalized acceptance and understanding.  Screening Tests Health Maintenance  Topic Date Due   Zoster Vaccines- Shingrix (1 of 2) Never done   MAMMOGRAM  03/20/2019   TETANUS/TDAP  05/11/2023   Pneumonia Vaccine 21+ Years old  Completed   INFLUENZA VACCINE  Completed   DEXA SCAN  Completed   COVID-19 Vaccine  Completed   HPV VACCINES  Aged Out    Health Maintenance  Health Maintenance Due  Topic Date Due   Zoster Vaccines- Shingrix (1 of 2) Never done   MAMMOGRAM  03/20/2019    Colorectal cancer screening: No longer required.   Mammogram status:  Bone Density status:  Lung Cancer Screening: (Low Dose CT Chest recommended if Age 73-80 years, 30 pack-year currently smoking OR have quit w/in 15years.) does not qualify.     Additional Screening:  Hepatitis C Screening: does not qualify;   Vision Screening: Recommended annual ophthalmology exams for early detection of glaucoma and other disorders of the eye. Is the patient up to date with  their annual eye exam?  No  patient plans on scheduling an appointment Who is the provider or what is the name of the office in which the patient attends annual eye exams? [facility]   Dental Screening: Recommended annual dental exams for proper oral hygiene  Community Resource Referral / Chronic Care Management: CRR required this visit?  No   CCM required this visit?  No      Plan:     I have personally reviewed and noted the following in the patient's chart:   Medical and social history Use of alcohol, tobacco or illicit drugs  Current medications and supplements including opioid prescriptions.  Functional ability and status Nutritional status Physical activity Advanced directives List of other physicians Hospitalizations, surgeries, and ER visits in previous 12 months Vitals Screenings to include cognitive, depression, and falls Referrals and appointments  In addition, I have reviewed and discussed with patient certain preventive protocols, quality metrics, and best practice recommendations. A written personalized care plan for preventive services as well as general preventive health recommendations were provided to patient.   Due to this being a telephonic visit, the after visit summary with patients personalized plan was offered to patient via mail or my-chart. Patient preferred  to pick up at office at next visit.   Loma Messing, LPN [id number]    Nurse health Advisor   Nurse Notes: none

## 2021-07-29 ENCOUNTER — Ambulatory Visit (INDEPENDENT_AMBULATORY_CARE_PROVIDER_SITE_OTHER): Payer: Medicare HMO

## 2021-07-29 VITALS — Ht 60.0 in | Wt 124.0 lb

## 2021-07-29 DIAGNOSIS — Z0000 Encounter for general adult medical examination without abnormal findings: Secondary | ICD-10-CM

## 2021-07-29 DIAGNOSIS — Z6823 Body mass index (BMI) 23.0-23.9, adult: Secondary | ICD-10-CM | POA: Diagnosis not present

## 2021-07-29 DIAGNOSIS — Z124 Encounter for screening for malignant neoplasm of cervix: Secondary | ICD-10-CM | POA: Diagnosis not present

## 2021-07-29 DIAGNOSIS — Z1231 Encounter for screening mammogram for malignant neoplasm of breast: Secondary | ICD-10-CM | POA: Diagnosis not present

## 2021-07-29 NOTE — Patient Instructions (Signed)
[name] , Thank you for taking time to complete your Medicare Wellness Visit. I appreciate your ongoing commitment to your health goals. Please review the following plan we discussed and let me know if I can assist you in the future.   Screening recommendations/referrals: Colonoscopy: no longer required Mammogram: up to date, completed 07/29/21, due 07/29/22 Bone Density: up to date, completed 2021 per our conversation, report will be sent for your chart Recommended yearly ophthalmology/optometry visit for glaucoma screening and checkup Recommended yearly dental visit for hygiene and checkup  Vaccinations: Influenza vaccine: up to date, per our conversation completed 06/07/21 Pneumococcal vaccine: up to date Tdap vaccine: up to date, completed 05/10/13, due 05/11/23 Shingles vaccine: Discuss with your local pharmacy if you decide to schedule  Covid-19: per our conversation completed 06/07/21  Advanced directives: copy on file  Conditions/risks identified: see problem list  Next appointment: Follow up in one year for your annual wellness visit    Preventive Care 32 Years and Older, Female Preventive care refers to lifestyle choices and visits with your health care provider that can promote health and wellness. What does preventive care include? A yearly physical exam. This is also called an annual well check. Dental exams once or twice a year. Routine eye exams. Ask your health care provider how often you should have your eyes checked. Personal lifestyle choices, including: Daily care of your teeth and gums. Regular physical activity. Eating a healthy diet. Avoiding tobacco and drug use. Limiting alcohol use. Practicing safe sex. Taking low-dose aspirin every day. Taking vitamin and mineral supplements as recommended by your health care provider. What happens during an annual well check? The services and screenings done by your health care provider during your annual well  check will depend on your age, overall health, lifestyle risk factors, and family history of disease. Counseling  Your health care provider may ask you questions about your: Alcohol use. Tobacco use. Drug use. Emotional well-being. Home and relationship well-being. Sexual activity. Eating habits. History of falls. Memory and ability to understand (cognition). Work and work Statistician. Reproductive health. Screening  You may have the following tests or measurements: Height, weight, and BMI. Blood pressure. Lipid and cholesterol levels. These may be checked every 5 years, or more frequently if you are over 72 years old. Skin check. Lung cancer screening. You may have this screening every year starting at age 54 if you have a 30-pack-year history of smoking and currently smoke or have quit within the past 15 years. Fecal occult blood test (FOBT) of the stool. You may have this test every year starting at age 61. Flexible sigmoidoscopy or colonoscopy. You may have a sigmoidoscopy every 5 years or a colonoscopy every 10 years starting at age 66. Hepatitis C blood test. Hepatitis B blood test. Sexually transmitted disease (STD) testing. Diabetes screening. This is done by checking your blood sugar (glucose) after you have not eaten for a while (fasting). You may have this done every 1-3 years. Bone density scan. This is done to screen for osteoporosis. You may have this done starting at age 10. Mammogram. This may be done every 1-2 years. Talk to your health care provider about how often you should have regular mammograms. Talk with your health care provider about your test results, treatment options, and if necessary, the need for more tests. Vaccines  Your health care provider may recommend certain vaccines, such as: Influenza vaccine. This is recommended every year. Tetanus, diphtheria, and acellular pertussis (Tdap, Td) vaccine.  You may need a Td booster every 10 years. Zoster  vaccine. You may need this after age 16. Pneumococcal 13-valent conjugate (PCV13) vaccine. One dose is recommended after age 84. Pneumococcal polysaccharide (PPSV23) vaccine. One dose is recommended after age 9. Talk to your health care provider about which screenings and vaccines you need and how often you need them. This information is not intended to replace advice given to you by your health care provider. Make sure you discuss any questions you have with your health care provider. Document Released: 09/28/2015 Document Revised: 05/21/2016 Document Reviewed: 07/03/2015 Elsevier Interactive Patient Education  2017 [employer] Prevention in the Home Falls can cause injuries. They can happen to people of all ages. There are many things you can do to make your home safe and to help prevent falls. What can I do on the outside of my home? Regularly fix the edges of walkways and driveways and fix any cracks. Remove anything that might make you trip as you walk through a door, such as a raised step or threshold. Trim any bushes or trees on the path to your home. Use bright outdoor lighting. Clear any walking paths of anything that might make someone trip, such as rocks or tools. Regularly check to see if handrails are loose or broken. Make sure that both sides of any steps have handrails. Any raised decks and porches should have guardrails on the edges. Have any leaves, snow, or ice cleared regularly. Use sand or salt on walking paths during winter. Clean up any spills in your garage right away. This includes oil or grease spills. What can I do in the bathroom? Use night lights. Install grab bars by the toilet and in the tub and shower. Do not use towel bars as grab bars. Use non-skid mats or decals in the tub or shower. If you need to sit down in the shower, use a plastic, non-slip stool. Keep the floor dry. Clean up any water that spills on the floor as soon as it happens. Remove  soap buildup in the tub or shower regularly. Attach bath mats securely with double-sided non-slip rug tape. Do not have throw rugs and other things on the floor that can make you trip. What can I do in the bedroom? Use night lights. Make sure that you have a light by your bed that is easy to reach. Do not use any sheets or blankets that are too big for your bed. They should not hang down onto the floor. Have a firm chair that has side arms. You can use this for support while you get dressed. Do not have throw rugs and other things on the floor that can make you trip. What can I do in the kitchen? Clean up any spills right away. Avoid walking on wet floors. Keep items that you use a lot in easy-to-reach places. If you need to reach something above you, use a strong step stool that has a grab bar. Keep electrical cords out of the way. Do not use floor polish or wax that makes floors slippery. If you must use wax, use non-skid floor wax. Do not have throw rugs and other things on the floor that can make you trip. What can I do with my stairs? Do not leave any items on the stairs. Make sure that there are handrails on both sides of the stairs and use them. Fix handrails that are broken or loose. Make sure that handrails are as long as  the stairways. Check any carpeting to make sure that it is firmly attached to the stairs. Fix any carpet that is loose or worn. Avoid having throw rugs at the top or bottom of the stairs. If you do have throw rugs, attach them to the floor with carpet tape. Make sure that you have a light switch at the top of the stairs and the bottom of the stairs. If you do not have them, ask someone to add them for you. What else can I do to help prevent falls? Wear shoes that: Do not have high heels. Have rubber bottoms. Are comfortable and fit you well. Are closed at the toe. Do not wear sandals. If you use a stepladder: Make sure that it is fully opened. Do not climb a  closed stepladder. Make sure that both sides of the stepladder are locked into place. Ask someone to hold it for you, if possible. Clearly mark and make sure that you can see: Any grab bars or handrails. First and last steps. Where the edge of each step is. Use tools that help you move around (mobility aids) if they are needed. These include: Canes. Walkers. Scooters. Crutches. Turn on the lights when you go into a dark area. Replace any light bulbs as soon as they burn out. Set up your furniture so you have a clear path. Avoid moving your furniture around. If any of your floors are uneven, fix them. If there are any pets around you, be aware of where they are. Review your medicines with your doctor. Some medicines can make you feel dizzy. This can increase your chance of falling. Ask your doctor what other things that you can do to help prevent falls. This information is not intended to replace advice given to you by your health care provider. Make sure you discuss any questions you have with your health care provider. Document Released: 06/28/2009 Document Revised: 02/07/2016 Document Reviewed: 10/06/2014 Elsevier Interactive Patient Education  2017 [employer].

## 2021-07-30 ENCOUNTER — Ambulatory Visit: Payer: Medicare HMO

## 2021-08-06 ENCOUNTER — Ambulatory Visit (INDEPENDENT_AMBULATORY_CARE_PROVIDER_SITE_OTHER): Payer: Medicare HMO | Admitting: Family Medicine

## 2021-08-06 ENCOUNTER — Encounter: Payer: Self-pay | Admitting: Family Medicine

## 2021-08-06 ENCOUNTER — Other Ambulatory Visit: Payer: Self-pay

## 2021-08-06 VITALS — BP 100/60 | HR 82 | Temp 98.2°F | Ht 60.25 in | Wt 119.4 lb

## 2021-08-06 DIAGNOSIS — M8580 Other specified disorders of bone density and structure, unspecified site: Secondary | ICD-10-CM

## 2021-08-06 DIAGNOSIS — I480 Paroxysmal atrial fibrillation: Secondary | ICD-10-CM | POA: Diagnosis not present

## 2021-08-06 DIAGNOSIS — Z0000 Encounter for general adult medical examination without abnormal findings: Secondary | ICD-10-CM

## 2021-08-06 DIAGNOSIS — I10 Essential (primary) hypertension: Secondary | ICD-10-CM | POA: Diagnosis not present

## 2021-08-06 DIAGNOSIS — E7800 Pure hypercholesterolemia, unspecified: Secondary | ICD-10-CM

## 2021-08-06 DIAGNOSIS — R7303 Prediabetes: Secondary | ICD-10-CM

## 2021-08-06 LAB — COMPREHENSIVE METABOLIC PANEL
ALT: 32 U/L (ref 0–35)
AST: 31 U/L (ref 0–37)
Albumin: 4.5 g/dL (ref 3.5–5.2)
Alkaline Phosphatase: 110 U/L (ref 39–117)
BUN: 16 mg/dL (ref 6–23)
CO2: 30 mEq/L (ref 19–32)
Calcium: 10 mg/dL (ref 8.4–10.5)
Chloride: 98 mEq/L (ref 96–112)
Creatinine, Ser: 0.94 mg/dL (ref 0.40–1.20)
GFR: 57.13 mL/min — ABNORMAL LOW (ref >60.00)
Glucose, Bld: 110 mg/dL — ABNORMAL HIGH (ref 70–99)
Potassium: 3.7 mEq/L (ref 3.5–5.1)
Sodium: 136 mEq/L (ref 135–145)
Total Bilirubin: 0.6 mg/dL (ref 0.2–1.2)
Total Protein: 7.5 g/dL (ref 6.0–8.3)

## 2021-08-06 LAB — LIPID PANEL
Cholesterol: 163 mg/dL (ref 0–200)
HDL: 57.9 mg/dL (ref >39.00)
LDL Cholesterol: 76 mg/dL (ref 0–99)
NonHDL: 105.32
Total CHOL/HDL Ratio: 3
Triglycerides: 145 mg/dL (ref 0.0–149.0)
VLDL: 29 mg/dL (ref 0.0–40.0)

## 2021-08-06 LAB — HEMOGLOBIN A1C: Hgb A1c MFr Bld: 5.5 % (ref 4.6–6.5)

## 2021-08-06 NOTE — Progress Notes (Signed)
Patient ID: Alexandria Mcdowell, female    DOB: Aug 23, 1941, 80 y.o.   MRN: [id number]  This visit was conducted in person.  BP 100/60   Pulse 82   Temp 98.2 F (36.8 C) (Temporal)   Ht 5' 0.25" (1.53 m)   Wt 119 lb 6 oz (54.1 kg)   SpO2 97%   BMI 23.12 kg/m    CC: Chief Complaint  Patient presents with   Annual Exam    Part 2    Subjective:   HPI: Alexandria Mcdowell is a 80 y.o. female presenting on 08/06/2021 for Annual Exam (Part 2)  The patient presents for complete physical and review of chronic health problems. He/She also has the following acute concerns today: none  The patient saw a LPN or RN for medicare wellness visit.  Prevention and wellness was reviewed in detail. Note reviewed and important notes copied below.  Hypertension:   Good control on lisinopril HCTZ,  and diltiazem BP Readings from Last 3 Encounters:  08/06/21 100/60  06/25/21 140/64  02/08/21 136/83  Using medication without problems or lightheadedness:  none Chest pain with exertion:none Edema:none Short of breath: none Average home BPs: Other issues:    Walking, but has not gotten back to the [facility].  Reviewed last OV note from  Mr. Fento PA,  4/11/2022Dr. Allred ( cardiology) for paroxsysaml afib. S/p ablation 02/08/19  No longer on flecanide Continue Eliquis 5 mg BID Continue diltiazem 240 mg daily with 30 mg PRN q 4 hours for heart racing.   Prediabetes and cholesterol.. due for labs.      Relevant past medical, surgical, family and social history reviewed and updated as indicated. Interim medical history since our last visit reviewed. Allergies and medications reviewed and updated. Outpatient Medications Prior to Visit  Medication Sig Dispense Refill   alendronate (FOSAMAX) 35 MG tablet Take 35 mg by mouth every Tuesday. Take with a full glass of water on an empty stomach.     atorvastatin (LIPITOR) 10 MG tablet TAKE 1 TABLET EVERY DAY 90 tablet 0   Cholecalciferol (D3 VITAMIN  PO) Take 10 mcg by mouth 2 (two) times daily.     diltiazem (CARDIZEM CD) 240 MG 24 hr capsule TAKE 1 CAPSULE EVERY DAY 90 capsule 3   diltiazem (CARDIZEM) 30 MG tablet Take 1 tablet every 4 hours AS NEEDED for heart rate >100 90 tablet 1   ELIQUIS 2.5 MG TABS tablet TAKE 1 TABLET (2.5 MG TOTAL) BY MOUTH 2 (TWO) TIMES DAILY. 180 tablet 2   lisinopril-hydrochlorothiazide (ZESTORETIC) 10-12.5 MG tablet TAKE 1 TABLET EVERY DAY 90 tablet 0   Multiple Vitamins-Minerals (MULTIVITAMIN WITH MINERALS) tablet Take 1 tablet by mouth daily with supper.      Omega-3 Fatty Acids (FISH OIL) 1200 MG CAPS Take 1,200 mg by mouth daily with supper.      Polyethyl Glycol-Propyl Glycol 0.4-0.3 % SOLN Place 1 drop into both eyes 3 (three) times daily as needed (for dry/irritated eyes.).     sodium chloride (OCEAN) 0.65 % SOLN nasal spray Place 1 spray into both nostrils 4 (four) times daily as needed for congestion (allergies.).      No facility-administered medications prior to visit.     Per HPI unless specifically indicated in ROS section below Review of Systems  Constitutional:  Negative for fatigue and fever.  HENT:  Negative for congestion.   Eyes:  Negative for pain.  Respiratory:  Negative for cough and shortness of breath.  Cardiovascular:  Negative for chest pain, palpitations and leg swelling.  Gastrointestinal:  Negative for abdominal pain.  Genitourinary:  Negative for dysuria and vaginal bleeding.  Musculoskeletal:  Negative for back pain.  Neurological:  Negative for syncope, light-headedness and headaches.  Psychiatric/Behavioral:  Negative for dysphoric mood.   Objective:  BP 100/60   Pulse 82   Temp 98.2 F (36.8 C) (Temporal)   Ht 5' 0.25" (1.53 m)   Wt 119 lb 6 oz (54.1 kg)   SpO2 97%   BMI 23.12 kg/m   Wt Readings from Last 3 Encounters:  08/06/21 119 lb 6 oz (54.1 kg)  07/29/21 124 lb (56.2 kg)  06/25/21 122 lb (55.3 kg)      Physical Exam Vitals and nursing note reviewed.   Constitutional:      General: She is not in acute distress.    Appearance: Normal appearance. She is well-developed. She is not ill-appearing or toxic-appearing.  HENT:     Head: Normocephalic.     Right Ear: Hearing, tympanic membrane, ear canal and external ear normal.     Left Ear: Hearing, tympanic membrane, ear canal and external ear normal.     Nose: Nose normal.  Eyes:     General: Lids are normal. Lids are everted, no foreign bodies appreciated.     Conjunctiva/sclera: Conjunctivae normal.     Pupils: Pupils are equal, round, and reactive to light.  Neck:     Thyroid: No thyroid mass or thyromegaly.     Vascular: No carotid bruit.     Trachea: Trachea normal.  Cardiovascular:     Rate and Rhythm: Normal rate and regular rhythm.     Heart sounds: Normal heart sounds, S1 normal and S2 normal. No murmur heard.   No gallop.  Pulmonary:     Effort: Pulmonary effort is normal. No respiratory distress.     Breath sounds: Normal breath sounds. No wheezing, rhonchi or rales.  Abdominal:     General: Bowel sounds are normal. There is no distension or abdominal bruit.     Palpations: Abdomen is soft. There is no fluid wave or mass.     Tenderness: There is no abdominal tenderness. There is no guarding or rebound.     Hernia: No hernia is present.  Musculoskeletal:     Cervical back: Normal range of motion and neck supple.  Lymphadenopathy:     Cervical: No cervical adenopathy.  Skin:    General: Skin is warm and dry.     Findings: No rash.  Neurological:     Mental Status: She is alert.     Cranial Nerves: No cranial nerve deficit.     Sensory: No sensory deficit.  Psychiatric:        Mood and Affect: Mood is not anxious or depressed.        Speech: Speech normal.        Behavior: Behavior normal. Behavior is cooperative.        Judgment: Judgment normal.      Results for orders placed or performed during the hospital encounter of 40/98/11  Basic metabolic panel  Result  Value Ref Range   Sodium 136 135 - 145 mmol/L   Potassium 3.7 3.5 - 5.1 mmol/L   Chloride 99 98 - 111 mmol/L   CO2 29 22 - 32 mmol/L   Glucose, Bld 90 70 - 99 mg/dL   BUN 12 8 - 23 mg/dL   Creatinine, Ser 0.82 0.44 - 1.00 mg/dL  Calcium 9.9 8.9 - 10.3 mg/dL   GFR, Estimated >60 >60 mL/min   Anion gap 8 5 - 15  CBC  Result Value Ref Range   WBC 5.9 4.0 - 10.5 K/uL   RBC 4.43 3.87 - 5.11 MIL/uL   Hemoglobin 14.0 12.0 - 15.0 g/dL   HCT 39.8 36.0 - 46.0 %   MCV 89.8 80.0 - 100.0 fL   MCH 31.6 26.0 - 34.0 pg   MCHC 35.2 30.0 - 36.0 g/dL   RDW 12.4 11.5 - 15.5 %   Platelets 235 150 - 400 K/uL   nRBC 0.0 0.0 - 0.2 %    This visit occurred during the SARS-CoV-2 public health emergency.  Safety protocols were in place, including screening questions prior to the visit, additional usage of staff PPE, and extensive cleaning of exam room while observing appropriate contact time as indicated for disinfecting solutions.   COVID 19 screen:  No recent travel or known exposure to COVID19 The patient denies respiratory symptoms of COVID 19 at this time. The importance of social distancing was discussed today.   Assessment and Plan   The patient's preventative maintenance and recommended screening tests for an annual wellness exam were reviewed in full today. Brought up to date unless services declined.  Counselled on the importance of diet, exercise, and its role in overall health and mortality. The patient's FH and SH was reviewed, including their home life, tobacco status, and drug and alcohol status.   Vaccines: Uptodate , not interested in shingles vaccine. COVID vaccine x 5 Mammo: nml 04/2019 per pt, Plans q2 years. Mother breast cancer. Had one last week ( was normal) Colon: Dr Amedeo Plenty, 2016 nml, mother colon cancer, 07/2020 no polyps DEXA: worsened osteopenia 2019, q 2-5 year... Now on fosamx 2019, PAP/DVE :  PAP not indicated, no family hx of uterine ovarian cancer.  No concerns. No DVE  indicated. Seeing GYN.Marland Kitchen per pt report they did one 2022.  Non smoker   Medicare Wellness Visit with nurse, followed by  27 minute CPE with Dr. Diona Browner  Problem List Items Addressed This Visit     Essential hypertension    Stable, chronic.  Continue current medication.   Good control on lisinopril HCTZ,  and diltiazem      Hypercholesterolemia    Due for re-eval.       Relevant Orders   Lipid panel   Comprehensive metabolic panel   Osteopenia    ON fosamax per GYN.      Paroxysmal atrial fibrillation (HCC)     Rate controlled s/p ablation. Now off flecainide.  Only occ has had to use diltiazem prn.  on longterm eliquis.      Prediabetes   Relevant Orders   Hemoglobin A1c   Other Visit Diagnoses     Routine general medical examination at a health care facility    -  Primary       Eliezer Lofts, MD

## 2021-08-06 NOTE — Assessment & Plan Note (Signed)
ON fosamax per GYN.

## 2021-08-06 NOTE — Assessment & Plan Note (Addendum)
Stable, chronic.  Continue current medication.   Good control on lisinopril HCTZ,  and diltiazem 

## 2021-08-06 NOTE — Assessment & Plan Note (Addendum)
Rate controlled s/p ablation. Now off flecainide.  Only occ has had to use diltiazem prn.  on longterm eliquis. 

## 2021-08-06 NOTE — Patient Instructions (Addendum)
Please stop at the lab to have labs drawn.  Work on [employer] and regular exercise.

## 2021-08-06 NOTE — Assessment & Plan Note (Signed)
Due for re-eval. 

## 2021-09-17 ENCOUNTER — Other Ambulatory Visit (HOSPITAL_COMMUNITY): Payer: Self-pay | Admitting: *Deleted

## 2021-09-17 MED ORDER — DILTIAZEM HCL ER COATED BEADS 240 MG PO CP24: 240.0000 mg | ORAL_CAPSULE | Freq: Every day | ORAL | 3 refills | Status: DC

## 2021-09-27 DIAGNOSIS — Z0100 Encounter for examination of eyes and vision without abnormal findings: Secondary | ICD-10-CM | POA: Diagnosis not present

## 2021-09-27 DIAGNOSIS — H2513 Age-related nuclear cataract, bilateral: Secondary | ICD-10-CM | POA: Diagnosis not present

## 2021-12-13 ENCOUNTER — Other Ambulatory Visit: Payer: Self-pay | Admitting: Family Medicine

## 2021-12-30 ENCOUNTER — Ambulatory Visit (HOSPITAL_COMMUNITY)
Admission: RE | Admit: 2021-12-30 | Discharge: 2021-12-30 | Disposition: A | Discharge status: Home / Self Care | Payer: Medicare HMO | Source: Ambulatory Visit | Attending: Physician Assistant | Admitting: Physician Assistant

## 2021-12-30 VITALS — BP 150/70 | HR 67 | Ht 60.25 in | Wt 124.0 lb

## 2021-12-30 DIAGNOSIS — D6869 Other thrombophilia: Secondary | ICD-10-CM | POA: Insufficient documentation

## 2021-12-30 DIAGNOSIS — I10 Essential (primary) hypertension: Secondary | ICD-10-CM | POA: Insufficient documentation

## 2021-12-30 DIAGNOSIS — E785 Hyperlipidemia, unspecified: Secondary | ICD-10-CM | POA: Insufficient documentation

## 2021-12-30 DIAGNOSIS — Z7901 Long term (current) use of anticoagulants: Secondary | ICD-10-CM | POA: Insufficient documentation

## 2021-12-30 DIAGNOSIS — I480 Paroxysmal atrial fibrillation: Secondary | ICD-10-CM | POA: Diagnosis not present

## 2021-12-30 NOTE — Progress Notes (Signed)
? ? ?Primary Care Physician: Jinny Sanders, MD ?Primary Cardiologist: Dr Johnsie Cancel (remotely) ?Primary Electrophysiologist: Dr Rayann Heman ?Referring Physician: [facility] ? ? ?Alexandria Mcdowell is a 81 y.o. female with a history of paroxysmal atrial fibrillation, HTN, HLD who presents for follow up in the [facility]. She is s/p ablation with Dr Rayann Heman on 02/08/19. Flecainide was discontinued.  ? ?On follow up today, patient reports she has done very well since her last visit. She denies any interim symptomatic episodes of afib. No bleeding issues on anticoagulation.  ? ?Today, she denies symptoms of palpitations, chest pain, shortness of breath, orthopnea, PND, lower extremity edema, dizziness, presyncope, syncope, snoring, daytime somnolence, bleeding, or neurologic sequela. The patient is tolerating medications without difficulties and is otherwise without complaint today.  ? ? ?Atrial Fibrillation Risk Factors: ? ?she does not have symptoms or diagnosis of sleep apnea. ?she does not have a history of rheumatic fever. ?she does not have a history of alcohol use. ?The patient does not have a history of early familial atrial fibrillation or other arrhythmias. ? ?she has a BMI of Body mass index is 24.02 kg/m?Marland KitchenMarland Kitchen ?Filed Weights  ? 12/30/21 0952  ?Weight: 56.2 kg  ? ? ? ?Family History  ?Problem Relation Age of Onset  ? Hypertension Mother   ? Hyperlipidemia Mother   ? Cancer Mother   ?     breast/colon  ? Heart disease Mother   ? Diabetes Father   ? Emphysema Father   ? Hyperlipidemia Sister   ? Hypertension Sister   ? Hypothyroidism Sister   ? ? ? ?Atrial Fibrillation Management history: ? ?Previous antiarrhythmic drugs: flecainide ?Previous cardioversions: 12/29/18, 01/04/19 ?Previous ablations: 02/08/19 ?CHADS2VASC score: 12 (female, age, HTN) ?Anticoagulation history: Eliquis  ? ? ?Past Medical History:  ?Diagnosis Date  ? Allergy   ? Anemia   ? Atypical mole 10/20/2001  ? Left Mid to Upper Back-Slight  to Moderate(no treatment)  ? BCC (basal cell carcinoma of skin) 02/07/2004  ? Left Shin(excision) Dr. Tonia Brooms  ? Hyperlipidemia   ? Hypertension   ? Migraine   ? 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 Left Nose-treatment curet and excision  ? Nodular basal cell carcinoma (BCC) 10/18/2019  ? Right Cheek-treatment curet, cauter and excision  ? Paroxysmal atrial fibrillation (HCC)   ? 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 Left Hand([place]) Dr. Tonia Brooms  ? SCCA (squamous cell carcinoma) of skin 08/03/2000  ? Left Shin([place]) Dr. Tonia Brooms  ? SCCA (squamous cell carcinoma) of skin 08/03/2000  ? Right Shin([place]) Dr. Tonia Brooms  ? SCCA (squamous cell carcinoma) of skin 09/17/2000  ? Right Forearm([place]) Dr. Susa Simmonds  ? SCCA (squamous cell carcinoma) of skin 09/17/2000  ? Right Lower Leg([facility]) Dr. Susa Simmonds  ? SCCA (squamous cell carcinoma) of skin 09/17/2000  ? Left Upper Lower Leg,Lower Knee([facility]) Dr. Susa Simmonds  ? SCCA (squamous cell carcinoma) of skin 09/17/2000  ? Left Lower Medial Leg([place]) Dr. Susa Simmonds  ? SCCA (squamous cell carcinoma) of skin 10/13/2000  ? Left Medial Ankle([facility]) Dr. Susa Simmonds (excision)  ? SCCA (squamous cell carcinoma) of skin 10/13/2000  ? Right Lateral Ankle([facility]) Dr. Susa Simmonds (excision)  ? SCCA (squamous cell carcinoma) of skin 12/03/2000  ? Left Upper Arm([place]) Dr. Susa Simmonds  ? SCCA (squamous cell carcinoma) of skin 12/03/2000  ? Left Med. Lower Leg([facility]) Dr. Susa Simmonds (excision)  ? SCCA (squamous cell carcinoma) of skin 12/11/2000  ? Left Upper Thigh([place]) Dr. Susa Simmonds  ? SCCA (squamous cell  carcinoma) of skin 12/15/2000  ? Left Thigh([place]) Dr. Susa Simmonds  ? SCCA (squamous cell carcinoma) of skin 01/18/2001  ? Left Forearm(in Situ) Dr. Tonia Brooms  ? SCCA (squamous cell carcinoma) of skin 01/18/2001  ? Left Hand 2nd/3rd map(in situ) Dr.  Tonia Brooms)  ? SCCA (squamous cell carcinoma) of skin 01/18/2001  ? Left Shin Lat(in situ) Dr. Tonia Brooms  ? SCCA (squamous cell carcinoma) of skin 01/18/2001  ? Left Calf(in situ) Dr. Tonia Brooms  ? SCCA (squamous cell carcinoma) of skin 10/20/2001  ? Right Ear Lobe([place]) Dr. Tonia Brooms (exc)  ? SCCA (squamous cell carcinoma) of skin 02/02/2002  ? Left Lower Leg Post Sup(in situ) Dr. Tonia Brooms  ? SCCA (squamous cell carcinoma) of skin 02/07/2004  ? Left Shin Lateral(in situ) Dr. Tonia Brooms  ? SCCA (squamous cell carcinoma) of skin 04/03/2009  ? Left Post Shoulder(in situ) treatment curet and 5FU  ? SCCA (squamous cell carcinoma) of skin 04/03/2009  ? Left Outer Thigh(in situ) treatment curet and 5FU  ? SCCA (squamous cell carcinoma) of skin 04/03/2009  ? Right Lower Leg(in situ) treatment curet and 5FU  ? SCCA (squamous cell carcinoma) of skin 07/08/2016  ? Left Inner Knee(in situ) treatment after biopsy  ? SCCA (squamous cell carcinoma) of skin 10/18/2019  ? Right Upper Arm - CX3 + 5FU  ? Superficial basal cell carcinoma (BCC) 02/19/2000  ? Left Forearm (Dr. Tonia Brooms)  ? ?Past Surgical History:  ?Procedure Laterality Date  ? APPENDECTOMY    ? ATRIAL FIBRILLATION ABLATION N/A 02/08/2019  ? Procedure: ATRIAL FIBRILLATION ABLATION;  Surgeon: Thompson Grayer, MD;  Location: [facility];  Service: Cardiovascular;  Laterality: N/A;  ? BREAST BIOPSY    ? CARDIOVERSION N/A 12/29/2018  ? Procedure: CARDIOVERSION;  Surgeon: Dorothy Spark, MD;  Location: [facility];  Service: Cardiovascular;  Laterality: N/A;  ? CATARACT EXTRACTION  05/05/13  ? right eye   ? COLONOSCOPY  2006  ? ECTOPIC PREGNANCY SURGERY  1970  ? WISDOM TOOTH EXTRACTION    ? ? ?Current Outpatient Medications  ?Medication Sig Dispense Refill  ? alendronate (FOSAMAX) 35 MG tablet Take 35 mg by mouth every Tuesday. Take with a full glass of water on an empty stomach.    ? atorvastatin (LIPITOR) 10 MG tablet TAKE 1 TABLET EVERY DAY 90 tablet 2  ? Cholecalciferol (D3 VITAMIN  PO) Take 10 mcg by mouth 2 (two) times daily.    ? diltiazem (CARDIZEM CD) 240 MG 24 hr capsule Take 1 capsule (240 mg total) by mouth daily. 90 capsule 3  ? diltiazem (CARDIZEM) 30 MG tablet Take 1 tablet every 4 hours AS NEEDED for heart rate >100 90 tablet 1  ? ELIQUIS 2.5 MG TABS tablet TAKE 1 TABLET (2.5 MG TOTAL) BY MOUTH 2 (TWO) TIMES DAILY. 180 tablet 2  ? lisinopril-hydrochlorothiazide (ZESTORETIC) 10-12.5 MG tablet TAKE 1 TABLET EVERY DAY 90 tablet 0  ? Multiple Vitamins-Minerals (MULTIVITAMIN WITH MINERALS) tablet Take 1 tablet by mouth daily with supper.     ? Omega-3 Fatty Acids (FISH OIL) 1200 MG CAPS Take 1,200 mg by mouth daily with supper.     ? Polyethyl Glycol-Propyl Glycol 0.4-0.3 % SOLN Place 1 drop into both eyes 3 (three) times daily as needed (for dry/irritated eyes.).    ? sodium chloride (OCEAN) 0.65 % SOLN nasal spray Place 1 spray into both nostrils 4 (four) times daily as needed for congestion (allergies.).     ? ?No current facility-administered medications for this encounter.  ? ? ?  Allergies  ?Allergen Reactions  ? Clindamycin Hcl Nausea And Vomiting  ?  nervous  ? Codeine Nausea And Vomiting  ? Erythromycin Nausea And Vomiting  ? Tetracycline Nausea And Vomiting  ? Penicillins Rash  ?  Did it involve swelling of the face/tongue/throat, SOB, or low BP? No ?Did it involve sudden or severe rash/hives, skin peeling, or any reaction on the inside of your mouth or nose? Yes ?Did you need to seek medical attention at a hospital or doctor's office? Yes ?When did it last happen?     childhood  ?If all above answers are ?NO?, may proceed with cephalosporin use. ?  ? Sulfonamide Derivatives Nausea And Vomiting and Rash  ? ? ?Social History  ? ?Socioeconomic History  ? Marital status: Married  ?  Spouse name: Not on file  ? Number of children: Not on file  ? Years of education: Not on file  ? Highest education level: Not on file  ?Occupational History  ? Not on file  ?Tobacco Use  ? Smoking  status: Never  ? Smokeless tobacco: Never  ?Vaping Use  ? Vaping Use: Never used  ?Substance and Sexual Activity  ? Alcohol use: No  ? Drug use: No  ? Sexual activity: Yes  ?Other Topics Concern  ? Not on file

## 2022-04-11 NOTE — Progress Notes (Deleted)
PCP:  Jinny Sanders, MD Primary Cardiologist: Jenkins Rouge, MD Electrophysiologist: None   Alexandria Mcdowell is a 81 y.o. female seen today for None for routine electrophysiology followup.  Since last being seen in our clinic the patient reports doing ***.  she denies chest pain, palpitations, dyspnea, PND, orthopnea, nausea, vomiting, dizziness, syncope, edema, weight gain, or early satiety.  Past Medical History:  Diagnosis Date   Allergy    Anemia    Atypical mole 10/20/2001   Left Mid to Upper Back-Slight to Moderate(no treatment)   BCC (basal cell carcinoma of skin) 02/07/2004   Left Shin(excision) Dr. 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 Tonia Brooms   SCCA (squamous cell carcinoma) of skin 08/03/2000   Left Shin([place]) Dr. 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 Tonia Brooms   SCCA (squamous cell carcinoma) of skin 09/17/2000   Right Forearm([place]) Dr. Susa Simmonds   SCCA (squamous cell carcinoma) of skin 09/17/2000   Right Lower Leg([place]) Dr. Susa Simmonds   SCCA (squamous cell carcinoma) of skin 09/17/2000   Left Upper Lower Leg,Lower Knee([place]) Dr. Susa Simmonds   SCCA (squamous cell carcinoma) of skin 09/17/2000   Left Lower Medial Leg([place]) Dr. Susa Simmonds   SCCA (squamous cell carcinoma) of skin 10/13/2000   Left Medial Ankle([place]) Dr. Susa Simmonds (excision)   SCCA (squamous cell carcinoma) of skin 10/13/2000   Right Lateral Ankle([facility]) Dr. Susa Simmonds (excision)   SCCA (squamous cell carcinoma) of skin 12/03/2000   Left Upper Arm([place]) Dr.  Susa Simmonds   SCCA (squamous cell carcinoma) of skin 12/03/2000   Left Med. Lower Leg([facility]) Dr. Susa Simmonds (excision)   SCCA (squamous cell carcinoma) of skin 12/11/2000   Left Upper Thigh([place]) Dr. Susa Simmonds   SCCA (squamous cell carcinoma) of skin 12/15/2000   Left Thigh([place]) Dr. Susa Simmonds   SCCA (squamous cell carcinoma) of skin 01/18/2001   Left Forearm(in [place]u) Dr. Tonia Brooms   SCCA (squamous cell carcinoma) of skin 01/18/2001   Left Hand 2nd/3rd map(in situ) Dr. 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 Tonia Brooms   SCCA (squamous cell carcinoma) of skin 10/20/2001   Right Ear Lobe([place]) Dr. Tonia Brooms (exc)   SCCA (squamous cell carcinoma) of skin 02/02/2002   Left Lower Leg Post Sup(in situ) Dr. 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 Tonia Brooms   SCCA (squamous cell carcinoma) of skin 04/03/2009   Left Post Shoulder(in situ) treatment curet and 5FU   SCCA (squamous cell carcinoma) of skin 04/03/2009   Left Outer Thigh(in situ) treatment curet and 5FU   SCCA (squamous cell carcinoma) of skin 04/03/2009   Right Lower Leg(in situ) treatment curet and 5FU   SCCA (squamous cell carcinoma) of skin 07/08/2016   Left Inner Knee(in situ) treatment after biopsy   SCCA (squamous cell carcinoma) of skin 10/18/2019   Right Upper Arm - CX3 + 5FU   Superficial basal cell carcinoma (BCC) 02/19/2000   Left Forearm (Dr. Tonia Brooms)   Past  Surgical History:  Procedure Laterality Date   APPENDECTOMY     ATRIAL FIBRILLATION ABLATION N/A 02/08/2019   Procedure: ATRIAL FIBRILLATION ABLATION;  Surgeon: Thompson Grayer, MD;  Location: [facility];  Service: Cardiovascular;  Laterality: N/A;   BREAST BIOPSY     CARDIOVERSION N/A 12/29/2018   Procedure: CARDIOVERSION;  Surgeon:  Dorothy Spark, MD;  Location: [facility];  Service: Cardiovascular;  Laterality: N/A;   CATARACT EXTRACTION  05/05/13   right eye    COLONOSCOPY  2006   ECTOPIC PREGNANCY SURGERY  1970   WISDOM TOOTH EXTRACTION      Current Outpatient Medications  Medication Sig Dispense Refill   alendronate (FOSAMAX) 35 MG tablet Take 35 mg by mouth every Tuesday. Take with a full glass of water on an empty stomach.     atorvastatin (LIPITOR) 10 MG tablet TAKE 1 TABLET EVERY DAY 90 tablet 2   Cholecalciferol (D3 VITAMIN PO) Take 10 mcg by mouth 2 (two) times daily.     diltiazem (CARDIZEM CD) 240 MG 24 hr capsule Take 1 capsule (240 mg total) by mouth daily. 90 capsule 3   diltiazem (CARDIZEM) 30 MG tablet Take 1 tablet every 4 hours AS NEEDED for heart rate >100 90 tablet 1   ELIQUIS 2.5 MG TABS tablet TAKE 1 TABLET (2.5 MG TOTAL) BY MOUTH 2 (TWO) TIMES DAILY. 180 tablet 2   lisinopril-hydrochlorothiazide (ZESTORETIC) 10-12.5 MG tablet TAKE 1 TABLET EVERY DAY 90 tablet 0   Multiple Vitamins-Minerals (MULTIVITAMIN WITH MINERALS) tablet Take 1 tablet by mouth daily with supper.      Omega-3 Fatty Acids (FISH OIL) 1200 MG CAPS Take 1,200 mg by mouth daily with supper.      Polyethyl Glycol-Propyl Glycol 0.4-0.3 % SOLN Place 1 drop into both eyes 3 (three) times daily as needed (for dry/irritated eyes.).     sodium chloride (OCEAN) 0.65 % SOLN nasal spray Place 1 spray into both nostrils 4 (four) times daily as needed for congestion (allergies.).      No current facility-administered medications for this visit.    Allergies  Allergen Reactions   Clindamycin Hcl Nausea And Vomiting    nervous   Codeine Nausea And Vomiting   Erythromycin Nausea And Vomiting   Tetracycline Nausea And Vomiting   Penicillins Rash    Did it involve swelling of the face/tongue/throat, SOB, or low BP? No Did it involve sudden or severe rash/hives, skin peeling, or any reaction on the inside of your mouth or nose?  Yes Did you need to seek medical attention at a hospital or doctor's office? Yes When did it last happen?     childhood  If all above answers are "NO", may proceed with cephalosporin use.    Sulfonamide Derivatives Nausea And Vomiting and Rash    Social History   Socioeconomic History   Marital status: Married    Spouse name: Not on file   Number of children: Not on file   Years of education: Not on file   Highest education level: Not on file  Occupational History   Not on file  Tobacco Use   Smoking status: Never   Smokeless tobacco: Never  Vaping Use   Vaping Use: Never used  Substance and Sexual Activity   Alcohol use: No   Drug use: No   Sexual activity: Yes  Other Topics Concern   Not on file  Social History Narrative   HS[employer], [employer]-elementary ed   Married '64  3 sons-'66, '68, '71; 6 grandchildren   Work: taught school for 2 years , 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 Laymen's guide and forms provided.    Reviewed Aug '15      Exercsie:5 days a week, treadmill   Diet: healthy, weight watchers.         Attends [employer]   Social Determinants of Health   Financial Resource Strain: Low Risk  (07/29/2021)   Overall Financial Resource Strain (CARDIA)    Difficulty of Paying Living Expenses: Not hard at all  Food Insecurity: No Food Insecurity (07/29/2021)   Hunger Vital Sign    Worried About Running Out of Food in the Last Year: Never true    Ran Out of Food in the Last Year: Never true  Transportation Needs: No Transportation Needs (07/29/2021)   PRAPARE - Hydrologist (Medical): No    Lack of Transportation (Non-Medical): No  Physical Activity: Inactive (07/29/2021)   Exercise Vital Sign    Days of Exercise per Week: 0 days    Minutes of Exercise per Session: 0 min  Stress: No Stress Concern Present (07/29/2021)    [place]    Feeling of Stress : Not at all  Social Connections: Moderately Integrated (07/29/2021)   Social Connection and Isolation Panel [NHANES]    Frequency of Communication with Friends and Family: More than three times a week    Frequency of Social Gatherings with Friends and Family: Three times a week    Attends Religious Services: More than 4 times per year    Active Member of Clubs or Organizations: No    Attends Archivist Meetings: Never    Marital Status: Married  Human resources officer Violence: Not At Risk (07/29/2021)   Humiliation, Afraid, Rape, and Kick questionnaire    Fear of Current or Ex-Partner: No    Emotionally Abused: No    Physically Abused: No    Sexually Abused: No     Review of Systems: All other systems reviewed and are otherwise negative except as noted above.  Physical Exam: There were no vitals filed for this visit.  GEN- The patient is well appearing, alert and oriented x 3 today.   HEENT: normocephalic, atraumatic; sclera clear, conjunctiva pink; hearing intact; oropharynx clear; neck supple, no JVP Lymph- no cervical lymphadenopathy Lungs- Clear to ausculation bilaterally, normal work of breathing.  No wheezes, rales, rhonchi Heart- Regular rate and rhythm, no murmurs, rubs or gallops, PMI not laterally displaced GI- soft, non-tender, non-distended, bowel sounds present, no hepatosplenomegaly Extremities- no clubbing, cyanosis, or edema; DP/PT/radial pulses 2+ bilaterally MS- no significant deformity or atrophy Skin- warm and dry, no rash or lesion Psych- euthymic mood, full affect Neuro- strength and sensation are intact  EKG {ACTION; IS/IS FAO:[id number]} ordered. Personal review of EKG from {Blank single:19197::"today","***"} shows ***  Additional studies reviewed include: Previous EP office notes. ***  Assessment and Plan:  1. Paroxysmal Atrial Fibrillation s/p  ablation 02/08/2019 Continue Eliquis for CHA2DS2VASC of at least 4 Continue Diltiazem     2. HTN Stable on current regimen   Follow up with {Blank single:19197::"Dr. Allred","Dr. Arlan Organ. Klein","Dr. Camnitz","Dr. Lambert","EP APP"} in {Blank single:19197::"2 weeks","4 weeks","3 months","6 months","12 months","as usual post gen change"}   Shirley Friar, PA-C  04/11/22 3:20 PM

## 2022-04-16 ENCOUNTER — Ambulatory Visit: Payer: Medicare HMO | Admitting: Student

## 2022-04-16 DIAGNOSIS — I10 Essential (primary) hypertension: Secondary | ICD-10-CM

## 2022-04-16 DIAGNOSIS — D6869 Other thrombophilia: Secondary | ICD-10-CM

## 2022-04-16 DIAGNOSIS — I480 Paroxysmal atrial fibrillation: Secondary | ICD-10-CM

## 2022-04-23 NOTE — Progress Notes (Signed)
PCP:  Jinny Sanders, MD Primary Cardiologist: Jenkins Rouge, MD Electrophysiologist: Thompson Grayer, MD   Alexandria Mcdowell is a 81 y.o. female seen today for Thompson Grayer, MD for routine electrophysiology followup.  Since last being seen in our clinic the patient reports doing well overall.  she denies chest pain, palpitations, dyspnea, PND, orthopnea, nausea, vomiting, dizziness, syncope, edema, weight gain, or early satiety.  Past Medical History:  Diagnosis Date   Allergy    Anemia    Atypical mole 10/20/2001   Left Mid to Upper Back-Slight to Moderate(no treatment)   BCC (basal cell carcinoma of skin) 02/07/2004   Left Shin(excision) Dr. 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 Tonia Brooms   SCCA (squamous cell carcinoma) of skin 08/03/2000   Left Shin([place]) Dr. 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 Tonia Brooms   SCCA (squamous cell carcinoma) of skin 09/17/2000   Right Forearm([place]) Dr. Susa Simmonds   SCCA (squamous cell carcinoma) of skin 09/17/2000   Right Lower Leg([place]) Dr. Susa Simmonds   SCCA (squamous cell carcinoma) of skin 09/17/2000   Left Upper Lower Leg,Lower Knee([place]) Dr. Susa Simmonds   SCCA (squamous cell carcinoma) of skin 09/17/2000   Left Lower Medial Leg([place]) Dr. Susa Simmonds   SCCA (squamous cell carcinoma) of skin 10/13/2000   Left Medial Ankle([place]) Dr. Susa Simmonds (excision)   SCCA (squamous cell carcinoma) of skin 10/13/2000   Right Lateral Ankle([place]) Dr. Susa Simmonds (excision)   SCCA (squamous cell carcinoma) of skin 12/03/2000    Left Upper Arm([place]) Dr. Susa Simmonds   SCCA (squamous cell carcinoma) of skin 12/03/2000   Left Med. Lower Leg([place]) Dr. Susa Simmonds (excision)   SCCA (squamous cell carcinoma) of skin 12/11/2000   Left Upper Thigh([place]) Dr. Susa Simmonds   SCCA (squamous cell carcinoma) of skin 12/15/2000   Left Thigh([place]) Dr. Susa Simmonds   SCCA (squamous cell carcinoma) of skin 01/18/2001   Left Forearm([place]) Dr. Tonia Brooms   SCCA (squamous cell carcinoma) of skin 01/18/2001   Left Hand 2nd/3rd map(in situ) Dr. 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 Tonia Brooms   SCCA (squamous cell carcinoma) of skin 10/20/2001   Right Ear Lobe([place]) Dr. Tonia Brooms (exc)   SCCA (squamous cell carcinoma) of skin 02/02/2002   Left Lower Leg Post Sup(in situ) Dr. 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 Tonia Brooms   SCCA (squamous cell carcinoma) of skin 04/03/2009   Left Post Shoulder(in situ) treatment curet and 5FU   SCCA (squamous cell carcinoma) of skin 04/03/2009   Left Outer Thigh(in situ) treatment curet and 5FU   SCCA (squamous cell carcinoma) of skin 04/03/2009   Right Lower Leg(in situ) treatment curet and 5FU   SCCA (squamous cell carcinoma) of skin 07/08/2016   Left Inner Knee(in situ) treatment after biopsy   SCCA (squamous cell carcinoma) of skin 10/18/2019   Right Upper Arm - CX3 + 5FU   Superficial basal cell carcinoma (BCC) 02/19/2000   Left Forearm (  Dr. Tonia Brooms)   Past Surgical History:  Procedure Laterality Date   APPENDECTOMY     ATRIAL FIBRILLATION ABLATION N/A 02/08/2019   Procedure: ATRIAL FIBRILLATION ABLATION;  Surgeon: Thompson Grayer, MD;  Location: [place] [facility];  Service: Cardiovascular;  Laterality: N/A;   BREAST BIOPSY     CARDIOVERSION N/A 12/29/2018    Procedure: CARDIOVERSION;  Surgeon: Dorothy Spark, MD;  Location: [facility];  Service: Cardiovascular;  Laterality: N/A;   CATARACT EXTRACTION  05/05/13   right eye    COLONOSCOPY  2006   ECTOPIC PREGNANCY SURGERY  1970   WISDOM TOOTH EXTRACTION      Current Outpatient Medications  Medication Sig Dispense Refill   alendronate (FOSAMAX) 35 MG tablet Take 35 mg by mouth every Tuesday. Take with a full glass of water on an empty stomach.     atorvastatin (LIPITOR) 10 MG tablet TAKE 1 TABLET EVERY DAY 90 tablet 2   Cholecalciferol (D3 VITAMIN PO) Take 10 mcg by mouth 2 (two) times daily.     diltiazem (CARDIZEM CD) 240 MG 24 hr capsule Take 1 capsule (240 mg total) by mouth daily. 90 capsule 3   diltiazem (CARDIZEM) 30 MG tablet Take 1 tablet every 4 hours AS NEEDED for heart rate >100 90 tablet 1   ELIQUIS 2.5 MG TABS tablet TAKE 1 TABLET (2.5 MG TOTAL) BY MOUTH 2 (TWO) TIMES DAILY. 180 tablet 2   lisinopril-hydrochlorothiazide (ZESTORETIC) 10-12.5 MG tablet TAKE 1 TABLET EVERY DAY 90 tablet 0   Multiple Vitamins-Minerals (MULTIVITAMIN WITH MINERALS) tablet Take 1 tablet by mouth daily with supper.      Omega-3 Fatty Acids (FISH OIL) 1200 MG CAPS Take 1,200 mg by mouth daily with supper.      Polyethyl Glycol-Propyl Glycol 0.4-0.3 % SOLN Place 1 drop into both eyes 3 (three) times daily as needed (for dry/irritated eyes.).     sodium chloride (OCEAN) 0.65 % SOLN nasal spray Place 1 spray into both nostrils 4 (four) times daily as needed for congestion (allergies.).      No current facility-administered medications for this visit.    Allergies  Allergen Reactions   Clindamycin Hcl Nausea And Vomiting    nervous   Codeine Nausea And Vomiting   Erythromycin Nausea And Vomiting   Tetracycline Nausea And Vomiting   Penicillins Rash    Did it involve swelling of the face/tongue/throat, SOB, or low BP? No Did it involve sudden or severe rash/hives, skin peeling, or any reaction on the  inside of your mouth or nose? Yes Did you need to seek medical attention at a hospital or doctor's office? Yes When did it last happen?     childhood  If all above answers are "NO", may proceed with cephalosporin use.    Sulfonamide Derivatives Nausea And Vomiting and Rash    Social History   Socioeconomic History   Marital status: Married    Spouse name: Not on file   Number of children: Not on file   Years of education: Not on file   Highest education level: Not on file  Occupational History   Not on file  Tobacco Use   Smoking status: Never   Smokeless tobacco: Never  Vaping Use   Vaping Use: Never used  Substance and Sexual Activity   Alcohol use: No   Drug use: No   Sexual activity: Yes  Other Topics Concern   Not on file  Social History Narrative   HSG, [employer]-elementary ed  Married '64   3 sons-'66, '68, '71; 6 grandchildren   Work: taught school for 2 years , 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 Laymen's guide and forms provided.    Reviewed Aug '15      Exercsie:5 days a week, treadmill   Diet: healthy, weight watchers.         Attends [employer]   Social Determinants of Health   Financial Resource Strain: Low Risk  (07/29/2021)   Overall Financial Resource Strain (CARDIA)    Difficulty of Paying Living Expenses: Not hard at all  Food Insecurity: No Food Insecurity (07/29/2021)   Hunger Vital Sign    Worried About Running Out of Food in the Last Year: Never true    Ran Out of Food in the Last Year: Never true  Transportation Needs: No Transportation Needs (07/29/2021)   PRAPARE - Hydrologist (Medical): No    Lack of Transportation (Non-Medical): No  Physical Activity: Inactive (07/29/2021)   Exercise Vital Sign    Days of Exercise per Week: 0 days    Minutes of Exercise per Session: 0 min  Stress: No Stress  Concern Present (07/29/2021)   [place]    Feeling of Stress : Not at all  Social Connections: Moderately Integrated (07/29/2021)   Social Connection and Isolation Panel [NHANES]    Frequency of Communication with Friends and Family: More than three times a week    Frequency of Social Gatherings with Friends and Family: Three times a week    Attends Religious Services: More than 4 times per year    Active Member of Clubs or Organizations: No    Attends Archivist Meetings: Never    Marital Status: Married  Human resources officer Violence: Not At Risk (07/29/2021)   Humiliation, Afraid, Rape, and Kick questionnaire    Fear of Current or Ex-Partner: No    Emotionally Abused: No    Physically Abused: No    Sexually Abused: No     Review of Systems: All other systems reviewed and are otherwise negative except as noted above.  Physical Exam: Vitals:   04/30/22 1034  BP: (!) 102/52  Pulse: 82  SpO2: 97%  Weight: 123 lb 9.6 oz (56.1 kg)  Height: 5' (1.524 m)    GEN- The patient is well appearing, alert and oriented x 3 today.   HEENT: normocephalic, atraumatic; sclera clear, conjunctiva pink; hearing intact; oropharynx clear; neck supple, no JVP Lymph- no cervical lymphadenopathy Lungs- Clear to ausculation bilaterally, normal work of breathing.  No wheezes, rales, rhonchi Heart- Regular rate and rhythm, no murmurs, rubs or gallops, PMI not laterally displaced GI- soft, non-tender, non-distended, bowel sounds present, no hepatosplenomegaly Extremities- no clubbing, cyanosis, or edema; DP/PT/radial pulses 2+ bilaterally MS- no significant deformity or atrophy Skin- warm and dry, no rash or lesion Psych- euthymic mood, full affect Neuro- strength and sensation are intact  EKG is not ordered. Personal review of EKG from  12/30/2021  shows NSR at 67 with a PAC  Additional studies reviewed include: Previous EP  and AF clinic notes.   Assessment and Plan:  1. Paroxysmal Atrial Fibrillation  S/p Ablation 02/08/2019 without recurrence Continue Eliquis for CHA2DS2VASC of at least 4 Continue Diltiazem   240 mg daily Labs today.   2. HTN Stable on current regimen  Follow up with Dr. Quentin Ore in 12 months to establish from Dr. Rayann Heman.    Shirley Friar, PA-C  04/30/22 10:42 AM

## 2022-04-30 ENCOUNTER — Ambulatory Visit: Payer: Medicare HMO | Admitting: Student

## 2022-04-30 ENCOUNTER — Encounter: Payer: Self-pay | Admitting: Student

## 2022-04-30 VITALS — BP 102/52 | HR 82 | Ht 60.0 in | Wt 123.6 lb

## 2022-04-30 DIAGNOSIS — I480 Paroxysmal atrial fibrillation: Secondary | ICD-10-CM | POA: Diagnosis not present

## 2022-04-30 DIAGNOSIS — I10 Essential (primary) hypertension: Secondary | ICD-10-CM

## 2022-04-30 NOTE — Patient Instructions (Signed)
Medication Instructions:  Your physician recommends that you continue on your current medications as directed. Please refer to the Current Medication list given to you today.  *If you need a refill on your cardiac medications before your next appointment, please call your pharmacy*   Lab Work: TODAY: BMET, CBC  If you have labs (blood work) drawn today and your tests are completely normal, you will receive your results only by: [place] (if you have MyChart) OR A paper copy in the mail If you have any lab test that is abnormal or we need to change your treatment, we will call you to review the results.   Follow-Up: At [facility], you and your health needs are our priority.  As part of our continuing mission to provide you with exceptional heart care, we have created designated Provider Care Teams.  These Care Teams include your primary Cardiologist (physician) and Advanced Practice Providers (APPs -  Physician Assistants and Nurse Practitioners) who all work together to provide you with the care you need, when you need it.  Your next appointment:   1 year(s)  The format for your next appointment:   In Person  Provider:   Lars Mage, MD

## 2022-05-01 LAB — CBC
Hematocrit: 39.3 % (ref 34.0–46.6)
Hemoglobin: 14.1 g/dL (ref 11.1–15.9)
MCH: 32.3 pg (ref 26.6–33.0)
MCHC: 35.9 g/dL — ABNORMAL HIGH (ref 31.5–35.7)
MCV: 90 fL (ref 79–97)
Platelets: 257 10*3/uL (ref 150–450)
RBC: 4.37 x10E6/uL (ref 3.77–5.28)
RDW: 13.1 % (ref 11.7–15.4)
WBC: 6.7 10*3/uL (ref 3.4–10.8)

## 2022-05-01 LAB — BASIC METABOLIC PANEL
BUN/Creatinine Ratio: 19 (ref 12–28)
BUN: 15 mg/dL (ref 8–27)
CO2: 26 mmol/L (ref 20–29)
Calcium: 9.8 mg/dL (ref 8.7–10.3)
Chloride: 100 mmol/L (ref 96–106)
Creatinine, Ser: 0.81 mg/dL (ref 0.57–1.00)
Glucose: 107 mg/dL — ABNORMAL HIGH (ref 70–99)
Potassium: 3.9 mmol/L (ref 3.5–5.2)
Sodium: 139 mmol/L (ref 134–144)
eGFR: 73 mL/min/{1.73_m2} (ref >59)

## 2022-06-19 ENCOUNTER — Other Ambulatory Visit: Payer: Self-pay | Admitting: Family Medicine

## 2022-06-23 ENCOUNTER — Other Ambulatory Visit (HOSPITAL_COMMUNITY): Payer: Self-pay | Admitting: Physician Assistant

## 2022-06-23 DIAGNOSIS — I480 Paroxysmal atrial fibrillation: Secondary | ICD-10-CM

## 2022-06-23 NOTE — Telephone Encounter (Signed)
Prescription refill request for Eliquis received. Indication:Afib  Last office visit:04/30/22 ([place])  Scr: 0.81 (04/30/22) Age: 81 Weight: 56.1kg  Appropriate dose and refill sent to requested pharmacy.

## 2022-07-21 ENCOUNTER — Other Ambulatory Visit (HOSPITAL_COMMUNITY): Payer: Self-pay | Admitting: Physician Assistant

## 2022-09-26 ENCOUNTER — Telehealth: Payer: Self-pay | Admitting: Family Medicine

## 2022-09-26 DIAGNOSIS — R7303 Prediabetes: Secondary | ICD-10-CM

## 2022-09-26 DIAGNOSIS — E7800 Pure hypercholesterolemia, unspecified: Secondary | ICD-10-CM

## 2022-09-26 NOTE — Telephone Encounter (Signed)
-----  Message from Velna Hatchet, RT sent at 09/25/2022  9:50 AM EST ----- Regarding: Wed 1/17 lab Patient is scheduled for cpx, please order future labs.  Thanks, Anda Kraft

## 2022-10-01 ENCOUNTER — Other Ambulatory Visit (INDEPENDENT_AMBULATORY_CARE_PROVIDER_SITE_OTHER): Payer: Medicare HMO

## 2022-10-01 DIAGNOSIS — E7800 Pure hypercholesterolemia, unspecified: Secondary | ICD-10-CM

## 2022-10-01 DIAGNOSIS — R7303 Prediabetes: Secondary | ICD-10-CM

## 2022-10-01 LAB — COMPREHENSIVE METABOLIC PANEL
ALT: 23 U/L (ref 0–35)
AST: 26 U/L (ref 0–37)
Albumin: 4.5 g/dL (ref 3.5–5.2)
Alkaline Phosphatase: 91 U/L (ref 39–117)
BUN: 18 mg/dL (ref 6–23)
CO2: 31 mEq/L (ref 19–32)
Calcium: 10.8 mg/dL — ABNORMAL HIGH (ref 8.4–10.5)
Chloride: 99 mEq/L (ref 96–112)
Creatinine, Ser: 0.86 mg/dL (ref 0.40–1.20)
GFR: 63.05 mL/min (ref >60.00)
Glucose, Bld: 107 mg/dL — ABNORMAL HIGH (ref 70–99)
Potassium: 4 mEq/L (ref 3.5–5.1)
Sodium: 138 mEq/L (ref 135–145)
Total Bilirubin: 0.7 mg/dL (ref 0.2–1.2)
Total Protein: 7.8 g/dL (ref 6.0–8.3)

## 2022-10-01 LAB — LIPID PANEL
Cholesterol: 162 mg/dL (ref 0–200)
HDL: 69.3 mg/dL (ref >39.00)
LDL Cholesterol: 67 mg/dL (ref 0–99)
NonHDL: 92.77
Total CHOL/HDL Ratio: 2
Triglycerides: 127 mg/dL (ref 0.0–149.0)
VLDL: 25.4 mg/dL (ref 0.0–40.0)

## 2022-10-01 LAB — HEMOGLOBIN A1C: Hgb A1c MFr Bld: 5.6 % (ref 4.6–6.5)

## 2022-10-02 NOTE — Progress Notes (Signed)
No critical labs need to be addressed urgently. We will discuss labs in detail at upcoming office visit.   

## 2022-10-07 ENCOUNTER — Other Ambulatory Visit (HOSPITAL_COMMUNITY): Payer: Self-pay | Admitting: *Deleted

## 2022-10-07 MED ORDER — DILTIAZEM HCL 30 MG PO TABS: ORAL_TABLET | ORAL | 1 refills | Status: DC

## 2022-10-08 ENCOUNTER — Encounter: Payer: Self-pay | Admitting: Family Medicine

## 2022-10-08 ENCOUNTER — Ambulatory Visit (INDEPENDENT_AMBULATORY_CARE_PROVIDER_SITE_OTHER): Payer: Medicare HMO | Admitting: Family Medicine

## 2022-10-08 VITALS — BP 118/64 | HR 82 | Temp 98.2°F | Resp 16 | Ht 60.25 in | Wt 120.2 lb

## 2022-10-08 DIAGNOSIS — E7800 Pure hypercholesterolemia, unspecified: Secondary | ICD-10-CM | POA: Diagnosis not present

## 2022-10-08 DIAGNOSIS — I10 Essential (primary) hypertension: Secondary | ICD-10-CM

## 2022-10-08 DIAGNOSIS — Z0000 Encounter for general adult medical examination without abnormal findings: Secondary | ICD-10-CM | POA: Diagnosis not present

## 2022-10-08 DIAGNOSIS — I480 Paroxysmal atrial fibrillation: Secondary | ICD-10-CM | POA: Diagnosis not present

## 2022-10-08 DIAGNOSIS — R7303 Prediabetes: Secondary | ICD-10-CM

## 2022-10-08 NOTE — Assessment & Plan Note (Signed)
Stable, chronic.  Continue current medication.   Good control on lisinopril HCTZ,  and diltiazem

## 2022-10-08 NOTE — Assessment & Plan Note (Signed)
Stable, chronic.  Continue current medication.  At goal on atorvastatin 10 mg daily

## 2022-10-08 NOTE — Assessment & Plan Note (Signed)
Rate controlled s/p ablation. Now off flecainide.  Only occ has had to use diltiazem prn.  on longterm eliquis.

## 2022-10-08 NOTE — Patient Instructions (Addendum)
Stop calcium... return in 2 weeks for recheck of calcium.  Vit D 1000 IU daily  Consider shingrix and RSV vaccine.

## 2022-10-08 NOTE — Progress Notes (Signed)
Patient ID: Alexandria Mcdowell, female    DOB: 14-Sep-1941, 82 y.o.   MRN: [id number]  This visit was conducted in person.  BP 118/64   Pulse 82   Temp 98.2 F (36.8 C)   Resp 16   Ht 5' 0.25" (1.53 m)   Wt 120 lb 4 oz (54.5 kg)   SpO2 97%   BMI 23.29 kg/m    CC: Chief Complaint  Patient presents with   Annual Exam    Subjective:   HPI: Alexandria Mcdowell is a 82 y.o. female presenting on 10/08/2022 for Annual Exam  The patient presents for complete physical and review of chronic health problems. He/She also has the following acute concerns today: none  She will schedule AMW with phone nurse.  Hypertension:   Good control on lisinopril HCTZ,  and diltiazem BP Readings from Last 3 Encounters:  10/08/22 118/64  04/30/22 (!) 102/52  12/30/21 (!) 150/70  Using medication without problems or lightheadedness:  none Chest pain with exertion:none Edema:none Short of breath: none Average home BPs: Other issues:     Reviewed last OV note from  Mr. Fento PA, Dr. Rayann Heman ( cardiology) for paroxsysaml afib. S/p ablation 02/08/19  No longer on flecanide Continue Eliquis 5 mg BID Continue diltiazem 240 mg daily with 30 mg PRN q 4 hours for heart racing.  Elevated Cholesterol:  At goal on atorvastatin 10 mg daily Lab Results  Component Value Date   CHOL 162 10/01/2022   HDL 69.30 10/01/2022   LDLCALC 67 10/01/2022   LDLDIRECT 65.0 06/23/2018   TRIG 127.0 10/01/2022   CHOLHDL 2 10/01/2022  Using medications without problems: Muscle aches:  Diet compliance: heart healthy diet Exercise:Walking,  [facility]. Other complaints:   Prediabetes  Lab Results  Component Value Date   HGBA1C 5.6 10/01/2022         Relevant past medical, surgical, family and social history reviewed and updated as indicated. Interim medical history since our last visit reviewed. Allergies and medications reviewed and updated. Outpatient Medications Prior to Visit  Medication Sig Dispense  Refill   alendronate (FOSAMAX) 35 MG tablet Take 35 mg by mouth every Tuesday. Take with a full glass of water on an empty stomach.     apixaban (ELIQUIS) 2.5 MG TABS tablet TAKE 1 TABLET TWICE DAILY 180 tablet 3   Cholecalciferol (D3 VITAMIN PO) Take 10 mcg by mouth 2 (two) times daily.     diltiazem (CARDIZEM CD) 240 MG 24 hr capsule TAKE 1 CAPSULE EVERY DAY 90 capsule 3   lisinopril-hydrochlorothiazide (ZESTORETIC) 10-12.5 MG tablet TAKE 1 TABLET EVERY DAY 90 tablet 0   Multiple Vitamins-Minerals (MULTIVITAMIN WITH MINERALS) tablet Take 1 tablet by mouth daily with supper.      Omega-3 Fatty Acids (FISH OIL) 1200 MG CAPS Take 1,200 mg by mouth daily with supper.      Polyethyl Glycol-Propyl Glycol 0.4-0.3 % SOLN Place 1 drop into both eyes 3 (three) times daily as needed (for dry/irritated eyes.).     sodium chloride (OCEAN) 0.65 % SOLN nasal spray Place 1 spray into both nostrils 4 (four) times daily as needed for congestion (allergies.).      atorvastatin (LIPITOR) 10 MG tablet TAKE 1 TABLET EVERY DAY 90 tablet 2   diltiazem (CARDIZEM) 30 MG tablet Take 1 tablet every 4 hours AS NEEDED for heart rate >100 90 tablet 1   No facility-administered medications prior to visit.     Per HPI unless  specifically indicated in ROS section below Review of Systems  Constitutional:  Negative for fatigue and fever.  HENT:  Negative for congestion.   Eyes:  Negative for pain.  Respiratory:  Negative for cough and shortness of breath.   Cardiovascular:  Negative for chest pain, palpitations and leg swelling.  Gastrointestinal:  Negative for abdominal pain.  Genitourinary:  Negative for dysuria and vaginal bleeding.  Musculoskeletal:  Negative for back pain.  Neurological:  Negative for syncope, light-headedness and headaches.  Psychiatric/Behavioral:  Negative for dysphoric mood.    Objective:  BP 118/64   Pulse 82   Temp 98.2 F (36.8 C)   Resp 16   Ht 5' 0.25" (1.53 m)   Wt 120 lb 4 oz (54.5  kg)   SpO2 97%   BMI 23.29 kg/m   Wt Readings from Last 3 Encounters:  11/12/22 120 lb (54.4 kg)  10/08/22 120 lb 4 oz (54.5 kg)  04/30/22 123 lb 9.6 oz (56.1 kg)      Physical Exam Vitals and nursing note reviewed.  Constitutional:      General: She is not in acute distress.    Appearance: Normal appearance. She is well-developed. She is not ill-appearing or toxic-appearing.  HENT:     Head: Normocephalic.     Right Ear: Hearing, tympanic membrane, ear canal and external ear normal.     Left Ear: Hearing, tympanic membrane, ear canal and external ear normal.     Nose: Nose normal.  Eyes:     General: Lids are normal. Lids are everted, no foreign bodies appreciated.     Conjunctiva/sclera: Conjunctivae normal.     Pupils: Pupils are equal, round, and reactive to light.  Neck:     Thyroid: No thyroid mass or thyromegaly.     Vascular: No carotid bruit.     Trachea: Trachea normal.  Cardiovascular:     Rate and Rhythm: Normal rate and regular rhythm.     Heart sounds: Normal heart sounds, S1 normal and S2 normal. No murmur heard.    No gallop.  Pulmonary:     Effort: Pulmonary effort is normal. No respiratory distress.     Breath sounds: Normal breath sounds. No wheezing, rhonchi or rales.  Abdominal:     General: Bowel sounds are normal. There is no distension or abdominal bruit.     Palpations: Abdomen is soft. There is no fluid wave or mass.     Tenderness: There is no abdominal tenderness. There is no guarding or rebound.     Hernia: No hernia is present.  Musculoskeletal:     Cervical back: Normal range of motion and neck supple.  Lymphadenopathy:     Cervical: No cervical adenopathy.  Skin:    General: Skin is warm and dry.     Findings: No rash.  Neurological:     Mental Status: She is alert.     Cranial Nerves: No cranial nerve deficit.     Sensory: No sensory deficit.  Psychiatric:        Mood and Affect: Mood is not anxious or depressed.        Speech:  Speech normal.        Behavior: Behavior normal. Behavior is cooperative.        Judgment: Judgment normal.       Results for orders placed or performed in visit on 10/01/22  Comprehensive metabolic panel  Result Value Ref Range   Sodium 138 135 - 145 mEq/L  Potassium 4.0 3.5 - 5.1 mEq/L   Chloride 99 96 - 112 mEq/L   CO2 31 19 - 32 mEq/L   Glucose, Bld 107 (H) 70 - 99 mg/dL   BUN 18 6 - 23 mg/dL   Creatinine, Ser 0.86 0.40 - 1.20 mg/dL   Total Bilirubin 0.7 0.2 - 1.2 mg/dL   Alkaline Phosphatase 91 39 - 117 U/L   AST 26 0 - 37 U/L   ALT 23 0 - 35 U/L   Total Protein 7.8 6.0 - 8.3 g/dL   Albumin 4.5 3.5 - 5.2 g/dL   GFR 63.05 >60.00 mL/min   Calcium 10.8 (H) 8.4 - 10.5 mg/dL  Lipid panel  Result Value Ref Range   Cholesterol 162 0 - 200 mg/dL   Triglycerides 127.0 0.0 - 149.0 mg/dL   HDL 69.30 >39.00 mg/dL   VLDL 25.4 0.0 - 40.0 mg/dL   LDL Cholesterol 67 0 - 99 mg/dL   Total CHOL/HDL Ratio 2    NonHDL 92.77   Hemoglobin A1c  Result Value Ref Range   Hgb A1c MFr Bld 5.6 4.6 - 6.5 %    This visit occurred during the SARS-CoV-2 public health emergency.  Safety protocols were in place, including screening questions prior to the visit, additional usage of staff PPE, and extensive cleaning of exam room while observing appropriate contact time as indicated for disinfecting solutions.   COVID 19 screen:  No recent travel or known exposure to COVID19 The patient denies respiratory symptoms of COVID 19 at this time. The importance of social distancing was discussed today.   Assessment and Plan   The patient's preventative maintenance and recommended screening tests for an annual wellness exam were reviewed in full today. Brought up to date unless services declined.  Counselled on the importance of diet, exercise, and its role in overall health and mortality. The patient's FH and SH was reviewed, including their home life, tobacco status, and drug and alcohol status.    Vaccines: Uptodate, not interested in shingles vaccine. COVID vaccine x 6, per pt she has had flu vaccine Mammo: nml 07/2021 per pt, Plans q2 years. Mother breast cancer.  Scheduled Colon: Dr Amedeo Plenty, 2016 nml, mother colon cancer, 07/2020 no polyps DEXA: worsened osteopenia 2019, q 2-5 year... Now on fosamx 2019,.. Scheduled.  PAP/DVE :  PAP not indicated, no family hx of uterine ovarian cancer.  No concerns. No DVE indicated. Seeing GYN Dr, Alfred Levins.. per pt report they did one 2022.  Non smoker     Problem List Items Addressed This Visit     Essential hypertension    Stable, chronic.  Continue current medication.   Good control on lisinopril HCTZ,  and diltiazem      Hypercalcemia   Relevant Orders   Calcium (Completed)   Hypercholesterolemia    Stable, chronic.  Continue current medication.  At goal on atorvastatin 10 mg daily       Paroxysmal atrial fibrillation (HCC)     Rate controlled s/p ablation. Now off flecainide.  Only occ has had to use diltiazem prn.  on longterm eliquis.      Prediabetes    Chronic, diet controlled.      Other Visit Diagnoses     Routine general medical examination at a health care facility    -  Primary      Eliezer Lofts, MD

## 2022-10-08 NOTE — Assessment & Plan Note (Signed)
Chronic, diet controlled.

## 2022-10-13 ENCOUNTER — Other Ambulatory Visit (HOSPITAL_COMMUNITY): Payer: Self-pay | Admitting: *Deleted

## 2022-10-13 ENCOUNTER — Encounter: Payer: Self-pay | Admitting: Family Medicine

## 2022-10-13 MED ORDER — DILTIAZEM HCL 30 MG PO TABS: ORAL_TABLET | ORAL | 1 refills | Status: DC

## 2022-10-13 NOTE — Telephone Encounter (Signed)
error 

## 2022-10-20 ENCOUNTER — Other Ambulatory Visit: Payer: Self-pay | Admitting: Family Medicine

## 2022-10-21 DIAGNOSIS — Z1151 Encounter for screening for human papillomavirus (HPV): Secondary | ICD-10-CM | POA: Diagnosis not present

## 2022-10-21 DIAGNOSIS — M8588 Other specified disorders of bone density and structure, other site: Secondary | ICD-10-CM | POA: Diagnosis not present

## 2022-10-21 DIAGNOSIS — Z7983 Long term (current) use of bisphosphonates: Secondary | ICD-10-CM | POA: Diagnosis not present

## 2022-10-21 DIAGNOSIS — Z124 Encounter for screening for malignant neoplasm of cervix: Secondary | ICD-10-CM | POA: Diagnosis not present

## 2022-10-21 DIAGNOSIS — Z1231 Encounter for screening mammogram for malignant neoplasm of breast: Secondary | ICD-10-CM | POA: Diagnosis not present

## 2022-10-21 DIAGNOSIS — N958 Other specified menopausal and perimenopausal disorders: Secondary | ICD-10-CM | POA: Diagnosis not present

## 2022-10-21 DIAGNOSIS — Z6823 Body mass index (BMI) 23.0-23.9, adult: Secondary | ICD-10-CM | POA: Diagnosis not present

## 2022-10-21 DIAGNOSIS — M8580 Other specified disorders of bone density and structure, unspecified site: Secondary | ICD-10-CM | POA: Diagnosis not present

## 2022-10-21 DIAGNOSIS — R29890 Loss of height: Secondary | ICD-10-CM | POA: Diagnosis not present

## 2022-10-21 LAB — HM DEXA SCAN

## 2022-10-22 ENCOUNTER — Other Ambulatory Visit (INDEPENDENT_AMBULATORY_CARE_PROVIDER_SITE_OTHER): Payer: Medicare HMO

## 2022-10-22 LAB — CALCIUM: Calcium: 10.1 mg/dL (ref 8.4–10.5)

## 2022-11-12 ENCOUNTER — Ambulatory Visit (INDEPENDENT_AMBULATORY_CARE_PROVIDER_SITE_OTHER): Payer: Medicare HMO

## 2022-11-12 VITALS — Ht 60.25 in | Wt 120.0 lb

## 2022-11-12 DIAGNOSIS — Z0000 Encounter for general adult medical examination without abnormal findings: Secondary | ICD-10-CM

## 2022-11-12 NOTE — Progress Notes (Signed)
I connected with  Alexandria Mcdowell on 11/12/22 by a audio enabled telemedicine application and verified that I am speaking with the correct person using two identifiers.  Patient Location: Home  Provider Location: Office/Clinic  I discussed the limitations of evaluation and management by telemedicine. The patient expressed understanding and agreed to proceed.  Subjective:   Alexandria Mcdowell is a 82 y.o. female who presents for Medicare Annual (Subsequent) preventive examination.  Review of Systems      Cardiac Risk Factors include: advanced age (>7mn, >>57women);sedentary lifestyle;hypertension     Objective:    Today's Vitals   11/12/22 1456  Weight: 120 lb (54.4 kg)  Height: 5' 0.25" (1.53 m)   Body mass index is 23.24 kg/m.     11/12/2022    3:05 PM 07/29/2021    2:00 PM 07/01/2019    9:30 AM 02/08/2019    6:07 AM 01/25/2019    1:01 AM 12/29/2018    1:49 PM 12/28/2018    6:37 PM  Advanced Directives  Does Patient Have a Medical Advance Directive? Yes Yes Yes Yes Yes No   Type of [place]aramed[place]of [place] will [place] will [place] will Living will;Healthcare Power of [place] will    Does patient want to make changes to medical advance directive? No - Patient declined Yes (MAU/Ambulatory/Procedural Areas - Information given)       Copy of [place] Chart? Yes - validated most recent copy scanned in chart (See row information) Yes - validated most recent copy scanned in chart (See row information) Yes - validated most recent copy scanned in chart (See row information) No - copy requested     Would patient like information on creating a medical advance directive?      No - Guardian declined No - Patient declined    Current Medications (verified) Outpatient Encounter Medications as of 11/12/2022  Medication Sig   alendronate  (FOSAMAX) 35 MG tablet Take 35 mg by mouth every Tuesday. Take with a full glass of water on an empty stomach.   apixaban (ELIQUIS) 2.5 MG TABS tablet TAKE 1 TABLET TWICE DAILY   atorvastatin (LIPITOR) 10 MG tablet TAKE 1 TABLET EVERY DAY   Cholecalciferol (D3 VITAMIN PO) Take 10 mcg by mouth 2 (two) times daily.   diltiazem (CARDIZEM CD) 240 MG 24 hr capsule TAKE 1 CAPSULE EVERY DAY   diltiazem (CARDIZEM) 30 MG tablet Take 1 tablet every 4 hours AS NEEDED for heart rate >100   lisinopril-hydrochlorothiazide (ZESTORETIC) 10-12.5 MG tablet TAKE 1 TABLET EVERY DAY   Multiple Vitamins-Minerals (MULTIVITAMIN WITH MINERALS) tablet Take 1 tablet by mouth daily with supper.    Omega-3 Fatty Acids (FISH OIL) 1200 MG CAPS Take 1,200 mg by mouth daily with supper.    Polyethyl Glycol-Propyl Glycol 0.4-0.3 % SOLN Place 1 drop into both eyes 3 (three) times daily as needed (for dry/irritated eyes.).   sodium chloride (OCEAN) 0.65 % SOLN nasal spray Place 1 spray into both nostrils 4 (four) times daily as needed for congestion (allergies.).    No facility-administered encounter medications on file as of 11/12/2022.    Allergies (verified) Clindamycin hcl, Codeine, Erythromycin, Tetracycline, Penicillins, and Sulfonamide derivatives   History: Past Medical History:  Diagnosis Date   Allergy    Anemia    Atypical mole 10/20/2001   Left Mid to Upper Back-Slight to Moderate(no treatment)   BCC (basal cell  carcinoma of skin) 02/07/2004   Left Shin(excision) Dr. 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 Tonia Brooms   SCCA (squamous cell carcinoma) of skin 08/03/2000   Left Shin([place]) Dr.  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 Tonia Brooms   SCCA (squamous cell carcinoma) of skin 09/17/2000   Right Forearm([place]) Dr. Susa Simmonds   SCCA (squamous cell carcinoma) of skin 09/17/2000   Right Lower Leg([place]) Dr. Susa Simmonds   SCCA (squamous cell carcinoma) of skin 09/17/2000   Left Upper Lower Leg,Lower Knee([place]) Dr. Susa Simmonds   SCCA (squamous cell carcinoma) of skin 09/17/2000   Left Lower Medial Leg([place]) Dr. Susa Simmonds   SCCA (squamous cell carcinoma) of skin 10/13/2000   Left Medial Ankle([place]) Dr. Susa Simmonds (excision)   SCCA (squamous cell carcinoma) of skin 10/13/2000   Right Lateral Ankle([place]) Dr. Susa Simmonds (excision)   SCCA (squamous cell carcinoma) of skin 12/03/2000   Left Upper Arm([place]) Dr. Susa Simmonds   SCCA (squamous cell carcinoma) of skin 12/03/2000   Left Med. Lower Leg([place]) Dr. Susa Simmonds (excision)   SCCA (squamous cell carcinoma) of skin 12/11/2000   Left Upper Thigh([place]) Dr. Susa Simmonds   SCCA (squamous cell carcinoma) of skin 12/15/2000   Left Thigh([place]) Dr. Susa Simmonds   SCCA (squamous cell carcinoma) of skin 01/18/2001   Left Forearm(in Situ) Dr. Tonia Brooms   SCCA (squamous cell carcinoma) of skin 01/18/2001   Left Hand 2nd/3rd map(in situ) Dr. 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 Tonia Brooms   SCCA (squamous cell carcinoma) of skin 10/20/2001   Right Ear Lobe([place]) Dr. Tonia Brooms (exc)   SCCA (squamous cell carcinoma) of skin 02/02/2002   Left Lower Leg Post Sup(in situ) Dr. 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 Tonia Brooms   SCCA (squamous cell carcinoma) of skin 04/03/2009   Left Post Shoulder(in situ)  treatment curet and 5FU   SCCA (squamous cell carcinoma) of skin 04/03/2009   Left Outer Thigh(in situ) treatment curet and 5FU   SCCA (squamous cell carcinoma) of skin 04/03/2009   Right Lower Leg(in situ) treatment curet and 5FU   SCCA (squamous cell carcinoma) of skin 07/08/2016   Left Inner Knee(in situ) treatment after biopsy   SCCA (squamous cell carcinoma) of skin 10/18/2019   Right Upper Arm - CX3 + 5FU   Superficial basal cell carcinoma (BCC) 02/19/2000   Left Forearm (Dr. Tonia Brooms)   Past Surgical History:  Procedure Laterality Date   APPENDECTOMY     ATRIAL FIBRILLATION ABLATION N/A 02/08/2019   Procedure: ATRIAL FIBRILLATION ABLATION;  Surgeon: Thompson Grayer, MD;  Location: [facility];  Service: Cardiovascular;  Laterality: N/A;   BREAST BIOPSY     CARDIOVERSION N/A 12/29/2018   Procedure: CARDIOVERSION;  Surgeon: Dorothy Spark, MD;  Location[facility];  Service: Cardiovascular;  Laterality: N/A;   CATARACT EXTRACTION  05/05/13   right eye    COLONOSCOPY  2006   [place]  TOOTH EXTRACTION     Family History  Problem Relation Age of Onset   Hypertension Mother    Hyperlipidemia Mother    Cancer Mother        breast/colon   Heart disease Mother    Diabetes Father    Emphysema Father    Hyperlipidemia Sister    Hypertension Sister    Hypothyroidism Sister    Social History   Socioeconomic History   Marital status: Married    Spouse name: Not on file   Number of children: Not on file   Years of education: Not on file   Highest education level: Not on file  Occupational History   Not on file  Tobacco Use   Smoking status: Never   Smokeless tobacco: Never  Vaping Use   Vaping Use: Never used  Substance and Sexual Activity   Alcohol use: No   Drug use: No   Sexual activity: Yes  Other Topics Concern   Not on file  Social History Narrative   HSG, [employer]-elementary ed   Married '64   3  sons-'66, '68, '71; 6 grandchildren   Work: taught school for 2 years , 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 Laymen's guide and forms provided.    Reviewed Aug '15      Exercsie:5 days a week, treadmill   Diet: healthy, weight watchers.         Attends [employer]   Social Determinants of Health   Financial Resource Strain: Low Risk  (07/29/2021)   Overall Financial Resource Strain (CARDIA)    Difficulty of Paying Living Expenses: Not hard at all  Food Insecurity: No Food Insecurity (11/12/2022)   Hunger Vital Sign    Worried About Running Out of Food in the Last Year: Never true    Ran Out of Food in the Last Year: Never true  Transportation Needs: No Transportation Needs (11/12/2022)   PRAPARE - Hydrologist (Medical): No    Lack of Transportation (Non-Medical): No  Physical Activity: Insufficiently Active (11/12/2022)   Exercise Vital Sign    Days of Exercise per Week: 3 days    Minutes of Exercise per Session: 20 min  Stress: No Stress Concern Present (11/12/2022)   [place]    Feeling of Stress : Not at all  Social Connections: Moderately Integrated (11/12/2022)   Social Connection and Isolation Panel [NHANES]    Frequency of Communication with Friends and Family: Twice a week    Frequency of Social Gatherings with Friends and Family: Twice a week    Attends Religious Services: More than 4 times per year    Active Member of [employer] or Organizations: No    Attends Music therapist: Never    Marital Status: Married    Tobacco Counseling Counseling given: Not Answered   Clinical Intake:  Pre-visit preparation completed: Yes  Pain : No/denies pain     Nutritional Risks: None Diabetes: No  How often do you need to have someone help you when you read instructions, pamphlets,  or other written materials from your doctor or pharmacy?: 1 - Never  Diabetic? no  Interpreter Needed?: No  Information entered by :: C.Nayib Remer LPN   Activities of Daily Living    11/12/2022    3:06 PM  In your present state  of health, do you have any difficulty performing the following activities:  Hearing? 0  Vision? 0  Difficulty concentrating or making decisions? 0  Walking or climbing stairs? 0  Dressing or bathing? 0  Doing errands, shopping? 0  Preparing Food and eating ? N  Using the Toilet? N  In the past six months, have you accidently leaked urine? N  Do you have problems with loss of bowel control? N  Managing your Medications? N  Managing your Finances? N  Housekeeping or managing your Housekeeping? N    Patient Care Team: Jinny Sanders, MD as PCP - General (Family Medicine) Josue Hector, MD as PCP - Cardiology (Cardiology) Thompson Grayer, MD (Inactive) as PCP - Electrophysiology (Cardiology) Lavonna Monarch, MD (Inactive) (Dermatology) Madilyn Hook, [place] as Consulting Physician (Optometry) Teena Irani, MD (Inactive) as Consulting Physician (Gastroenterology)  Indicate any recent Medical Services you may have received from other than [facility] providers in the past year (date may be approximate).     Assessment:   This is a routine wellness examination for Alexandria Mcdowell.  Hearing/Vision screen Hearing Screening - Comments:: Wears aids Vision Screening - Comments:: Glasses- [facility]  Dietary issues and exercise activities discussed: Current Exercise Habits: The patient does not participate in regular exercise at present, Exercise limited by: None identified   Goals Addressed             This Visit's Progress    Patient Stated       Stay healthy, and exercise more.       Depression Screen    11/12/2022    3:03 PM 10/08/2022   11:37 AM 07/29/2021    2:03 PM 07/13/2020    8:29 AM 07/01/2019    9:30 AM 06/23/2018    3:10 PM 06/17/2017   10:08 AM   PHQ 2/9 Scores  PHQ - 2 Score 0 0 0 0 0 0 0  PHQ- 9 Score 0 1   0 0 0    Fall Risk    11/12/2022    3:06 PM 10/08/2022   11:37 AM 07/29/2021    2:02 PM 07/13/2020    8:29 AM 07/01/2019    9:30 AM  Fall Risk   Falls in the past year? 0 1 0 0 1  Number falls in past yr: 0 0 0  0  Injury with Fall? 0 0 0  0  Risk for fall due to : No Fall Risks    Medication side effect  Follow up Falls prevention discussed;Falls evaluation completed Falls evaluation completed Falls prevention discussed  Falls evaluation completed;Falls prevention discussed    FALL RISK PREVENTION PERTAINING TO THE HOME:  Any stairs in or around the home? Yes  If so, are there any without handrails? No  Home free of loose throw rugs in walkways, pet beds, electrical cords, etc? Yes  Adequate lighting in your home to reduce risk of falls? Yes   ASSISTIVE DEVICES UTILIZED TO PREVENT FALLS:  Life alert? No  Use of a cane, walker or w/c? No  Grab bars in the bathroom? Yes  Shower chair or bench in shower? Yes  Elevated toilet seat or a handicapped toilet? Yes     Cognitive Function:    07/01/2019    9:32 AM 06/23/2018    3:10 PM 06/17/2017   10:12 AM 05/20/2016    9:07 AM  MMSE - Mini Mental State Exam  Orientation to time '5 5 5 5  '$ Orientation to Place 5 5 5  5  Registration '3 3 3 3  '$ Attention/ Calculation 5 0 0 0  Recall '3 3 3 3  '$ Language- name 2 objects  0 0 0  Language- repeat '1 1 1 1  '$ Language- follow 3 step command  '3 3 3  '$ Language- read & follow direction  0 0 0  Write a sentence  0 0 0  Copy design  0 0 0  Total score  '20 20 20        '$ 11/12/2022    3:06 PM  6CIT Screen  What Year? 0 points  What month? 0 points  What time? 0 points  Count back from 20 0 points  Months in reverse 0 points  Repeat phrase 0 points  Total Score 0 points    Immunizations Immunization History  Administered Date(s) Administered   Fluad Quad(high Dose 65+) 05/26/2020   Influenza Split 06/19/2011,  06/22/2012   Influenza Whole 06/29/2007, 06/20/2008, 06/13/2010   Influenza, High Dose Seasonal PF 06/06/2017, 06/15/2018, 06/07/2021   Influenza,inj,Quad PF,6+ Mos 05/17/2013, 05/12/2014, 05/18/2015, 05/20/2016, 05/10/2019   PFIZER(Purple Top)SARS-COV-2 Vaccination 10/02/2019, 10/23/2019, 06/11/2020, 12/28/2020   Pfizer Covid-19 Vaccine Bivalent Booster 62yr & up 06/07/2021, 02/02/2022   Pneumococcal Conjugate-13 05/18/2015   Pneumococcal Polysaccharide-23 02/23/2007   Td 01/30/2003   Tetanus 05/10/2013    TDAP status: Due, Education has been provided regarding the importance of this vaccine. Advised may receive this vaccine at local pharmacy or Health Dept. Aware to provide a copy of the vaccination record if obtained from local pharmacy or Health Dept. Verbalized acceptance and understanding.  Flu Vaccine status: Up to date [facility] [address]  Pneumococcal vaccine status: Up to date  Covid-19 vaccine status: Completed vaccines  Qualifies for Shingles Vaccine? Yes   Zostavax completed Yes   Shingrix Completed?: No.    Education has been provided regarding the importance of this vaccine. Patient has been advised to call insurance company to determine out of pocket expense if they have not yet received this vaccine. Advised may also receive vaccine at local pharmacy or Health Dept. Verbalized acceptance and understanding.  Screening Tests Health Maintenance  Topic Date Due   Zoster Vaccines- Shingrix (1 of 2) Never done   INFLUENZA VACCINE  04/15/2022   COVID-19 Vaccine (7 - 2023-24 season) 05/16/2022   MAMMOGRAM  07/29/2022   DEXA SCAN  04/17/2023   DTaP/Tdap/Td (3 - Tdap) 05/11/2023   Medicare Annual Wellness (AWV)  11/13/2023   Pneumonia Vaccine 82 Years old  Completed   HPV VACCINES  Aged Out    Health Maintenance  Health Maintenance Due  Topic Date Due   Zoster Vaccines- Shingrix (1 of 2) Never done   INFLUENZA VACCINE  04/15/2022   COVID-19 Vaccine (7 - 2023-24  season) 05/16/2022   MAMMOGRAM  07/29/2022    Colorectal cancer screening: No longer required.   Mammogram status: Completed 10/21/2022. Repeat every year Dr.Ledger  Bone Scan completed by DR.Ledger on 10/21/2022  Lung Cancer Screening: (Low Dose CT Chest recommended if Age 83-80years, 30 pack-year currently smoking OR have quit w/in 15years.) does not qualify.   Lung Cancer Screening Referral: no  Additional Screening:  Hepatitis C Screening: does not qualify; Completed no  Vision Screening: Recommended annual ophthalmology exams for early detection of glaucoma and other disorders of the eye. Is the patient up to date with their annual eye exam?  Yes  Who is the provider or what is the name of the office in which the patient attends annual  eye exams? [facility] If pt is not established with a provider, would they like to be referred to a provider to establish care? No .   Dental Screening: Recommended annual dental exams for proper oral hygiene  Community Resource Referral / Chronic Care Management: CRR required this visit?  No   CCM required this visit?  No      Plan:     I have personally reviewed and noted the following in the patient's chart:   Medical and social history Use of alcohol, tobacco or illicit drugs  Current medications and supplements including opioid prescriptions. Patient is not currently taking opioid prescriptions. Functional ability and status Nutritional status Physical activity Advanced directives List of other physicians Hospitalizations, surgeries, and ER visits in previous 12 months Vitals Screenings to include cognitive, depression, and falls Referrals and appointments  In addition, I have reviewed and discussed with patient certain preventive protocols, quality metrics, and best practice recommendations. A written personalized care plan for preventive services as well as general preventive health recommendations were provided to  patient.     Lebron Conners, LPN   [id number]   Nurse Notes: Dr.Ledger OB/GY[facility] performs pt's bone scans and mammograms. Pt will provide records.

## 2022-11-12 NOTE — Patient Instructions (Signed)
Alexandria Mcdowell , Thank you for taking time to come for your Medicare Wellness Visit. I appreciate your ongoing commitment to your health goals. Please review the following plan we discussed and let me know if I can assist you in the future.   These are the goals we discussed:  Goals      Increase physical activity     Starting 06/23/2018, I will continue to exercise for at least 30-45 min 5 days per week.      Patient Stated     07/01/2019, I will start increasing my physical activity and exercising more daily.      Patient Stated     Would like to continue drinking more water and eating healthier. Would like to start going back to the [facility]     Patient Stated     Stay healthy, and exercise more.        This is a list of the screening recommended for you and due dates:  Health Maintenance  Topic Date Due   Zoster (Shingles) Vaccine (1 of 2) Never done   Flu Shot  04/15/2022   COVID-19 Vaccine (7 - 2023-24 season) 05/16/2022   Mammogram  07/29/2022   DEXA scan (bone density measurement)  04/17/2023   DTaP/Tdap/Td vaccine (3 - Tdap) 05/11/2023   Medicare Annual Wellness Visit  11/13/2023   Pneumonia Vaccine  Completed   HPV Vaccine  Aged Out    Advanced directives: Copy is scanned into chart.  Conditions/risks identified: Aim for 30 minutes of exercise or brisk walking, 6-8 glasses of water, and 5 servings of fruits and vegetables each day.   Next appointment: Follow up in one year for your annual wellness visit 11/18/2023 @ 3:00 pm via telephone.   Preventive Care 58 Years and Older, Female Preventive care refers to lifestyle choices and visits with your health care provider that can promote health and wellness. What does preventive care include? A yearly physical exam. This is also called an annual well check. Dental exams once or twice a year. Routine eye exams. Ask your health care provider how often you should have your eyes checked. Personal lifestyle choices,  including: Daily care of your teeth and gums. Regular physical activity. Eating a healthy diet. Avoiding tobacco and drug use. Limiting alcohol use. Practicing safe sex. Taking low-dose aspirin every day. Taking vitamin and mineral supplements as recommended by your health care provider. What happens during an annual well check? The services and screenings done by your health care provider during your annual well check will depend on your age, overall health, lifestyle risk factors, and family history of disease. Counseling  Your health care provider may ask you questions about your: Alcohol use. Tobacco use. Drug use. Emotional well-being. Home and relationship well-being. Sexual activity. Eating habits. History of falls. Memory and ability to understand (cognition). Work and work Statistician. Reproductive health. Screening  You may have the following tests or measurements: Height, weight, and BMI. Blood pressure. Lipid and cholesterol levels. These may be checked every 5 years, or more frequently if you are over 56 years old. Skin check. Lung cancer screening. You may have this screening every year starting at age 23 if you have a 30-pack-year history of smoking and currently smoke or have quit within the past 15 years. Fecal occult blood test (FOBT) of the stool. You may have this test every year starting at age 45. Flexible sigmoidoscopy or colonoscopy. You may have a sigmoidoscopy every 5 years or a colonoscopy  every 10 years starting at age 67. Hepatitis C blood test. Hepatitis B blood test. Sexually transmitted disease (STD) testing. Diabetes screening. This is done by checking your blood sugar (glucose) after you have not eaten for a while (fasting). You may have this done every 1-3 years. Bone density scan. This is done to screen for osteoporosis. You may have this done starting at age 73. Mammogram. This may be done every 1-2 years. Talk to your health care provider  about how often you should have regular mammograms. Talk with your health care provider about your test results, treatment options, and if necessary, the need for more tests. Vaccines  Your health care provider may recommend certain vaccines, such as: Influenza vaccine. This is recommended every year. Tetanus, diphtheria, and acellular pertussis (Tdap, Td) vaccine. You may need a Td booster every 10 years. Zoster vaccine. You may need this after age 61. Pneumococcal 13-valent conjugate (PCV13) vaccine. One dose is recommended after age 61. Pneumococcal polysaccharide (PPSV23) vaccine. One dose is recommended after age 54. Talk to your health care provider about which screenings and vaccines you need and how often you need them. This information is not intended to replace advice given to you by your health care provider. Make sure you discuss any questions you have with your health care provider. Document Released: 09/28/2015 Document Revised: 05/21/2016 Document Reviewed: 07/03/2015 Elsevier Interactive Patient Education  2017 [place] Prevention in the Home Falls can cause injuries. They can happen to people of all ages. There are many things you can do to make your home safe and to help prevent falls. What can I do on the outside of my home? Regularly fix the edges of walkways and driveways and fix any cracks. Remove anything that might make you trip as you walk through a door, such as a raised step or threshold. Trim any bushes or trees on the path to your home. Use bright outdoor lighting. Clear any walking paths of anything that might make someone trip, such as rocks or tools. Regularly check to see if handrails are loose or broken. Make sure that both sides of any steps have handrails. Any raised decks and porches should have guardrails on the edges. Have any leaves, snow, or ice cleared regularly. Use sand or salt on walking paths during winter. Clean up any spills in  your garage right away. This includes oil or grease spills. What can I do in the bathroom? Use night lights. Install grab bars by the toilet and in the tub and shower. Do not use towel bars as grab bars. Use non-skid mats or decals in the tub or shower. If you need to sit down in the shower, use a plastic, non-slip stool. Keep the floor dry. Clean up any water that spills on the floor as soon as it happens. Remove soap buildup in the tub or shower regularly. Attach bath mats securely with double-sided non-slip rug tape. Do not have throw rugs and other things on the floor that can make you trip. What can I do in the bedroom? Use night lights. Make sure that you have a light by your bed that is easy to reach. Do not use any sheets or blankets that are too big for your bed. They should not hang down onto the floor. Have a firm chair that has side arms. You can use this for support while you get dressed. Do not have throw rugs and other things on the floor that can make  you trip. What can I do in the kitchen? Clean up any spills right away. Avoid walking on wet floors. Keep items that you use a lot in easy-to-reach places. If you need to reach something above you, use a strong step stool that has a grab bar. Keep electrical cords out of the way. Do not use floor polish or wax that makes floors slippery. If you must use wax, use non-skid floor wax. Do not have throw rugs and other things on the floor that can make you trip. What can I do with my stairs? Do not leave any items on the stairs. Make sure that there are handrails on both sides of the stairs and use them. Fix handrails that are broken or loose. Make sure that handrails are as long as the stairways. Check any carpeting to make sure that it is firmly attached to the stairs. Fix any carpet that is loose or worn. Avoid having throw rugs at the top or bottom of the stairs. If you do have throw rugs, attach them to the floor with carpet  tape. Make sure that you have a light switch at the top of the stairs and the bottom of the stairs. If you do not have them, ask someone to add them for you. What else can I do to help prevent falls? Wear shoes that: Do not have high heels. Have rubber bottoms. Are comfortable and fit you well. Are closed at the toe. Do not wear sandals. If you use a stepladder: Make sure that it is fully opened. Do not climb a closed stepladder. Make sure that both sides of the stepladder are locked into place. Ask someone to hold it for you, if possible. Clearly mark and make sure that you can see: Any grab bars or handrails. First and last steps. Where the edge of each step is. Use tools that help you move around (mobility aids) if they are needed. These include: Canes. Walkers. Scooters. Crutches. Turn on the lights when you go into a dark area. Replace any light bulbs as soon as they burn out. Set up your furniture so you have a clear path. Avoid moving your furniture around. If any of your floors are uneven, fix them. If there are any pets around you, be aware of where they are. Review your medicines with your doctor. Some medicines can make you feel dizzy. This can increase your chance of falling. Ask your doctor what other things that you can do to help prevent falls. This information is not intended to replace advice given to you by your health care provider. Make sure you discuss any questions you have with your health care provider. Document Released: 06/28/2009 Document Revised: 02/07/2016 Document Reviewed: 10/06/2014 Elsevier Interactive Patient Education  2017 [employer].

## 2022-11-24 ENCOUNTER — Other Ambulatory Visit (HOSPITAL_COMMUNITY): Payer: Self-pay | Admitting: Physician Assistant

## 2022-11-24 ENCOUNTER — Other Ambulatory Visit: Payer: Self-pay | Admitting: Family Medicine

## 2022-12-11 DIAGNOSIS — H2513 Age-related nuclear cataract, bilateral: Secondary | ICD-10-CM | POA: Diagnosis not present

## 2022-12-30 ENCOUNTER — Ambulatory Visit (HOSPITAL_COMMUNITY)
Admission: RE | Admit: 2022-12-30 | Discharge: 2022-12-30 | Disposition: A | Discharge status: Home / Self Care | Payer: Medicare HMO | Source: Ambulatory Visit | Attending: Physician Assistant | Admitting: Physician Assistant

## 2022-12-30 ENCOUNTER — Encounter (HOSPITAL_COMMUNITY): Payer: Self-pay | Admitting: Physician Assistant

## 2022-12-30 VITALS — BP 134/74 | HR 73 | Ht 60.25 in | Wt 123.0 lb

## 2022-12-30 DIAGNOSIS — D6869 Other thrombophilia: Secondary | ICD-10-CM | POA: Diagnosis not present

## 2022-12-30 DIAGNOSIS — I10 Essential (primary) hypertension: Secondary | ICD-10-CM | POA: Insufficient documentation

## 2022-12-30 DIAGNOSIS — I480 Paroxysmal atrial fibrillation: Secondary | ICD-10-CM | POA: Diagnosis not present

## 2022-12-30 NOTE — Progress Notes (Signed)
Primary Care Physician: Excell Seltzer, MD Primary Cardiologist: Dr Eden Emms (remotely) Primary Electrophysiologist: Dr Lalla Brothers (new) Referring Physician: DEBHORA TITUS is a 82 y.o. female with a history of paroxysmal atrial fibrillation, HTN, HLD who presents for follow up in the [facility]. She is s/p ablation with Dr Johney Frame on 02/08/19. Flecainide was discontinued.   On follow up today, patient reports that she had done very well since her last visit. She has not had any symptoms of afib. No bleeding issues on anticoagulation.   Today, she denies symptoms of palpitations, chest pain, shortness of breath, orthopnea, PND, lower extremity edema, dizziness, presyncope, syncope, snoring, daytime somnolence, bleeding, or neurologic sequela. The patient is tolerating medications without difficulties and is otherwise without complaint today.    Atrial Fibrillation Risk Factors:  she does not have symptoms or diagnosis of sleep apnea. she does not have a history of rheumatic fever. she does not have a history of alcohol use. The patient does not have a history of early familial atrial fibrillation or other arrhythmias.  she has a BMI of Body mass index is 23.82 kg/m.Marland Kitchen Filed Weights   12/30/22 0942  Weight: 55.8 kg    Family History  Problem Relation Age of Onset   Hypertension Mother    Hyperlipidemia Mother    Cancer Mother        breast/colon   Heart disease Mother    Diabetes Father    Emphysema Father    Hyperlipidemia Sister    Hypertension Sister    Hypothyroidism Sister      Atrial Fibrillation Management history:  Previous antiarrhythmic drugs: flecainide Previous cardioversions: 12/29/18, 01/04/19 Previous ablations: 02/08/19 CHADS2VASC score: 4  Anticoagulation history: Eliquis    Past Medical History:  Diagnosis Date   Allergy    Anemia    Atypical mole 10/20/2001   Left Mid to Upper Back-Slight to Moderate(no  treatment)   BCC (basal cell carcinoma of skin) 02/07/2004   Left Shin(excision) Dr. 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 Danella Deis   SCCA (squamous cell carcinoma) of skin 08/03/2000   Left Shin([place]) Dr. Danella Deis   SCCA (squamous cell carcinoma) of skin 08/03/2000   Right Shin([place]) Dr. Danella Deis   SCCA (squamous cell carcinoma) of skin 09/17/2000   Right Forearm([place]) Dr. Manus Rudd   SCCA (squamous cell carcinoma) of skin 09/17/2000   Right Lower Leg([place]) Dr. Manus Rudd   SCCA (squamous cell carcinoma) of skin 09/17/2000   Left Upper Lower Leg,Lower Knee([place]) Dr. Manus Rudd   SCCA (squamous cell carcinoma) of skin 09/17/2000   Left Lower Medial Leg([place]) Dr. Manus Rudd   SCCA (squamous cell carcinoma) of skin 10/13/2000   Left Medial Ankle([place]) Dr. Manus Rudd (excision)   SCCA (squamous cell carcinoma) of skin 10/13/2000   Right Lateral Ankle([place]) Dr. Manus Rudd (excision)   SCCA (squamous cell carcinoma) of skin 12/03/2000   Left Upper Arm([place]) Dr. Manus Rudd   SCCA (squamous cell carcinoma) of skin 12/03/2000   Left Med. Lower Leg([place]) Dr. Manus Rudd (excision)   SCCA (squamous cell carcinoma) of skin 12/11/2000   Left Upper Thigh([place]) Dr. Manus Rudd   SCCA (squamous cell carcinoma) of  skin 12/15/2000   Left Thigh([place]) Dr. Manus Rudd   SCCA (squamous cell carcinoma) of skin 01/18/2001   Left Forearm(in Situ) Dr. Danella Deis   SCCA (squamous cell carcinoma) of skin 01/18/2001   Left Hand 2nd/3rd map(in situ) Dr. Danella Deis)   SCCA (squamous  cell carcinoma) of skin 01/18/2001   Left Shin Lat(in situ) Dr. Danella Deis   SCCA (squamous cell carcinoma) of skin 01/18/2001   Left Calf(in situ) Dr. Danella Deis   SCCA (squamous cell carcinoma) of skin 10/20/2001   Right Ear Lobe([place]) Dr. Danella Deis (exc)   SCCA (squamous cell carcinoma) of skin 02/02/2002   Left Lower Leg Post Sup(in situ) Dr. 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 Danella Deis   SCCA (squamous cell carcinoma) of skin 04/03/2009   Left Post Shoulder(in situ) treatment curet and 5FU   SCCA (squamous cell carcinoma) of skin 04/03/2009   Left Outer Thigh(in situ) treatment curet and 5FU   SCCA (squamous cell carcinoma) of skin 04/03/2009   Right Lower Leg(in situ) treatment curet and 5FU   SCCA (squamous cell carcinoma) of skin 07/08/2016   Left Inner Knee(in situ) treatment after biopsy   SCCA (squamous cell carcinoma) of skin 10/18/2019   Right Upper Arm - CX3 + 5FU   Superficial basal cell carcinoma (BCC) 02/19/2000   Left Forearm (Dr. Danella Deis)   Past Surgical History:  Procedure Laterality Date   APPENDECTOMY     ATRIAL FIBRILLATION ABLATION N/A 02/08/2019   Procedure: ATRIAL FIBRILLATION ABLATION;  Surgeon: Hillis Range, MD;  Location: [facility] INVASIVE CV LAB;  Service: Cardiovascular;  Laterality: N/A;   BREAST BIOPSY     CARDIOVERSION N/A 12/29/2018   Procedure: CARDIOVERSION;  Surgeon: Lars Masson, MD;  Location: [facility] ENDOSCOPY;  Service: Cardiovascular;  Laterality: N/A;   CATARACT EXTRACTION  05/05/13   right eye    COLONOSCOPY  2006   ECTOPIC PREGNANCY SURGERY  1970   WISDOM TOOTH EXTRACTION      Current Outpatient Medications  Medication Sig Dispense Refill   alendronate (FOSAMAX) 35 MG tablet Take 35 mg by mouth every Tuesday. Take with a full glass of water on an empty stomach.     apixaban (ELIQUIS) 2.5 MG TABS tablet TAKE 1 TABLET TWICE DAILY 180 tablet 3   atorvastatin (LIPITOR) 10 MG tablet TAKE 1 TABLET  EVERY DAY 90 tablet 3   Cholecalciferol (D3 VITAMIN PO) Take 10 mcg by mouth 2 (two) times daily.     diltiazem (CARDIZEM CD) 240 MG 24 hr capsule TAKE 1 CAPSULE EVERY DAY 90 capsule 3   diltiazem (CARDIZEM) 30 MG tablet TAKE 1 TABLET EVERY 4 HOURS AS NEEDED FOR HEART RATE OVER 100 90 tablet 2   lisinopril-hydrochlorothiazide (ZESTORETIC) 10-12.5 MG tablet TAKE 1 TABLET EVERY DAY 90 tablet 3   Multiple Vitamins-Minerals (MULTIVITAMIN WITH MINERALS) tablet Take 1 tablet by mouth daily with supper.      Omega-3 Fatty Acids (FISH OIL) 1200 MG CAPS Take 1,200 mg by mouth daily with supper.      Polyethyl Glycol-Propyl Glycol 0.4-0.3 % SOLN Place 1 drop into both eyes 3 (three) times daily as needed (for dry/irritated eyes.).     sodium chloride (OCEAN) 0.65 % SOLN nasal spray Place 1 spray into both nostrils 4 (four) times daily as needed for congestion (allergies.).      No current facility-administered medications for this encounter.    Allergies  Allergen Reactions   Clindamycin Hcl Nausea And Vomiting  nervous   Codeine Nausea And Vomiting   Erythromycin Nausea And Vomiting   Tetracycline Nausea And Vomiting   Penicillins Rash    Did it involve swelling of the face/tongue/throat, SOB, or low BP? No Did it involve sudden or severe rash/hives, skin peeling, or any reaction on the inside of your mouth or nose? Yes Did you need to seek medical attention at a hospital or doctor's office? Yes When did it last happen?     childhood  If all above answers are "NO", may proceed with cephalosporin use.    Sulfonamide Derivatives Nausea And Vomiting and Rash    Social History   Socioeconomic History   Marital status: Married    Spouse name: Not on file   Number of children: Not on file   Years of education: Not on file   Highest education level: Not on file  Occupational History   Not on file  Tobacco Use   Smoking status: Never   Smokeless tobacco: Never   Tobacco comments:     Never smoke 12/30/22  Vaping Use   Vaping Use: Never used  Substance and Sexual Activity   Alcohol use: No   Drug use: No   Sexual activity: Yes  Other Topics Concern   Not on file  Social History Narrative   HSG, [employer]-elementary ed   Married '64   3 sons-'66, '68, '71; 6 grandchildren   Work: taught school for 2 years , 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 Laymen's guide and forms provided.    Reviewed Aug '15      Exercsie:5 days a week, treadmill   Diet: healthy, weight watchers.         Attends [employer]   Social Determinants of Health   Financial Resource Strain: Low Risk  (07/29/2021)   Overall Financial Resource Strain (CARDIA)    Difficulty of Paying Living Expenses: Not hard at all  Food Insecurity: No Food Insecurity (11/12/2022)   Hunger Vital Sign    Worried About Running Out of Food in the Last Year: Never true    Ran Out of Food in the Last Year: Never true  Transportation Needs: No Transportation Needs (11/12/2022)   PRAPARE - Administrator, Civil Service (Medical): No    Lack of Transportation (Non-Medical): No  Physical Activity: Insufficiently Active (11/12/2022)   Exercise Vital Sign    Days of Exercise per Week: 3 days    Minutes of Exercise per Session: 20 min  Stress: No Stress Concern Present (11/12/2022)   [employer] of Occupational Health - Occupational Stress Questionnaire    Feeling of Stress : Not at all  Social Connections: Moderately Integrated (11/12/2022)   Social Connection and Isolation Panel [NHANES]    Frequency of Communication with Friends and Family: Twice a week    Frequency of Social Gatherings with Friends and Family: Twice a week    Attends Religious Services: More than 4 times per year    Active Member of [employer] or Organizations: No    Attends Banker Meetings: Never    Marital Status:  Married  Catering manager Violence: Not At Risk (11/12/2022)   Humiliation, Afraid, Rape, and Kick questionnaire    Fear of Current or Ex-Partner: No    Emotionally Abused: No    Physically Abused: No    Sexually Abused: No  ROS- All systems are reviewed and negative except as per the HPI above.  Physical Exam: Vitals:   12/30/22 0942  BP: 134/74  Pulse: 73  Weight: 55.8 kg  Height: 5' 0.25" (1.53 m)    GEN- The patient is a well appearing elderly female, alert and oriented x 3 today.   HEENT-head normocephalic, atraumatic, sclera clear, conjunctiva pink, hearing intact, trachea midline. Lungs- Clear to ausculation bilaterally, normal work of breathing Heart- Regular rate and rhythm, no murmurs, rubs or gallops  GI- soft, NT, ND, + BS Extremities- no clubbing, cyanosis, or edema MS- no significant deformity or atrophy Skin- no rash or lesion Psych- euthymic mood, full affect Neuro- strength and sensation are intact   Wt Readings from Last 3 Encounters:  12/30/22 55.8 kg  11/12/22 54.4 kg  10/08/22 54.5 kg    EKG today demonstrates  SR, NST Vent. rate 73 BPM PR interval 166 ms QRS duration 78 ms QT/QTcB 376/414 ms  Echo 06/03/21 demonstrated  1. Left ventricular ejection fraction, by estimation, is 65 to 70%. The  left ventricle has normal function. The left ventricle has no regional  wall motion abnormalities. Left ventricular diastolic parameters were  normal.   2. Right ventricular systolic function is normal. The right ventricular  size is normal.   3. The mitral valve is normal in structure. Mild mitral valve  regurgitation.   4. The aortic valve is normal in structure. Aortic valve regurgitation is  not visualized.   Epic records are reviewed at length today   CHA2DS2-VASc Score = 4  The patient's score is based upon: CHF History: 0 HTN History: 1 Diabetes History: 0 Stroke History: 0 Vascular Disease History: 0 Age Score: 2 Gender Score: 1         ASSESSMENT AND PLAN: 1. Paroxysmal Atrial Fibrillation (ICD10:  I48.0) The patient's CHA2DS2-VASc score is 4, indicating a 4.8% annual risk of stroke.   S/p ablation 02/08/19 Patient appears to be maintaining SR.  Continue Eliquis 2.5 mg BID Continue diltiazem 240 mg daily with 30 mg PRN q 4 hours for heart racing.  2. Secondary Hypercoagulable State (ICD10:  D68.69) The patient is at significant risk for stroke/thromboembolism based upon her CHA2DS2-VASc Score of 4.  Continue Apixaban (Eliquis).   3. HTN Stable, no changes today.   Follow up with Dr Lalla Brothers per recall.    Jorja Loa PA-C [facility] [address] [place], [place] [address] [phone] 12/30/2022 10:28 AM

## 2023-05-03 NOTE — Progress Notes (Unsigned)
  Electrophysiology Office Note:   Date:  05/04/2023  ID:  [name], DOB Mar 17, 1941, MRN [id number]  Primary Cardiologist: Charlton Haws, MD Electrophysiologist: Hillis Range, MD (Inactive)      History of Present Illness:   Alexandria Mcdowell is a 82 y.o. female with h/o PAF, HTN, and HLD seen today for routine electrophysiology followup.   Since last being seen in our clinic the patient reports doing very well overall. She has not felt breakthrough AF.  She does take her prn diltiazem a few times a week, when she is stressed and feels like her heart "might go out" of rhythm. Otherwise, she denies chest pain, palpitations, dyspnea, PND, orthopnea, nausea, vomiting, dizziness, syncope, edema, weight gain, or early satiety.  She has a high stress level navigating her husbands medical issues (recent UTI / prostate issues)  Review of systems complete and found to be negative unless listed in HPI.   EP Information / Studies Reviewed:    EKG is ordered today. Personal review as below.       S/p ablation 02/08/2019 by Dr. Johney Frame Previously on flecainide, stopped s/p ablaiton  Physical Exam:   VS:  BP 102/60   Pulse 78   Ht 5' 0.25" (1.53 m)   Wt 117 lb (53.1 kg)   SpO2 98%   BMI 22.66 kg/m    Wt Readings from Last 3 Encounters:  05/04/23 117 lb (53.1 kg)  12/30/22 123 lb (55.8 kg)  11/12/22 120 lb (54.4 kg)     GEN: Well nourished, well developed in no acute distress NECK: No JVD; No carotid bruits CARDIAC: Regular rate and rhythm, no murmurs, rubs, gallops RESPIRATORY:  Clear to auscultation without rales, wheezing or rhonchi  ABDOMEN: Soft, non-tender, non-distended EXTREMITIES:  No edema; No deformity   ASSESSMENT AND PLAN:    Paroxysmal atrial fibrillation S/p ablation 02/08/2019 without recurrence Continue eliquis for CHA2DS2VASc  of at least 4 Continue diltiazem at 240 mg daily  Labs today  HTN Stable on current regimen   Follow up with Dr. Elberta Fortis in 6  months to establish from Dr. Johney Frame  Signed, Graciella Freer, PA-C

## 2023-05-04 ENCOUNTER — Encounter: Payer: Self-pay | Admitting: Student

## 2023-05-04 ENCOUNTER — Ambulatory Visit: Payer: Medicare HMO | Attending: Student | Admitting: Student

## 2023-05-04 VITALS — BP 102/60 | HR 78 | Ht 60.25 in | Wt 117.0 lb

## 2023-05-04 DIAGNOSIS — I480 Paroxysmal atrial fibrillation: Secondary | ICD-10-CM

## 2023-05-04 DIAGNOSIS — I10 Essential (primary) hypertension: Secondary | ICD-10-CM | POA: Diagnosis not present

## 2023-05-04 NOTE — Patient Instructions (Signed)
Medication Instructions:  Your physician recommends that you continue on your current medications as directed. Please refer to the Current Medication list given to you today.  *If you need a refill on your cardiac medications before your next appointment, please call your pharmacy*  Lab Work: BMET, CBC--TODAY If you have labs (blood work) drawn today and your tests are completely normal, you will receive your results only by: MyChart Message (if you have MyChart) OR A paper copy in the mail If you have any lab test that is abnormal or we need to change your treatment, we will call you to review the results.  Follow-Up: At [facility], you and your health needs are our priority.  As part of our continuing mission to provide you with exceptional heart care, we have created designated Provider Care Teams.  These Care Teams include your primary Cardiologist (physician) and Advanced Practice Providers (APPs -  Physician Assistants and Nurse Practitioners) who all work together to provide you with the care you need, when you need it.  Your next appointment:   6 month(s)  Provider:   Will Camnitz, MD  

## 2023-05-05 LAB — BASIC METABOLIC PANEL
BUN/Creatinine Ratio: 25 (ref 12–28)
BUN: 19 mg/dL (ref 8–27)
CO2: 27 mmol/L (ref 20–29)
Calcium: 9.8 mg/dL (ref 8.7–10.3)
Chloride: 100 mmol/L (ref 96–106)
Creatinine, Ser: 0.77 mg/dL (ref 0.57–1.00)
Glucose: 96 mg/dL (ref 70–99)
Potassium: 3.5 mmol/L (ref 3.5–5.2)
Sodium: 139 mmol/L (ref 134–144)
eGFR: 77 mL/min/{1.73_m2} (ref >59)

## 2023-05-05 LAB — CBC
Hematocrit: 39.4 % (ref 34.0–46.6)
Hemoglobin: 13.4 g/dL (ref 11.1–15.9)
MCH: 31 pg (ref 26.6–33.0)
MCHC: 34 g/dL (ref 31.5–35.7)
MCV: 91 fL (ref 79–97)
Platelets: 256 10*3/uL (ref 150–450)
RBC: 4.32 x10E6/uL (ref 3.77–5.28)
RDW: 12.9 % (ref 11.7–15.4)
WBC: 5.8 10*3/uL (ref 3.4–10.8)

## 2023-05-19 DIAGNOSIS — Z0100 Encounter for examination of eyes and vision without abnormal findings: Secondary | ICD-10-CM | POA: Diagnosis not present

## 2023-06-10 ENCOUNTER — Other Ambulatory Visit (HOSPITAL_COMMUNITY): Payer: Self-pay | Admitting: Physician Assistant

## 2023-07-22 ENCOUNTER — Other Ambulatory Visit: Payer: Self-pay | Admitting: Cardiovascular Disease

## 2023-07-22 DIAGNOSIS — I480 Paroxysmal atrial fibrillation: Secondary | ICD-10-CM

## 2023-07-22 NOTE — Telephone Encounter (Signed)
Eliquis 2.5mg  refill request received. Patient is 82 years old, weight-53.1kg, Crea-0.77 on 05/04/23, Diagnosis-Afib, and last seen by Otilio Saber on 05/04/23. Dose is appropriate based on dosing criteria. Will send in refill to requested pharmacy.

## 2023-09-10 ENCOUNTER — Other Ambulatory Visit: Payer: Self-pay | Admitting: Family Medicine

## 2023-09-10 NOTE — Telephone Encounter (Signed)
Spoke with pt, scheduled cpe for 11/26/23

## 2023-09-10 NOTE — Telephone Encounter (Signed)
Please schedule CPE with fasting labs prior for Dr. Ermalene Searing after 11/18/2023.

## 2023-10-27 DIAGNOSIS — Z01419 Encounter for gynecological examination (general) (routine) without abnormal findings: Secondary | ICD-10-CM | POA: Diagnosis not present

## 2023-10-27 DIAGNOSIS — Z6821 Body mass index (BMI) 21.0-21.9, adult: Secondary | ICD-10-CM | POA: Diagnosis not present

## 2023-10-27 DIAGNOSIS — Z1231 Encounter for screening mammogram for malignant neoplasm of breast: Secondary | ICD-10-CM | POA: Diagnosis not present

## 2023-10-27 DIAGNOSIS — M8580 Other specified disorders of bone density and structure, unspecified site: Secondary | ICD-10-CM | POA: Diagnosis not present

## 2023-10-27 LAB — HM MAMMOGRAPHY

## 2023-11-04 NOTE — Progress Notes (Unsigned)
Cardiology Office Note:  .   Date:  11/04/2023  ID:  [name], DOB 1941/07/13, MRN [id number] PCP: Excell Seltzer, MD  [facility] Providers Cardiologist:  Charlton Haws, MD Electrophysiologist:  Hillis Range, MD (Inactive) {  History of Present Illness: .   Alexandria Mcdowell is a 83 y.o. female w/PMHx of HTN, AFib  Saw Dr. Johney Frame last back in 2022 Seeing the AFib clinic a few times since then  Most recently saw Mardelle Matte 05/04/23, no overt AFib episodes though using PRN dilt regularly when in stressful situations to avoid episodes, reporting regular high stress with her husband's health particularly, using the dilt a few time a week typically Planne dto transition to Dr. Elberta Fortis, to see him in 6 mo  Placed on my schedule  Today's visit is scheduled as a 6 mo visit  ROS:   She has noted her HR a little higher then usual, her husband has had some health issues and she worries about him, attributes the elevated rate to that Denies any kind of CP No SOB, DOE No dizzy spells, near syncope or syncope No bleeding or signs of bleeding  Arrhythmia/AAD hx AFib ablation 02/08/2019 (Dr. Johney Frame) Flecainide >> stopped Nov 2021  Studies Reviewed: Marland Kitchen    EKG done today and reviewed by myself AFib 102bpm  06/03/2021: TTE 1. Left ventricular ejection fraction, by estimation, is 65 to 70%. The  left ventricle has normal function. The left ventricle has no regional  wall motion abnormalities. Left ventricular diastolic parameters were  normal.   2. Right ventricular systolic function is normal. The right ventricular  size is normal.   3. The mitral valve is normal in structure. Mild mitral valve  regurgitation.   4. The aortic valve is normal in structure. Aortic valve regurgitation is  not visualized.   02/08/2019: EPS/ablation CONCLUSIONS: 1. Sinus rhythm upon presentation.   2. 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 3. Successful electrical isolation and anatomical encircling of all four pulmonary veins with radiofrequency current. 4. No inducible arrhythmias following ablation both on and off of Isuprel 5. No early apparent complications.   Risk Assessment/Calculations:    Physical Exam:   VS:  There were no vitals taken for this visit.   Wt Readings from Last 3 Encounters:  05/04/23 117 lb (53.1 kg)  12/30/22 123 lb (55.8 kg)  11/12/22 120 lb (54.4 kg)    GEN: Well nourished, well developed in no acute distress NECK: No JVD; No carotid bruits CARDIAC: RRR, no murmurs, rubs, gallops RESPIRATORY:  CTA b/l without rales, wheezing or rhonchi  ABDOMEN: Soft, non-tender, non-distended EXTREMITIES: No edema; No deformity   ASSESSMENT AND PLAN: .    paroxysmal AFib CHA2DS2Vasc is 4, on Eliquis, appropriately dosed low burden by symptoms >> though a little surprised that she was in Afib today, perhaps not entirely surprised  She is really very concerned about her husband's health issues, and not too enthusiastic to do any kind of procedure (I discussed DCCV) She would be agreeable to wearing a monitor to assess rhythm burden and HRs Will increase her dilt to 300mg  daily  HTN Should tolerate small increase in dilt dosing  Secondary hypercoagulable state 2/2 AFib   Discussed need for new EP MD, will have her see dr. Jimmey Ralph (or myself) in 2 mo, sooner if needed   Dispo: as above  Signed, Sheilah Pigeon, PA-C

## 2023-11-06 ENCOUNTER — Encounter: Payer: Self-pay | Admitting: Physician Assistant

## 2023-11-06 ENCOUNTER — Ambulatory Visit (INDEPENDENT_AMBULATORY_CARE_PROVIDER_SITE_OTHER): Payer: Medicare HMO

## 2023-11-06 ENCOUNTER — Ambulatory Visit: Payer: Medicare HMO | Attending: Physician Assistant | Admitting: Physician Assistant

## 2023-11-06 ENCOUNTER — Ambulatory Visit: Payer: Medicare HMO | Admitting: Cardiology

## 2023-11-06 VITALS — BP 122/74 | HR 100 | Ht 60.25 in | Wt 115.6 lb

## 2023-11-06 DIAGNOSIS — I480 Paroxysmal atrial fibrillation: Secondary | ICD-10-CM | POA: Diagnosis not present

## 2023-11-06 DIAGNOSIS — D6869 Other thrombophilia: Secondary | ICD-10-CM | POA: Diagnosis not present

## 2023-11-06 DIAGNOSIS — I10 Essential (primary) hypertension: Secondary | ICD-10-CM

## 2023-11-06 MED ORDER — DILTIAZEM HCL ER COATED BEADS 300 MG PO CP24: 300.0000 mg | ORAL_CAPSULE | Freq: Every day | ORAL | 3 refills | Status: DC

## 2023-11-06 NOTE — Progress Notes (Unsigned)
 Enrolled patient for a 14 day Zio XT monitor to be mailed to patients home  Alexandria Mcdowell to read  ZIO serial # [id number] mailed to patient 11/06/23 and applied in office on 11/11/23.

## 2023-11-06 NOTE — Patient Instructions (Addendum)
Medication Instructions:    START TAKING:  DILTAIZEM 300 MG ONCE A DAY    *If you need a refill on your cardiac medications before your next appointment, please call your pharmacy*    Lab Work:  PLEASE GO DOWN STAIRS  [facility]  FIRST FLOOR  [address] ( GET OFF ELEVATORS MAKE A LEFT AND ANOTHER LEFT LAB ON RIGHT DOWN HALLWAY :     If you have labs (blood work) drawn today and your tests are completely normal, you will receive your results only by: MyChart Message (if you have MyChart) OR A paper copy in the mail If you have any lab test that is abnormal or we need to change your treatment, we will call you to review the results.   Testing/Procedures: Your physician has recommended that you wear an event monitor. Event monitors are medical devices that record the heart's electrical activity. Doctors most often [place] these monitors to diagnose arrhythmias. Arrhythmias are problems with the speed or rhythm of the heartbeat. The monitor is a small, portable device. You can wear one while you do your normal daily activities. This is usually used to diagnose what is causing palpitations/syncope (passing out).      Follow-Up: At [facility], you and your health needs are our priority.  As part of our continuing mission to provide you with exceptional heart care, we have created designated Provider Care Teams.  These Care Teams include your primary Cardiologist (physician) and Advanced Practice Providers (APPs -  Physician Assistants and Nurse Practitioners) who all work together to provide you with the care you need, when you need it.  We recommend signing up for the patient portal called "MyChart".  Sign up information is provided on this After Visit Summary.  MyChart is used to connect with patients for Virtual Visits (Telemedicine).  Patients are able to view lab/test results, encounter notes, upcoming appointments, etc.  Non-urgent messages can be sent to your provider as well.   To  learn more about what you can do with MyChart, go to [email or web address].    Your next appointment:  NEW PATIENT    2 month(s)  ( CONTACT  CASSIE HALL/ ANGELINE HAMMER FOR EP SCHEDULING ISSUES )\    Provider:     Nobie Putnam, MD    Other Instructions   ZIO XT- Long Term Monitor Instructions  Your physician has requested you wear a ZIO patch monitor for 14 days.  This is a single patch monitor. [employer] supplies one patch monitor per enrollment. Additional stickers are not available. Please do not apply patch if you will be having a Nuclear Stress Test,  Echocardiogram, Cardiac CT, MRI, or Chest Xray during the period you would be wearing the  monitor. The patch cannot be worn during these tests. You cannot remove and re-apply the  ZIO XT patch monitor.  Your ZIO patch monitor will be mailed 3 day USPS to your address on file. It may take 3-5 days  to receive your monitor after you have been enrolled.  Once you have received your monitor, please review the enclosed instructions. Your monitor  has already been registered assigning a specific monitor serial # to you.  Billing and Patient Assistance Program Information  We have supplied [employer] with any of your insurance information on file for billing purposes. [employer] offers a sliding scale Patient Assistance Program for patients that do not have  insurance, or whose insurance does not completely cover the cost of the ZIO monitor.  You must apply for the Patient Assistance Program to qualify for this discounted rate.  To apply, please call [employer] at [phone], select option 4, select option 2, ask to apply for  Patient Assistance Program. Meredeth Ide will ask your household income, and how many people  are in your household. They will quote your out-of-pocket cost based on that information.  [employer] will also be able to set up a 82-month, interest-free payment plan if needed.  Applying the monitor   Shave hair from  upper left chest.  Hold abrader disc by orange tab. Rub abrader in 40 strokes over the upper left chest as  indicated in your monitor instructions.  Clean area with 4 enclosed alcohol pads. Let dry.  Apply patch as indicated in monitor instructions. Patch will be placed under collarbone on left  side of chest with arrow pointing upward.  Rub patch adhesive wings for 2 minutes. Remove white label marked "1". Remove the white  label marked "2". Rub patch adhesive wings for 2 additional minutes.  While looking in a mirror, press and release button in center of patch. A small green light will  flash 3-4 times. This will be your only indicator that the monitor has been turned on.  Do not shower for the first 24 hours. You may shower after the first 24 hours.  Press the button if you feel a symptom. You will hear a small click. Record Date, Time and  Symptom in the Patient Logbook.  When you are ready to remove the patch, follow instructions on the last 2 pages of Patient  Logbook. Stick patch monitor onto the last page of Patient Logbook.  Place Patient Logbook in the blue and white box. Use locking tab on box and tape box closed  securely. The blue and white box has prepaid postage on it. Please place it in the mailbox as  soon as possible. Your physician should have your test results approximately 7 days after the  monitor has been mailed back to [facility].  Call [facility] Customer Care at [phone] if you have questions regarding  your ZIO XT patch monitor. Call them immediately if you see an orange light blinking on your  monitor.  If your monitor falls off in less than 4 days, contact our Monitor department at [phone].  If your monitor becomes loose or falls off after 4 days call [employer] at [phone] for  suggestions on securing your monitor    1st Floor: - Lobby - Registration  - Pharmacy  - Lab - Cafe  2nd Floor: - PV Lab - Diagnostic Testing (echo, CT,  nuclear med)  3rd Floor: - Vacant  4th Floor: - TCTS (cardiothoracic surgery) - AFib Clinic - Structural Heart Clinic - Vascular Surgery  - Vascular Ultrasound  5th Floor: - HeartCare Cardiology (general and EP) - Clinical Pharmacy for coumadin, hypertension, lipid, weight-loss medications, and med management appointments    Valet parking services will be available as well.

## 2023-11-11 ENCOUNTER — Telehealth: Payer: Self-pay | Admitting: Physician Assistant

## 2023-11-11 DIAGNOSIS — I480 Paroxysmal atrial fibrillation: Secondary | ICD-10-CM

## 2023-11-11 NOTE — Telephone Encounter (Signed)
 Patient scheduled to have her ZIO patch monitor applied at the [facility] office, 11/11/23, 3:30PM.

## 2023-11-11 NOTE — Telephone Encounter (Signed)
 Patient calling to with questions concerning her testing. Please advise

## 2023-11-18 ENCOUNTER — Ambulatory Visit: Payer: Medicare HMO

## 2023-11-18 VITALS — BP 115/72 | HR 85 | Ht 60.25 in | Wt 115.0 lb

## 2023-11-18 DIAGNOSIS — Z0000 Encounter for general adult medical examination without abnormal findings: Secondary | ICD-10-CM | POA: Diagnosis not present

## 2023-11-18 NOTE — Patient Instructions (Signed)
 Ms. Vonita Moss , Thank you for taking time to come for your Medicare Wellness Visit. I appreciate your ongoing commitment to your health goals. Please review the following plan we discussed and let me know if I can assist you in the future.   Referrals/Orders/Follow-Ups/Clinician Recommendations: none  This is a list of the screening recommended for you and due dates:  Health Maintenance  Topic Date Due   Zoster (Shingles) Vaccine (1 of 2) Never done   Mammogram  07/29/2022   Flu Shot  04/16/2023   DEXA scan (bone density measurement)  04/17/2023   DTaP/Tdap/Td vaccine (3 - Tdap) 05/11/2023   COVID-19 Vaccine (8 - 2024-25 season) 05/17/2023   Medicare Annual Wellness Visit  11/17/2024   Pneumonia Vaccine  Completed   HPV Vaccine  Aged Out   Hepatitis C Screening  Discontinued    Advanced directives: (In Chart) A copy of your advanced directives are scanned into your chart should your provider ever need it.  Next Medicare Annual Wellness Visit scheduled for next year: Yes 11/18/24 @ 3:40pm televisit

## 2023-11-18 NOTE — Progress Notes (Signed)
 Subjective:   Alexandria Mcdowell is a 83 y.o. who presents for a Medicare Wellness preventive visit.  Visit Complete: Virtual I connected with  Alexandria Mcdowell on 11/18/23 by a audio enabled telemedicine application and verified that I am speaking with the correct person using two identifiers.  Patient Location: Home  Provider Location: Office/Clinic  I discussed the limitations of evaluation and management by telemedicine. The patient expressed understanding and agreed to proceed.  Vital Signs: Because this visit was a virtual/telehealth visit, some criteria may be missing or patient reported. Any vitals not documented were not able to be obtained and vitals that have been documented are patient reported.  VideoDeclined- This patient declined Librarian, academic. Therefore the visit was completed with audio only.  AWV Questionnaire: No: Patient Medicare AWV questionnaire was not completed prior to this visit.  Cardiac Risk Factors include: advanced age (>23men, >35 women);dyslipidemia;hypertension;sedentary lifestyle     Objective:    Today's Vitals   11/18/23 1538  BP: 115/72  Pulse: 85  Weight: 115 lb (52.2 kg)  Height: 5' 0.25" (1.53 m)   Body mass index is 22.27 kg/m.     11/18/2023    3:57 PM 11/12/2022    3:05 PM 07/29/2021    2:00 PM 07/01/2019    9:30 AM 02/08/2019    6:07 AM 01/25/2019    1:01 AM 12/29/2018    1:49 PM  Advanced Directives  Does Patient Have a Medical Advance Directive? Yes Yes Yes Yes Yes Yes No  Type of Estate agent of [place];Living will Healthcare Power of [place];Living will Healthcare Power of [place];Living will Healthcare Power of [place];Living will Living will;Healthcare Power of [employer] Power of [place];Living will   Does patient want to make changes to medical advance directive?  No - Patient declined Yes (MAU/Ambulatory/Procedural Areas - Information given)      Copy of  Healthcare Power of Attorney in Chart? Yes - validated most recent copy scanned in chart (See row information) Yes - validated most recent copy scanned in chart (See row information) Yes - validated most recent copy scanned in chart (See row information) Yes - validated most recent copy scanned in chart (See row information) No - copy requested    Would patient like information on creating a medical advance directive?       No - Guardian declined    Current Medications (verified) Outpatient Encounter Medications as of 11/18/2023  Medication Sig   alendronate (FOSAMAX) 35 MG tablet Take 35 mg by mouth every Tuesday. Take with a full glass of water on an empty stomach.   apixaban (ELIQUIS) 2.5 MG TABS tablet TAKE 1 TABLET TWICE DAILY   atorvastatin (LIPITOR) 10 MG tablet TAKE 1 TABLET EVERY DAY   Cholecalciferol (D3 VITAMIN PO) Take 10 mcg by mouth 2 (two) times daily.   diltiazem (CARDIZEM CD) 300 MG 24 hr capsule Take 1 capsule (300 mg total) by mouth daily.   diltiazem (CARDIZEM) 30 MG tablet TAKE 1 TABLET EVERY 4 HOURS AS NEEDED FOR HEART RATE OVER 100   lisinopril-hydrochlorothiazide (ZESTORETIC) 10-12.5 MG tablet TAKE 1 TABLET EVERY DAY   Multiple Vitamins-Minerals (MULTIVITAMIN WITH MINERALS) tablet Take 1 tablet by mouth daily with supper.    Omega-3 Fatty Acids (FISH OIL) 1200 MG CAPS Take 1,200 mg by mouth daily with supper.    Polyethyl Glycol-Propyl Glycol 0.4-0.3 % SOLN Place 1 drop into both eyes 3 (three) times daily as needed (for dry/irritated eyes.).  sodium chloride (OCEAN) 0.65 % SOLN nasal spray Place 1 spray into both nostrils 4 (four) times daily as needed for congestion (allergies.).    No facility-administered encounter medications on file as of 11/18/2023.    Allergies (verified) Clindamycin hcl, Codeine, Erythromycin, Tetracycline, Penicillins, and Sulfonamide derivatives   History: Past Medical History:  Diagnosis Date   Allergy    Anemia    Atypical mole  10/20/2001   Left Mid to Upper Back-Slight to Moderate(no treatment)   BCC (basal cell carcinoma of skin) 02/07/2004   Left Shin(excision) Dr. 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 Danella Deis   SCCA (squamous cell carcinoma) of skin 08/03/2000   Left Shin([place]) Dr. Danella Deis   SCCA (squamous cell carcinoma) of skin 08/03/2000   Right Shin([place]) Dr. Danella Deis   SCCA (squamous cell carcinoma) of skin 09/17/2000   Right Forearm([place]) Dr. Manus Rudd   SCCA (squamous cell carcinoma) of skin 09/17/2000   Right Lower Leg([place]) Dr. Manus Rudd   SCCA (squamous cell carcinoma) of skin 09/17/2000   Left Upper Lower Leg,Lower Knee([place]) Dr. Manus Rudd   SCCA (squamous cell carcinoma) of skin 09/17/2000   Left Lower Medial Leg([place]) Dr. Manus Rudd   SCCA (squamous cell carcinoma) of skin 10/13/2000   Left Medial Ankle([place]) Dr. Manus Rudd (excision)   SCCA (squamous cell carcinoma) of skin 10/13/2000   Right Lateral Ankle([place]) Dr. Manus Rudd (excision)   SCCA (squamous cell carcinoma) of skin 12/03/2000   Left Upper Arm([place]) Dr. Manus Rudd   SCCA (squamous cell carcinoma) of skin 12/03/2000   Left Med. Lower Leg([place]) Dr. Manus Rudd (excision)   SCCA (squamous cell carcinoma) of skin 12/11/2000   Left Upper Thigh([place]) Dr. Manus Rudd   SCCA (squamous cell carcinoma) of skin 12/15/2000   Left Thigh([place]) Dr. Manus Rudd   SCCA (squamous cell carcinoma) of skin 01/18/2001   Left Forearm(in Situ) Dr. Danella Deis   SCCA (squamous cell carcinoma) of skin  01/18/2001   Left Hand 2nd/3rd map(in situ) Dr. Danella Deis)   SCCA (squamous cell carcinoma) of skin 01/18/2001   Left Shin Lat(in situ) Dr. Danella Deis   SCCA (squamous cell carcinoma) of skin 01/18/2001   Left Calf(in situ) Dr. Danella Deis   SCCA (squamous cell carcinoma) of skin 10/20/2001   Right Ear Lobe([place]) Dr. Danella Deis (exc)   SCCA (squamous cell carcinoma) of skin 02/02/2002   Left Lower Leg Post Sup(in situ) Dr. 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 Danella Deis   SCCA (squamous cell carcinoma) of skin 04/03/2009   Left Post Shoulder(in situ) treatment curet and 5FU   SCCA (squamous cell carcinoma) of skin 04/03/2009   Left Outer Thigh(in situ) treatment curet and 5FU   SCCA (squamous cell carcinoma) of skin 04/03/2009   Right Lower Leg(in situ) treatment curet and 5FU   SCCA (squamous cell carcinoma) of skin 07/08/2016   Left Inner Knee(in situ) treatment after biopsy   SCCA (squamous cell carcinoma) of skin 10/18/2019   Right Upper Arm - CX3 + 5FU   Superficial basal cell carcinoma (BCC) 02/19/2000   Left Forearm (Dr. Danella Deis)   Past Surgical History:  Procedure Laterality Date   APPENDECTOMY  ATRIAL FIBRILLATION ABLATION N/A 02/08/2019   Procedure: ATRIAL FIBRILLATION ABLATION;  Surgeon: Hillis Range, MD;  Location: [facility] INVASIVE CV LAB;  Service: Cardiovascular;  Laterality: N/A;   BREAST BIOPSY     CARDIOVERSION N/A 12/29/2018   Procedure: CARDIOVERSION;  Surgeon: Lars Masson, MD;  Location[facility];  Service: Cardiovascular;  Laterality: N/A;   CATARACT EXTRACTION  05/05/13   right eye    COLONOSCOPY  2006   ECTOPIC PREGNANCY SURGERY  1970   WISDOM TOOTH EXTRACTION     Family History  Problem Relation Age of Onset   Hypertension Mother    Hyperlipidemia Mother    Cancer Mother        breast/colon   Heart disease Mother    Diabetes Father    Emphysema Father    Hyperlipidemia Sister    Hypertension Sister     Hypothyroidism Sister    Social History   Socioeconomic History   Marital status: Married    Spouse name: Not on file   Number of children: Not on file   Years of education: Not on file   Highest education level: Not on file  Occupational History   Not on file  Tobacco Use   Smoking status: Never   Smokeless tobacco: Never   Tobacco comments:    Never smoke 12/30/22  Vaping Use   Vaping status: Never Used  Substance and Sexual Activity   Alcohol use: No   Drug use: No   Sexual activity: Yes  Other Topics Concern   Not on file  Social History Narrative   HSG, [employer]-elementary ed   Married '64   3 sons-'66, '68, '71; 6 grandchildren   Work: taught school for 2 years , 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 Laymen's guide and forms provided.    Reviewed Aug '15      Exercsie:5 days a week, treadmill   Diet: healthy, weight watchers.         Attends [employer]   Social Drivers of Health   Financial Resource Strain: Low Risk  (11/18/2023)   Overall Financial Resource Strain (CARDIA)    Difficulty of Paying Living Expenses: Not hard at all  Food Insecurity: No Food Insecurity (11/18/2023)   Hunger Vital Sign    Worried About Running Out of Food in the Last Year: Never true    Ran Out of Food in the Last Year: Never true  Transportation Needs: No Transportation Needs (11/18/2023)   PRAPARE - Administrator, Civil Service (Medical): No    Lack of Transportation (Non-Medical): No  Physical Activity: Inactive (11/18/2023)   Exercise Vital Sign    Days of Exercise per Week: 0 days    Minutes of Exercise per Session: 0 min  Stress: No Stress Concern Present (11/18/2023)   [employer] of Occupational Health - Occupational Stress Questionnaire    Feeling of Stress : Only a little  Social Connections: Moderately Integrated (11/18/2023)   Social Connection  and Isolation Panel [NHANES]    Frequency of Communication with Friends and Family: Twice a week    Frequency of Social Gatherings with Friends and Family: Twice a week    Attends Religious Services: More than 4 times per year    Active Member of [employer] or Organizations: No    Attends Banker Meetings: Never    Marital Status: Married  Tobacco Counseling Counseling given: Not Answered Tobacco comments: Never smoke 12/30/22  Clinical Intake:  Pre-visit preparation completed: Yes  Pain : No/denies pain    BMI - recorded: 22.27 Nutritional Status: BMI of 19-24  Normal Nutritional Risks: None Diabetes: No  How often do you need to have someone help you when you read instructions, pamphlets, or other written materials from your doctor or pharmacy?: 1 - Never  Interpreter Needed?: No  Comments: lives with husband Information entered by :: B.Sylvana Bonk,LPN   Activities of Daily Living     11/18/2023    3:58 PM  In your present state of health, do you have any difficulty performing the following activities:  Hearing? 1  Vision? 0  Difficulty concentrating or making decisions? 1  Walking or climbing stairs? 0  Dressing or bathing? 0  Doing errands, shopping? 0  Preparing Food and eating ? N  Using the Toilet? N  In the past six months, have you accidently leaked urine? N  Do you have problems with loss of bowel control? N  Managing your Medications? N  Managing your Finances? N  Housekeeping or managing your Housekeeping? N    Patient Care Team: Excell Seltzer, MD as PCP - General (Family Medicine) Wendall Stade, MD as PCP - Cardiology (Cardiology) Hillis Range, MD (Inactive) as PCP - Electrophysiology (Cardiology) Janalyn Harder, MD (Inactive) (Dermatology) Illene Labrador, OD as Consulting Physician (Optometry) Dorena Cookey, MD (Inactive) as Consulting Physician (Gastroenterology)  Indicate any recent Medical Services you may have received from other  than [facility] providers in the past year (date may be approximate).     Assessment:   This is a routine wellness examination for Alexandria Mcdowell.  Hearing/Vision screen Hearing Screening - Comments:: Pt says she has hearing aids that will need &quot;tuned up in a bit&quot; Vision Screening - Comments:: Pt says her vision is good with glasses    Goals Addressed             This Visit's Progress    COMPLETED: Patient Stated       07/01/2019, I will start increasing my physical activity and exercising more daily.      Patient Stated   On track    11/18/23-will continue:Would like to continue drinking more water and eating healthier.  Would like to start going back to the [facility]     Patient Stated   On track    11/18/23-Stay healthy, and exercise more.       Depression Screen     11/18/2023    3:50 PM 11/12/2022    3:03 PM 10/08/2022   11:37 AM 07/29/2021    2:03 PM 07/13/2020    8:29 AM 07/01/2019    9:30 AM 06/23/2018    3:10 PM  PHQ 2/9 Scores  PHQ - 2 Score 0 0 0 0 0 0 0  PHQ- 9 Score  0 1   0 0    Fall Risk     11/18/2023    3:43 PM 11/12/2022    3:06 PM 10/08/2022   11:37 AM 07/29/2021    2:02 PM 07/13/2020    8:29 AM  Fall Risk   Falls in the past year? 0 0 1 0 0  Number falls in past yr: 0 0 0 0   Injury with Fall? 0 0 0 0   Risk for fall due to : No Fall Risks No Fall Risks     Follow up Education provided;Falls prevention discussed Falls prevention discussed;Falls  evaluation completed Falls evaluation completed Falls prevention discussed     MEDICARE RISK AT HOME:  Medicare Risk at Home Any stairs in or around the home?: Yes If so, are there any without handrails?: Yes Home free of loose throw rugs in walkways, pet beds, electrical cords, etc?: Yes Adequate lighting in your home to reduce risk of falls?: Yes Life alert?: No Use of a cane, walker or w/c?: No Grab bars in the bathroom?: Yes Shower chair or bench in shower?: Yes Elevated toilet seat or a handicapped  toilet?: Yes  TIMED UP AND GO:  Was the test performed?  No  Cognitive Function: 6CIT completed    07/01/2019    9:32 AM 06/23/2018    3:10 PM 06/17/2017   10:12 AM 05/20/2016    9:07 AM  MMSE - Mini Mental State Exam  Orientation to time 5 5 5 5   Orientation to Place 5 5 5 5   Registration 3 3 3 3   Attention/ Calculation 5 0 0 0  Recall 3 3 3 3   Language- name 2 objects  0 0 0  Language- repeat 1 1 1 1   Language- follow 3 step command  3 3 3   Language- read & follow direction  0 0 0  Write a sentence  0 0 0  Copy design  0 0 0  Total score  20 20 20         11/18/2023    3:59 PM 11/12/2022    3:06 PM  6CIT Screen  What Year? 0 points 0 points  What month? 0 points 0 points  What time? 0 points 0 points  Count back from 20 0 points 0 points  Months in reverse 0 points 0 points  Repeat phrase 4 points 0 points  Total Score 4 points 0 points    Immunizations Immunization History  Administered Date(s) Administered   Fluad Quad(high Dose 65+) 05/26/2020   Influenza Split 06/19/2011, 06/22/2012   Influenza Whole 06/29/2007, 06/20/2008, 06/13/2010   Influenza, High Dose Seasonal PF 06/06/2017, 06/15/2018, 06/07/2021   Influenza,inj,Quad PF,6+ Mos 05/17/2013, 05/12/2014, 05/18/2015, 05/20/2016, 05/10/2019   PFIZER(Purple Top)SARS-COV-2 Vaccination 10/02/2019, 10/23/2019, 06/11/2020, 12/28/2020   Pfizer Covid-19 Vaccine Bivalent Booster 71yrs & up 06/07/2021, 02/02/2022   Pfizer(Comirnaty)Fall Seasonal Vaccine 12 years and older 07/24/2022   Pneumococcal Conjugate-13 05/18/2015   Pneumococcal Polysaccharide-23 02/23/2007   Td 01/30/2003   Tetanus 05/10/2013    Screening Tests Health Maintenance  Topic Date Due   Zoster Vaccines- Shingrix (1 of 2) Never done   MAMMOGRAM  07/29/2022   INFLUENZA VACCINE  04/16/2023   DEXA SCAN  04/17/2023   DTaP/Tdap/Td (3 - Tdap) 05/11/2023   COVID-19 Vaccine (8 - 2024-25 season) 05/17/2023   Medicare Annual Wellness (AWV)  11/17/2024    Pneumonia Vaccine 55+ Years old  Completed   HPV VACCINES  Aged Out   Hepatitis C Screening  Discontinued    Health Maintenance  Health Maintenance Due  Topic Date Due   Zoster Vaccines- Shingrix (1 of 2) Never done   MAMMOGRAM  07/29/2022   INFLUENZA VACCINE  04/16/2023   DEXA SCAN  04/17/2023   DTaP/Tdap/Td (3 - Tdap) 05/11/2023   COVID-19 Vaccine (8 - 2024-25 season) 05/17/2023   Health Maintenance Items Addressed: none needed  Additional Screening:  Vision Screening: Recommended annual ophthalmology exams for early detection of glaucoma and other disorders of the eye.  Dental Screening: Recommended annual dental exams for proper oral hygiene  Community Resource Referral / Chronic Care  Management: CRR required this visit?  No   CCM required this visit?  No    Plan:     I have personally reviewed and noted the following in the patient's chart:   Medical and social history Use of alcohol, tobacco or illicit drugs  Current medications and supplements including opioid prescriptions. Patient is not currently taking opioid prescriptions. Functional ability and status Nutritional status Physical activity Advanced directives List of other physicians Hospitalizations, surgeries, and ER visits in previous 12 months Vitals Screenings to include cognitive, depression, and falls Referrals and appointments  In addition, I have reviewed and discussed with patient certain preventive protocols, quality metrics, and best practice recommendations. A written personalized care plan for preventive services as well as general preventive health recommendations were provided to patient.    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 She does inquire if she is to still take Lisinopril-hydrochlorothiazide. She will discuss at PCP appt next week.

## 2023-11-26 ENCOUNTER — Encounter: Payer: Self-pay | Admitting: Family Medicine

## 2023-11-26 ENCOUNTER — Ambulatory Visit (INDEPENDENT_AMBULATORY_CARE_PROVIDER_SITE_OTHER): Payer: Medicare HMO | Admitting: Family Medicine

## 2023-11-26 VITALS — BP 120/80 | HR 56 | Temp 98.4°F | Ht 60.0 in | Wt 114.2 lb

## 2023-11-26 DIAGNOSIS — R001 Bradycardia, unspecified: Secondary | ICD-10-CM | POA: Diagnosis not present

## 2023-11-26 DIAGNOSIS — Z862 Personal history of diseases of the blood and blood-forming organs and certain disorders involving the immune mechanism: Secondary | ICD-10-CM | POA: Diagnosis not present

## 2023-11-26 DIAGNOSIS — M8580 Other specified disorders of bone density and structure, unspecified site: Secondary | ICD-10-CM

## 2023-11-26 DIAGNOSIS — D6869 Other thrombophilia: Secondary | ICD-10-CM

## 2023-11-26 DIAGNOSIS — E7800 Pure hypercholesterolemia, unspecified: Secondary | ICD-10-CM

## 2023-11-26 DIAGNOSIS — Z0000 Encounter for general adult medical examination without abnormal findings: Secondary | ICD-10-CM

## 2023-11-26 DIAGNOSIS — R7303 Prediabetes: Secondary | ICD-10-CM

## 2023-11-26 DIAGNOSIS — I480 Paroxysmal atrial fibrillation: Secondary | ICD-10-CM | POA: Diagnosis not present

## 2023-11-26 DIAGNOSIS — I10 Essential (primary) hypertension: Secondary | ICD-10-CM | POA: Diagnosis not present

## 2023-11-26 NOTE — Assessment & Plan Note (Signed)
Stable, chronic.  Continue current medication.   Good control on lisinopril HCTZ,  and diltiazem 

## 2023-11-26 NOTE — Assessment & Plan Note (Addendum)
 Chronic, asymptomatic,  Now on higher dose diltiazem 300 mg daily

## 2023-11-26 NOTE — Patient Instructions (Addendum)
 Get back on track with regular exercise  Call set up skin exam with Dr. Jason Nest:  [phone]. I will look into  bone density  per Dr. Velvet Bathe and if you are able to stop fosamax.

## 2023-11-26 NOTE — Assessment & Plan Note (Addendum)
 Rate controlled s/p ablation. Now off flecainide.  On diltiazem 300 mg daily for  rate control.  on longterm eliquis.

## 2023-11-26 NOTE — Assessment & Plan Note (Addendum)
 ON fosamax per GYN, but it appears she started this in 2019.  She has completed her 4 to 5-year course.  Will  get records from GYN

## 2023-11-26 NOTE — Assessment & Plan Note (Addendum)
 Stable in past, chronic.  Continue current medication.  At goal on atorvastatin 10 mg daily

## 2023-11-26 NOTE — Assessment & Plan Note (Signed)
 Due to drug.  On Eliquis.

## 2023-11-26 NOTE — Progress Notes (Addendum)
 Patient ID: ALEZA PEW, female    DOB: 1941/06/02, 83 y.o.   MRN: [id number]  This visit was conducted in person.  BP 120/80 (BP Location: Left Arm, Patient Position: Sitting, Cuff Size: Normal)   Pulse (!) 56   Temp 98.4 F (36.9 C) (Temporal)   Ht 5' (1.524 m)   Wt 114 lb 4 oz (51.8 kg)   SpO2 97%   BMI 22.31 kg/m    CC: Chief Complaint  Patient presents with   Annual Exam    Part 2 ([facility] 11/18/23)    Subjective:   HPI: Alexandria Mcdowell is a 83 y.o. female presenting on 11/26/2023 for Annual Exam (Part 2 (M[facility] 11/18/23))  The patient presents for complete physical and review of chronic health problems. He/She also has the following acute concerns today: none  The patient saw a LPN or RN for medicare wellness visit.  November 18, 2023  Prevention and wellness was reviewed in detail. Note reviewed and important notes copied below.   Hypertension:   Good control on lisinopril HCTZ,  and diltiazem BP Readings from Last 3 Encounters:  11/26/23 120/80  11/18/23 115/72  11/06/23 122/74  Using medication without problems or lightheadedness:  none Chest pain with exertion:none Edema:none Short of breath: none Average home BPs: Other issues:   Flowsheet Row Clinical Support from 11/18/2023 in [facility]  PHQ-2 Total Score 0        Reviewed last OV note from 11/06/2023, PA associated with Dr. Johney Frame ( cardiology) for paroxsysaml afib. At that office visit she was in A-fib rate controlled at 100... currently  wearing zio patch. S/p ablation 02/08/19  No longer on flecanide Continue Eliquis 5 mg BID Continue diltiazem 240 mg daily with 30 mg PRN q 4 hours for heart racing.  Elevated Cholesterol:  At goal on atorvastatin 10 mg daily last year.. due for re-evaluation. Lab Results  Component Value Date   CHOL 162 10/01/2022   HDL 69.30 10/01/2022   LDLCALC 67 10/01/2022   LDLDIRECT 65.0 06/23/2018   TRIG 127.0 10/01/2022    CHOLHDL 2 10/01/2022  Using medications without problems: None Muscle aches:  Diet compliance: heart healthy diet Exercise:Walking, but has stopped lately. Other complaints:   Prediabetes  due for re-eval. Lab Results  Component Value Date   HGBA1C 5.6 10/01/2022        Relevant past medical, surgical, family and social history reviewed and updated as indicated. Interim medical history since our last visit reviewed. Allergies and medications reviewed and updated. Outpatient Medications Prior to Visit  Medication Sig Dispense Refill   alendronate (FOSAMAX) 35 MG tablet Take 35 mg by mouth every Tuesday. Take with a full glass of water on an empty stomach.     apixaban (ELIQUIS) 2.5 MG TABS tablet TAKE 1 TABLET TWICE DAILY 180 tablet 1   atorvastatin (LIPITOR) 10 MG tablet TAKE 1 TABLET EVERY DAY 90 tablet 3   Cholecalciferol (D3 VITAMIN PO) Take 10 mcg by mouth 2 (two) times daily.     diltiazem (CARDIZEM CD) 300 MG 24 hr capsule Take 1 capsule (300 mg total) by mouth daily. 90 capsule 3   diltiazem (CARDIZEM) 30 MG tablet TAKE 1 TABLET EVERY 4 HOURS AS NEEDED FOR HEART RATE OVER 100 90 tablet 2   lisinopril-hydrochlorothiazide (ZESTORETIC) 10-12.5 MG tablet TAKE 1 TABLET EVERY DAY 90 tablet 3   Multiple Vitamins-Minerals (MULTIVITAMIN WITH MINERALS) tablet Take 1 tablet by mouth daily  with supper.      Omega-3 Fatty Acids (FISH OIL) 1200 MG CAPS Take 1,200 mg by mouth daily with supper.      Polyethyl Glycol-Propyl Glycol 0.4-0.3 % SOLN Place 1 drop into both eyes 3 (three) times daily as needed (for dry/irritated eyes.).     sodium chloride (OCEAN) 0.65 % SOLN nasal spray Place 1 spray into both nostrils 4 (four) times daily as needed for congestion (allergies.).      No facility-administered medications prior to visit.     Per HPI unless specifically indicated in ROS section below Review of Systems  Constitutional:  Negative for fatigue and fever.  HENT:  Negative for  congestion.   Eyes:  Negative for pain.  Respiratory:  Negative for cough and shortness of breath.   Cardiovascular:  Negative for chest pain, palpitations and leg swelling.  Gastrointestinal:  Negative for abdominal pain.  Genitourinary:  Negative for dysuria and vaginal bleeding.  Musculoskeletal:  Negative for back pain.  Neurological:  Negative for syncope, light-headedness and headaches.  Psychiatric/Behavioral:  Negative for dysphoric mood.    Objective:  BP 120/80 (BP Location: Left Arm, Patient Position: Sitting, Cuff Size: Normal)   Pulse (!) 56   Temp 98.4 F (36.9 C) (Temporal)   Ht 5' (1.524 m)   Wt 114 lb 4 oz (51.8 kg)   SpO2 97%   BMI 22.31 kg/m   Wt Readings from Last 3 Encounters:  11/26/23 114 lb 4 oz (51.8 kg)  11/18/23 115 lb (52.2 kg)  11/06/23 115 lb 9.6 oz (52.4 kg)      Physical Exam Vitals and nursing note reviewed.  Constitutional:      General: She is not in acute distress.    Appearance: Normal appearance. She is well-developed. She is not ill-appearing or toxic-appearing.  HENT:     Head: Normocephalic.     Right Ear: Hearing, tympanic membrane, ear canal and external ear normal.     Left Ear: Hearing, tympanic membrane, ear canal and external ear normal.     Nose: Nose normal.  Eyes:     General: Lids are normal. Lids are everted, no foreign bodies appreciated.     Conjunctiva/sclera: Conjunctivae normal.     Pupils: Pupils are equal, round, and reactive to light.  Neck:     Thyroid: No thyroid mass or thyromegaly.     Vascular: No carotid bruit.     Trachea: Trachea normal.  Cardiovascular:     Rate and Rhythm: Normal rate and regular rhythm.     Heart sounds: Normal heart sounds, S1 normal and S2 normal. No murmur heard.    No gallop.  Pulmonary:     Effort: Pulmonary effort is normal. No respiratory distress.     Breath sounds: Normal breath sounds. No wheezing, rhonchi or rales.  Abdominal:     General: Bowel sounds are normal.  There is no distension or abdominal bruit.     Palpations: Abdomen is soft. There is no fluid wave or mass.     Tenderness: There is no abdominal tenderness. There is no guarding or rebound.     Hernia: No hernia is present.  Musculoskeletal:     Cervical back: Normal range of motion and neck supple.  Lymphadenopathy:     Cervical: No cervical adenopathy.  Skin:    General: Skin is warm and dry.     Findings: No rash.     Comments: Multiple Aks on arms... encouraged return to Dermatology  Neurological:     Mental Status: She is alert.     Cranial Nerves: No cranial nerve deficit.     Sensory: No sensory deficit.  Psychiatric:        Mood and Affect: Mood is not anxious or depressed.        Speech: Speech normal.        Behavior: Behavior normal. Behavior is cooperative.        Judgment: Judgment normal.       Results for orders placed or performed in visit on 05/04/23  Basic metabolic panel   Collection Time: 05/04/23  9:57 AM  Result Value Ref Range   Glucose 96 70 - 99 mg/dL   BUN 19 8 - 27 mg/dL   Creatinine, Ser 1.61 0.57 - 1.00 mg/dL   eGFR 77 >09 UE/AVW/0.98   BUN/Creatinine Ratio 25 12 - 28   Sodium 139 134 - 144 mmol/L   Potassium 3.5 3.5 - 5.2 mmol/L   Chloride 100 96 - 106 mmol/L   CO2 27 20 - 29 mmol/L   Calcium 9.8 8.7 - 10.3 mg/dL  CBC   Collection Time: 05/04/23  9:57 AM  Result Value Ref Range   WBC 5.8 3.4 - 10.8 x10E3/uL   RBC 4.32 3.77 - 5.28 x10E6/uL   Hemoglobin 13.4 11.1 - 15.9 g/dL   Hematocrit 11.9 14.7 - 46.6 %   MCV 91 79 - 97 fL   MCH 31.0 26.6 - 33.0 pg   MCHC 34.0 31.5 - 35.7 g/dL   RDW 82.9 56.2 - 13.0 %   Platelets 256 150 - 450 x10E3/uL    This visit occurred during the SARS-CoV-2 public health emergency.  Safety protocols were in place, including screening questions prior to the visit, additional usage of staff PPE, and extensive cleaning of exam room while observing appropriate contact time as indicated for disinfecting solutions.    COVID 19 screen:  No recent travel or known exposure to COVID19 The patient denies respiratory symptoms of COVID 19 at this time. The importance of social distancing was discussed today.   Assessment and Plan   The patient's preventative maintenance and recommended screening tests for an annual wellness exam were reviewed in full today. Brought up to date unless services declined.  Counselled on the importance of diet, exercise, and its role in overall health and mortality. The patient's FH and SH was reviewed, including their home life, tobacco status, and drug and alcohol status.   Vaccines: Uptodate, not interested in shingles vaccine. COVID vaccine x 6, per pt she has had flu vaccine Mammo: nml 07/2021 per pt, Plans q2 years. Mother breast cancer.   Done at GYN. Colon: Dr Madilyn Fireman, 2016 nml, mother colon cancer, 07/2020 no polyps DEXA: worsened osteopenia 2019. Now on fosamx 2019 per GYN.. . No record of bone density since 2019, may have been done at gynecology.   PAP/DVE :  PAP not indicated, no family hx of uterine ovarian cancer.  No concerns. No DVE indicated. Seeing GYN Dr, Velvet Bathe.. per pt report they did one 2022.  Non smoker     Problem List Items Addressed This Visit     Bradycardia   Chronic, asymptomatic,  Now on higher dose diltiazem 300 mg daily      Essential hypertension   Stable, chronic.  Continue current medication.   Good control on lisinopril HCTZ,  and diltiazem      History of anemia   Relevant Orders   CBC with Differential/Platelet  Hypercholesterolemia   Stable in past, chronic.  Continue current medication.  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  On Eliquis.      Osteopenia   ON fosamax per GYN, but it appears she started this in 2019.  She has completed her 4 to 5-year course.  Will   get records from GYN      Paroxysmal atrial fibrillation (HCC)    Rate controlled s/p ablation. Now off flecainide.  On diltiazem 300 mg daily for  rate control.  on longterm eliquis.      Prediabetes   Relevant Orders   Hemoglobin A1c   Other Visit Diagnoses       Routine general medical examination at a health care facility    -  Primary       Kerby Nora, MD

## 2023-11-27 ENCOUNTER — Other Ambulatory Visit: Payer: Self-pay | Admitting: Family Medicine

## 2023-12-03 ENCOUNTER — Other Ambulatory Visit (INDEPENDENT_AMBULATORY_CARE_PROVIDER_SITE_OTHER)

## 2023-12-03 DIAGNOSIS — E7800 Pure hypercholesterolemia, unspecified: Secondary | ICD-10-CM

## 2023-12-03 DIAGNOSIS — Z862 Personal history of diseases of the blood and blood-forming organs and certain disorders involving the immune mechanism: Secondary | ICD-10-CM | POA: Diagnosis not present

## 2023-12-03 DIAGNOSIS — R7303 Prediabetes: Secondary | ICD-10-CM | POA: Diagnosis not present

## 2023-12-03 LAB — COMPREHENSIVE METABOLIC PANEL
ALT: 32 U/L (ref 0–35)
AST: 33 U/L (ref 0–37)
Albumin: 4.4 g/dL (ref 3.5–5.2)
Alkaline Phosphatase: 102 U/L (ref 39–117)
BUN: 17 mg/dL (ref 6–23)
CO2: 31 meq/L (ref 19–32)
Calcium: 9.9 mg/dL (ref 8.4–10.5)
Chloride: 101 meq/L (ref 96–112)
Creatinine, Ser: 0.83 mg/dL (ref 0.40–1.20)
GFR: 65.26 mL/min (ref >60.00)
Glucose, Bld: 121 mg/dL — ABNORMAL HIGH (ref 70–99)
Potassium: 3.6 meq/L (ref 3.5–5.1)
Sodium: 138 meq/L (ref 135–145)
Total Bilirubin: 0.8 mg/dL (ref 0.2–1.2)
Total Protein: 7.5 g/dL (ref 6.0–8.3)

## 2023-12-03 LAB — CBC WITH DIFFERENTIAL/PLATELET
Basophils Absolute: 0 10*3/uL (ref 0.0–0.1)
Basophils Relative: 0.6 % (ref 0.0–3.0)
Eosinophils Absolute: 0.1 10*3/uL (ref 0.0–0.7)
Eosinophils Relative: 2.4 % (ref 0.0–5.0)
HCT: 43.7 % (ref 36.0–46.0)
Hemoglobin: 14.6 g/dL (ref 12.0–15.0)
Lymphocytes Relative: 27.9 % (ref 12.0–46.0)
Lymphs Abs: 1.5 10*3/uL (ref 0.7–4.0)
MCHC: 33.5 g/dL (ref 30.0–36.0)
MCV: 93.4 fl (ref 78.0–100.0)
Monocytes Absolute: 0.4 10*3/uL (ref 0.1–1.0)
Monocytes Relative: 8.3 % (ref 3.0–12.0)
Neutro Abs: 3.2 10*3/uL (ref 1.4–7.7)
Neutrophils Relative %: 60.8 % (ref 43.0–77.0)
Platelets: 237 10*3/uL (ref 150.0–400.0)
RBC: 4.68 Mil/uL (ref 3.87–5.11)
RDW: 13.7 % (ref 11.5–15.5)
WBC: 5.3 10*3/uL (ref 4.0–10.5)

## 2023-12-03 LAB — LIPID PANEL
Cholesterol: 166 mg/dL (ref 0–200)
HDL: 72.4 mg/dL (ref >39.00)
LDL Cholesterol: 68 mg/dL (ref 0–99)
NonHDL: 93.5
Total CHOL/HDL Ratio: 2
Triglycerides: 128 mg/dL (ref 0.0–149.0)
VLDL: 25.6 mg/dL (ref 0.0–40.0)

## 2023-12-03 LAB — HEMOGLOBIN A1C: Hgb A1c MFr Bld: 5.6 % (ref 4.6–6.5)

## 2023-12-04 ENCOUNTER — Encounter: Payer: Self-pay | Admitting: Family Medicine

## 2023-12-08 DIAGNOSIS — I480 Paroxysmal atrial fibrillation: Secondary | ICD-10-CM | POA: Diagnosis not present

## 2023-12-08 NOTE — Progress Notes (Signed)
No need for additional labs

## 2023-12-09 ENCOUNTER — Other Ambulatory Visit

## 2023-12-09 ENCOUNTER — Other Ambulatory Visit: Payer: Self-pay | Admitting: Student

## 2023-12-09 DIAGNOSIS — I480 Paroxysmal atrial fibrillation: Secondary | ICD-10-CM

## 2023-12-09 NOTE — Telephone Encounter (Signed)
 Prescription refill request for Eliquis received. Indication: Afib  Last office visit: 11/06/23 Alexandria Mcdowell)  Scr: 0.83 (12/03/23)  Age: 83 Weight: 51.8kg  Appropriate dose. Refill sent.

## 2023-12-24 DIAGNOSIS — Z0100 Encounter for examination of eyes and vision without abnormal findings: Secondary | ICD-10-CM | POA: Diagnosis not present

## 2023-12-24 DIAGNOSIS — H2513 Age-related nuclear cataract, bilateral: Secondary | ICD-10-CM | POA: Diagnosis not present

## 2024-01-06 ENCOUNTER — Telehealth: Payer: Self-pay | Admitting: Family Medicine

## 2024-01-06 DIAGNOSIS — L989 Disorder of the skin and subcutaneous tissue, unspecified: Secondary | ICD-10-CM

## 2024-01-06 NOTE — Telephone Encounter (Signed)
 Copied from CRM [id number]. Topic: General - Other >> Jan 06, 2024  2:29 PM Jenice Mitts wrote: Reason for CRM: Patient is calling because she would like a call back for recommended dermatologists in the area she could get a referral for.

## 2024-01-06 NOTE — Telephone Encounter (Signed)
 Spoke with [name] to find out the reason for the requested dermatology referral.  She states she has a spot on her leg that Dr. Cherlyn Cornet had recommended she get looked at due to her history of skin cancer.

## 2024-01-06 NOTE — Progress Notes (Signed)
 Electrophysiology Office Note:   Date:  01/08/2024  ID:  Alexandria Mcdowell, DOB July 02, 1941, MRN [id number]  Primary Cardiologist: Janelle Mediate, MD Electrophysiologist: Ardeen Kohler, MD      History of Present Illness:   Alexandria Mcdowell is a 83 y.o. female with h/o hypertension and paroxysmal atrial fibrillation who is being seen today for follow-up evaluation.  Discussed the use of AI scribe software for clinical note transcription with the patient, who gave verbal consent to proceed.  History of Present Illness Alexandria Mcdowell is an 83 year old female with atrial fibrillation who presents for follow-up of her condition. She has a history of atrial fibrillation and underwent an ablation procedure several years ago. She is unsure of the exact timeline but recalls having atrial fibrillation before the procedure and experiencing episodes afterward. She was previously on flecainide  and is currently taking diltiazem  daily as well as an additional 30 mg as needed for stress-related symptoms, which she describes as feeling 'stressed out'. Two months ago, she was noted to be in atrial fibrillation during a clinic visit. At that time, she was experiencing significant life stressors, including caring for her husband who has kidney problems and memory issues. She was not interested in pursuing other options at that time. She is still quite concerned about caring for her husband. She denies feeling limited in her daily activities and does not experience increased fatigue or shortness of breath. She is also on Eliquis  to prevent stroke due to her atrial fibrillation. No swelling in her legs and her breathing is fine at home.   Review of systems complete and found to be negative unless listed in HPI.   EP Information / Studies Reviewed:    EKG is ordered today. Personal review as below.  EKG Interpretation Date/Time:  Thursday January 07 2024 11:26:13 EDT Ventricular Rate:  81 PR Interval:    QRS  Duration:  70 QT Interval:  358 QTC Calculation: 415 R Axis:   63  Text Interpretation: Atrial fibrillation When compared with ECG of 06-Nov-2023 11:01,no significant change. Confirmed by Ardeen Kohler [id number]) on 01/07/2024 11:41:25 AM   Zio 12/08/23:  Patch Wear Time:  13 days and 23 hours (2025-02-26T16:24:17-0500 to 2025-03-12T17:24:05-399)   HR 51 - 192, average 93 bpm. 1 nonsustained VT (longest 7 beats) Atrial fibrillation was continuous throughout the course of the monitor, 100% burden.  No supraventricular ectopy. Rare ventricular ectopy. Symptom trigger episodes correspond to AF with rapid ventricular rates.  Echo 06/03/21:   1. Left ventricular ejection fraction, by estimation, is 65 to 70%. The  left ventricle has normal function. The left ventricle has no regional  wall motion abnormalities. Left ventricular diastolic parameters were  normal.   2. Right ventricular systolic function is normal. The right ventricular  size is normal.   3. The mitral valve is normal in structure. Mild mitral valve  regurgitation.   4. The aortic valve is normal in structure. Aortic valve regurgitation is  not visualized.    Risk Assessment/Calculations:    CHA2DS2-VASc Score = 4   This indicates a 4.8% annual risk of stroke. The patient's score is based upon: CHF History: 0 HTN History: 1 Diabetes History: 0 Stroke History: 0 Vascular Disease History: 0 Age Score: 2 Gender Score: 1            Physical Exam:   VS:  BP 100/64   Pulse 81   Ht 5\' 1"  (1.549 m)   Wt 114 lb (51.7 kg)  SpO2 97%   BMI 21.54 kg/m    Wt Readings from Last 3 Encounters:  01/07/24 114 lb (51.7 kg)  11/26/23 114 lb 4 oz (51.8 kg)  11/18/23 115 lb (52.2 kg)     GEN: Well nourished, well developed in no acute distress NECK: No JVD CARDIAC: Normal rate, irregular RESPIRATORY:  Clear to auscultation without rales, wheezing or rhonchi  ABDOMEN: Soft, non-distended EXTREMITIES:  No edema; No  deformity   ASSESSMENT AND PLAN:    #. Persistent atrial fibrillation: S/p ablation with Dr. Nunzio Belch on 02/08/19. Previously on flecainide , but stopped 07/2020.  Appears to be minimally symptomatic. - Patient is not interested in pursuing rhythm control strategies.  She is minimally symptomatic and can perform all her daily activities without issue.  Will continue with a rate control strategy.  Continue diltiazem  300 mg once daily.  She will sometimes take an additional 30 mg as needed.  I instructed her not to take more than 360 mg total in a 24-hour period.  #. Secondary hypercoagulable state due to atrial fibrillation: CHA2DS2-VASc score of at least 4. -Continue Eliquis  2.5 mg twice daily.  #Hypertension -At goal today.  Recommend checking blood pressures 1-2 times per week at home and recording the values.  Recommend bringing these recordings to the primary care physician.  Follow up with EP APP in 6 months and annually thereafter, if no changes.  Signed, Ardeen Kohler, MD

## 2024-01-07 ENCOUNTER — Encounter: Payer: Self-pay | Admitting: Cardiology

## 2024-01-07 ENCOUNTER — Ambulatory Visit: Attending: Cardiology | Admitting: Cardiology

## 2024-01-07 VITALS — BP 100/64 | HR 81 | Ht 61.0 in | Wt 114.0 lb

## 2024-01-07 DIAGNOSIS — I10 Essential (primary) hypertension: Secondary | ICD-10-CM | POA: Diagnosis not present

## 2024-01-07 DIAGNOSIS — D6869 Other thrombophilia: Secondary | ICD-10-CM

## 2024-01-07 DIAGNOSIS — I480 Paroxysmal atrial fibrillation: Secondary | ICD-10-CM

## 2024-01-07 NOTE — Telephone Encounter (Signed)
 [place] Location Preferable.

## 2024-01-07 NOTE — Addendum Note (Signed)
 Addended by: Herby Lolling E on: 01/07/2024 03:52 PM   Modules accepted: Orders

## 2024-01-07 NOTE — Telephone Encounter (Signed)
 did she have preference, [place] or [employer]?

## 2024-01-07 NOTE — Patient Instructions (Signed)
 Medication Instructions:  Your physician recommends that you continue on your current medications as directed. Please refer to the Current Medication list given to you today.  *If you need a refill on your cardiac medications before your next appointment, please call your pharmacy*  Follow-Up: At [facility], you and your health needs are our priority.  As part of our continuing mission to provide you with exceptional heart care, our providers are all part of one team.  This team includes your primary Cardiologist (physician) and Advanced Practice Providers or APPs (Physician Assistants and Nurse Practitioners) who all work together to provide you with the care you need, when you need it.  Your next appointment:   1 year  Provider:   You will see one of the following Advanced Practice Providers on your designated Care Team:   Francis Dowse, [place] Casimiro Needle "Mardelle Matte" Wagoner, PA-C Sherie Don, NP Canary Brim, NP       1st Floor: - Lobby - Registration  - Pharmacy  - Lab - Cafe  2nd Floor: - PV Lab - Diagnostic Testing (echo, CT, nuclear med)  3rd Floor: - Vacant  4th Floor: - TCTS (cardiothoracic surgery) - AFib Clinic - Structural Heart Clinic - Vascular Surgery  - Vascular Ultrasound  5th Floor: - HeartCare Cardiology (general and EP) - Clinical Pharmacy for coumadin, hypertension, lipid, weight-loss medications, and med management appointments    Valet parking services will be available as well.

## 2024-01-15 ENCOUNTER — Other Ambulatory Visit: Payer: Self-pay | Admitting: *Deleted

## 2024-01-15 MED ORDER — LISINOPRIL-HYDROCHLOROTHIAZIDE 10-12.5 MG PO TABS: 1.0000 | ORAL_TABLET | Freq: Every day | ORAL | 3 refills | Status: AC

## 2024-01-15 NOTE — Telephone Encounter (Signed)
 Refill sent as requested.

## 2024-01-15 NOTE — Telephone Encounter (Signed)
 Copied from CRM [id number]. Topic: Clinical - Medication Refill >> Jan 15, 2024  9:54 AM Marcy Sexton wrote: Most Recent Primary Care Visit:  Provider: [facility]  Department: [facility]  Visit Type: LAB VISIT  Date: 12/03/2023  Medication: lisinopril -hydrochlorothiazide  (ZESTORETIC ) 10-12.5 MG tablet   Has the patient contacted their pharmacy? Yes (Agent: If no, request that the patient contact the pharmacy for the refill. If patient does not wish to contact the pharmacy document the reason why and proceed with request.) (Agent: If yes, when and what did the pharmacy advise?)  Is this the correct pharmacy for this prescription? Yes If no, delete pharmacy and type the correct one.  This is the patient's preferred pharmacy:   [facility] - [place], [place] - [address] [place] [address] Phone: [phone] Fax: [phone]   Has the prescription been filled recently? Yes  Is the patient out of the medication? Yes  Has the patient been seen for an appointment in the last year OR does the patient have an upcoming appointment? Yes  Can we respond through MyChart? Yes  Agent: Please be advised that Rx refills may take up to 3 business days. We ask that you follow-up with your pharmacy.

## 2024-02-22 ENCOUNTER — Other Ambulatory Visit: Payer: Self-pay | Admitting: Family Medicine

## 2024-05-17 ENCOUNTER — Ambulatory Visit: Admitting: Dermatology

## 2024-05-18 ENCOUNTER — Other Ambulatory Visit: Payer: Self-pay | Admitting: Cardiovascular Disease

## 2024-05-18 DIAGNOSIS — I480 Paroxysmal atrial fibrillation: Secondary | ICD-10-CM

## 2024-05-18 NOTE — Telephone Encounter (Signed)
Refills request.

## 2024-05-19 NOTE — Telephone Encounter (Signed)
 Prescription refill request for Eliquis  received. Indication: a fib Last office visit: 12/18/23 Scr: 0.83 12/03/23 epic Age: 83 Weight: 51kg

## 2024-06-15 ENCOUNTER — Other Ambulatory Visit: Payer: Self-pay | Admitting: Student

## 2024-06-20 ENCOUNTER — Other Ambulatory Visit: Payer: Self-pay

## 2024-06-22 ENCOUNTER — Other Ambulatory Visit: Payer: Self-pay

## 2024-06-22 MED ORDER — DILTIAZEM HCL ER COATED BEADS 300 MG PO CP24: 300.0000 mg | ORAL_CAPSULE | Freq: Every day | ORAL | 1 refills | Status: AC

## 2024-07-07 ENCOUNTER — Ambulatory Visit: Admitting: Dermatology

## 2024-07-07 ENCOUNTER — Encounter: Payer: Self-pay | Admitting: Dermatology

## 2024-07-07 VITALS — BP 126/70 | HR 89

## 2024-07-07 DIAGNOSIS — D485 Neoplasm of uncertain behavior of skin: Secondary | ICD-10-CM | POA: Diagnosis not present

## 2024-07-07 DIAGNOSIS — C4491 Basal cell carcinoma of skin, unspecified: Secondary | ICD-10-CM

## 2024-07-07 DIAGNOSIS — C44719 Basal cell carcinoma of skin of left lower limb, including hip: Secondary | ICD-10-CM

## 2024-07-07 DIAGNOSIS — Z8589 Personal history of malignant neoplasm of other organs and systems: Secondary | ICD-10-CM | POA: Diagnosis not present

## 2024-07-07 HISTORY — DX: Basal cell carcinoma of skin, unspecified: C44.91

## 2024-07-07 NOTE — Progress Notes (Unsigned)
   New Patient Visit   Subjective  Alexandria Mcdowell is a 83 y.o. female who presents for the following: growth  Pt has a growth on left lower leg that has been present for some time and isn't healing. She has hx of multiple SCC. She has not had this lesion treated previously.   The following portions of the chart were reviewed this encounter and updated as appropriate: medications, allergies, medical history  Review of Systems:  No other skin or systemic complaints except as noted in HPI or Assessment and Plan.  Objective  Well appearing patient in no apparent distress; mood and affect are within normal limits.  A focused examination was performed of the following areas: Left leg  Relevant exam findings are noted in the Assessment and Plan.  Left Lower Leg 3.5 cm fungating red plaque   Assessment & Plan    HISTORY OF SQUAMOUS CELL CARCINOMA OF THE SKIN - Recommend regular full body skin exams - Recommend daily broad spectrum sunscreen SPF 30+ to sun-exposed areas, reapply every 2 hours as needed.  - Call if any new or changing lesions are noted between office visits  NEOPLASM OF UNCERTAIN BEHAVIOR OF SKIN Left Lower Leg Skin / nail biopsy Type of biopsy: tangential   Informed consent: discussed and consent obtained   Timeout: patient name, date of birth, surgical site, and procedure verified   Procedure prep:  Patient was prepped and draped in usual sterile fashion Prep type:  Isopropyl alcohol Anesthesia: the lesion was anesthetized in a standard fashion   Anesthetic:  1% lidocaine  w/ epinephrine 1-100,000 buffered w/ 8.4% NaHCO3 Instrument used: DermaBlade   Hemostasis achieved with: electrodesiccation   Outcome: patient tolerated procedure well   Post-procedure details: sterile dressing applied and wound care instructions given   Dressing type: bandage and pressure dressing    Specimen 1 - Surgical pathology Differential Diagnosis: R/O NMSC  Check Margins:  No  Return for TBSE with brenda, mohs tier 3 left lower legSCC.  I, [name], CMA, am acting as scribe for [name], MD.   Documentation: I have reviewed the above documentation for accuracy and completeness, and I agree with the above.  [name], MD

## 2024-07-07 NOTE — Patient Instructions (Addendum)

## 2024-07-08 LAB — SURGICAL PATHOLOGY

## 2024-07-10 ENCOUNTER — Ambulatory Visit: Admission: EM | Admit: 2024-07-10 | Discharge: 2024-07-10 | Disposition: A | Discharge status: Home / Self Care

## 2024-07-10 DIAGNOSIS — L03115 Cellulitis of right lower limb: Secondary | ICD-10-CM | POA: Diagnosis not present

## 2024-07-10 DIAGNOSIS — Z4889 Encounter for other specified surgical aftercare: Secondary | ICD-10-CM | POA: Diagnosis not present

## 2024-07-10 MED ORDER — CEPHALEXIN 500 MG PO CAPS: 500.0000 mg | ORAL_CAPSULE | Freq: Three times a day (TID) | ORAL | 0 refills | Status: AC

## 2024-07-10 MED ORDER — BACITRACIN ZINC 500 UNIT/GM EX OINT
TOPICAL_OINTMENT | Freq: Once | CUTANEOUS | Status: AC
  Administered 2024-07-10: 1 via TOPICAL

## 2024-07-10 NOTE — ED Provider Notes (Signed)
 [facility]  Note:  This document was prepared using Conservation officer, historic buildings and may include unintentional dictation errors.  MRN: [id number] DOB: 1940/10/26  Subjective:   Alexandria Mcdowell is a 83 y.o. female presenting for evaluation of post procedure wound to right lower leg after skin biopsy for basal cell carcinoma performed on Thursday by her dermatologist.  Patient reports that bandage came off after trying to Rie bandage patient was concerned for mucus discharge that was on the bandage and wanted evaluation in urgent care to make sure that everything is okay.  Patient also requesting a prebandaging in urgent care.  Patient denies any fever, significant bleeding, increased pain, increased swelling, severe purulent drainage.  No current facility-administered medications for this encounter.  Current Outpatient Medications:    apixaban  (ELIQUIS ) 2.5 MG TABS tablet, TAKE 1 TABLET TWICE DAILY, Disp: 180 tablet, Rfl: 1   cephALEXin  (KEFLEX ) 500 MG capsule, Take 1 capsule (500 mg total) by mouth 3 (three) times daily for 7 days., Disp: 21 capsule, Rfl: 0   diltiazem  (CARDIZEM ) 30 MG tablet, TAKE 1 TABLET EVERY 4 HOURS AS NEEDED FOR HEART RATE OVER 100, Disp: 90 tablet, Rfl: 2   lisinopril -hydrochlorothiazide  (ZESTORETIC ) 10-12.5 MG tablet, Take 1 tablet by mouth daily., Disp: 90 tablet, Rfl: 3   alendronate (FOSAMAX) 35 MG tablet, Take 35 mg by mouth every Tuesday. Take with a full glass of water on an empty stomach., Disp: , Rfl:    atenolol  (TENORMIN ) 100 MG tablet, 1 tablet Orally Once a day; Duration: 30 day(s), Disp: , Rfl:    atorvastatin  (LIPITOR) 10 MG tablet, TAKE 1 TABLET EVERY DAY, Disp: 90 tablet, Rfl: 3   Cholecalciferol (D3 VITAMIN PO), Take 10 mcg by mouth 2 (two) times daily., Disp: , Rfl:    diltiazem  (CARDIZEM  CD) 300 MG 24 hr capsule, Take 1 capsule (300 mg total) by mouth daily., Disp: 90 capsule, Rfl: 1   diltiazem  (TIAZAC ) 240 MG 24 hr capsule, 1  capsule., Disp: , Rfl:    Multiple Vitamins-Minerals (MULTIVITAMIN WITH MINERALS) tablet, Take 1 tablet by mouth daily with supper. , Disp: , Rfl:    Omega-3 Fatty Acids (FISH OIL) 1200 MG CAPS, Take 1,200 mg by mouth daily with supper. , Disp: , Rfl:    Polyethyl Glycol-Propyl Glycol 0.4-0.3 % SOLN, Place 1 drop into both eyes 3 (three) times daily as needed (for dry/irritated eyes.)., Disp: , Rfl:    sodium chloride  (OCEAN) 0.65 % SOLN nasal spray, Place 1 spray into both nostrils 4 (four) times daily as needed for congestion (allergies.). , Disp: , Rfl:    VITAMIN D  PO, 1 capsule., Disp: , Rfl:    Allergies  Allergen Reactions   Clindamycin Hcl Nausea And Vomiting    nervous   Codeine Nausea And Vomiting   Erythromycin Nausea And Vomiting   Tetracycline Nausea And Vomiting   Penicillins Rash    Did it involve swelling of the face/tongue/throat, SOB, or low BP? No Did it involve sudden or severe rash/hives, skin peeling, or any reaction on the inside of your mouth or nose? Yes Did you need to seek medical attention at a hospital or doctor's office? Yes When did it last happen?     childhood  If all above answers are "NO", may proceed with cephalosporin use.    Sulfonamide Derivatives Nausea And Vomiting and Rash    Past Medical History:  Diagnosis Date   Allergy    Anemia    Atypical  mole 10/20/2001   Left Mid to Upper Back-Slight to Moderate(no treatment)   BCC (basal cell carcinoma of skin) 02/07/2004   Left Shin(excision) Dr. 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 Helga   SCCA (squamous cell carcinoma) of skin 08/03/2000   Left Shin([place]) Dr. Helga    SCCA (squamous cell carcinoma) of skin 08/03/2000   Right Shin([place]) Dr. Helga   SCCA (squamous cell carcinoma) of skin 09/17/2000   Right Forearm([place]) Dr. Georgena   SCCA (squamous cell carcinoma) of skin 09/17/2000   Right Lower Leg([place]) Dr. Georgena   SCCA (squamous cell carcinoma) of skin 09/17/2000   Left Upper Lower Leg,Lower Knee([place]) Dr. Georgena   SCCA (squamous cell carcinoma) of skin 09/17/2000   Left Lower Medial Leg([place]) Dr. Georgena   SCCA (squamous cell carcinoma) of skin 10/13/2000   Left Medial Ankle([place]) Dr. Georgena (excision)   SCCA (squamous cell carcinoma) of skin 10/13/2000   Right Lateral Ankle([place]) Dr. Georgena (excision)   SCCA (squamous cell carcinoma) of skin 12/03/2000   Left Upper Arm([place]) Dr. Georgena   SCCA (squamous cell carcinoma) of skin 12/03/2000   Left Med. Lower Leg([place]) Dr. Georgena (excision)   SCCA (squamous cell carcinoma) of skin 12/11/2000   Left Upper Thigh([place]) Dr. Georgena   SCCA (squamous cell carcinoma) of skin 12/15/2000   Left Thigh([place]) Dr. Georgena   SCCA (squamous cell carcinoma) of skin 01/18/2001   Left Forearm(in Situ) Dr. Helga   SCCA (squamous cell carcinoma) of skin 01/18/2001   Left Hand 2nd/3rd map(in situ) Dr. Helga)   SCCA (squamous cell carcinoma) of skin 01/18/2001   Left Shin Lat(in situ) Dr. Helga   SCCA (squamous cell carcinoma) of skin 01/18/2001   Left Calf(in situ) Dr. Helga   SCCA (squamous cell carcinoma) of skin 10/20/2001   Right Ear Lobe([place]) Dr. Helga (exc)   SCCA (squamous cell carcinoma) of skin 02/02/2002   Left Lower Leg Post Sup(in situ) Dr. Helga   SCCA (squamous cell carcinoma) of skin 02/07/2004   Left Shin Lateral(in situ) Dr. Helga   SCCA (squamous cell carcinoma) of skin 04/03/2009   Left Post Shoulder(in situ) treatment  curet and 5FU   SCCA (squamous cell carcinoma) of skin 04/03/2009   Left Outer Thigh(in situ) treatment curet and 5FU   SCCA (squamous cell carcinoma) of skin 04/03/2009   Right Lower Leg(in situ) treatment curet and 5FU   SCCA (squamous cell carcinoma) of skin 07/08/2016   Left Inner Knee(in situ) treatment after biopsy   SCCA (squamous cell carcinoma) of skin 10/18/2019   Right Upper Arm - CX3 + 5FU   Superficial basal cell carcinoma (BCC) 02/19/2000   Left Forearm (Dr. Helga)     Past Surgical History:  Procedure Laterality Date   APPENDECTOMY     ATRIAL FIBRILLATION ABLATION N/A 02/08/2019   Procedure: ATRIAL FIBRILLATION ABLATION;  Surgeon: Kelsie Agent, MD;  Location: [facility]ASIVE [facility];  Service: Cardiovascular;  Laterality: N/A;   BREAST BIOPSY     CARDIOVERSION N/A 12/29/2018   Procedure: CARDIOVERSION;  Surgeon: Maranda Leim DEL, MD;  Location: [facility] ENDOSCOPY;  Service: Cardiovascular;  Laterality: N/A;   CATARACT EXTRACTION  05/05/13  right eye    COLONOSCOPY  2006   ECTOPIC PREGNANCY SURGERY  1970   WISDOM TOOTH EXTRACTION      Family History  Problem Relation Age of Onset   Hypertension Mother    Hyperlipidemia Mother    Cancer Mother        breast/colon   Heart disease Mother    Diabetes Father    Emphysema Father    Hyperlipidemia Sister    Hypertension Sister    Hypothyroidism Sister     Social History   Tobacco Use   Smoking status: Never   Smokeless tobacco: Never   Tobacco comments:    Never smoke 12/30/22  Vaping Use   Vaping status: Never Used  Substance Use Topics   Alcohol use: No   Drug use: No    ROS Refer to HPI for ROS details.  Objective:   Vitals: BP (!) 141/84 (BP Location: Left Arm) Comment: a little anxious about the wound and results  Pulse (!) 103   Temp 98.7 F (37.1 C) (Oral)   Resp 18   Ht 5' 1 (1.549 m)   Wt 113 lb 15.7 oz (51.7 kg)   SpO2 97%   BMI 21.54 kg/m   Physical Exam Vitals and nursing note  reviewed.  Constitutional:      General: She is not in acute distress.    Appearance: Normal appearance. She is not ill-appearing.  HENT:     Head: Normocephalic.  Cardiovascular:     Rate and Rhythm: Normal rate.  Pulmonary:     Effort: Pulmonary effort is normal. No respiratory distress.  Musculoskeletal:       Legs:  Skin:    General: Skin is warm and dry.     Capillary Refill: Capillary refill takes less than 2 seconds.     Findings: Erythema and wound present. No bruising.  Neurological:     General: No focal deficit present.     Mental Status: She is alert and oriented to person, place, and time.  Psychiatric:        Mood and Affect: Mood normal.        Behavior: Behavior normal.     Procedures  No results found for this or any previous visit (from the past 24 hours).  No results found.   Assessment and Plan :     Discharge Instructions       1. Encounter for post surgical wound check (Primary) 2. Cellulitis of right lower extremity - Apply dressing to right lower leg postbiopsy site with nonadherent gauze and Coban for wound protection and infection prevention - bacitracin ointment applied to wound for infection prevention and healing 8. - cephALEXin  (KEFLEX ) 500 MG capsule; Take 1 capsule (500 mg total) by mouth 3 (three) times daily for 7 days.  Dispense: 21 capsule; Refill: 0 - Follow-up with dermatologist on Monday for ongoing management post skin biopsy. -Continue to monitor symptoms for any change in severity if there is any escalation of current symptoms or development of new symptoms follow-up in ER for further evaluation and management.       Terrilyn Tyner B Jaeceon Michelin   Mahira Petras, [place] B, NP 07/10/24 [id number]

## 2024-07-10 NOTE — Discharge Instructions (Signed)
  1. Encounter for post surgical wound check (Primary) 2. Cellulitis of right lower extremity - Apply dressing to right lower leg postbiopsy site with nonadherent gauze and Coban for wound protection and infection prevention - bacitracin ointment applied to wound for infection prevention and healing 8. - cephALEXin  (KEFLEX ) 500 MG capsule; Take 1 capsule (500 mg total) by mouth 3 (three) times daily for 7 days.  Dispense: 21 capsule; Refill: 0 - Follow-up with dermatologist on Monday for ongoing management post skin biopsy. -Continue to monitor symptoms for any change in severity if there is any escalation of current symptoms or development of new symptoms follow-up in ER for further evaluation and management.

## 2024-07-10 NOTE — ED Triage Notes (Signed)
 Patient reports needing wound checked. Recent procedure on 07-07-2024 by K. Paci MD (office) as follows: Left Lower Leg Biopsy, Result of pathology (not notified of yet): BASAL CELL CARCINOMA, NODULAR AND INFILTRATIVE PATTERNS   Current symptoms: the bandage on left lower leg had came off and had to cover it with a new one, concerned with wanting to make sure it looks good. No fever.

## 2024-07-11 ENCOUNTER — Ambulatory Visit: Payer: Self-pay | Admitting: Dermatology

## 2024-07-16 DIAGNOSIS — M810 Age-related osteoporosis without current pathological fracture: Secondary | ICD-10-CM | POA: Diagnosis not present

## 2024-07-16 DIAGNOSIS — Z7901 Long term (current) use of anticoagulants: Secondary | ICD-10-CM | POA: Diagnosis not present

## 2024-07-16 DIAGNOSIS — D6869 Other thrombophilia: Secondary | ICD-10-CM | POA: Diagnosis not present

## 2024-07-16 DIAGNOSIS — Z823 Family history of stroke: Secondary | ICD-10-CM | POA: Diagnosis not present

## 2024-07-16 DIAGNOSIS — Z87891 Personal history of nicotine dependence: Secondary | ICD-10-CM | POA: Diagnosis not present

## 2024-07-16 DIAGNOSIS — E785 Hyperlipidemia, unspecified: Secondary | ICD-10-CM | POA: Diagnosis not present

## 2024-07-16 DIAGNOSIS — Z833 Family history of diabetes mellitus: Secondary | ICD-10-CM | POA: Diagnosis not present

## 2024-07-16 DIAGNOSIS — Z881 Allergy status to other antibiotic agents status: Secondary | ICD-10-CM | POA: Diagnosis not present

## 2024-07-16 DIAGNOSIS — I4891 Unspecified atrial fibrillation: Secondary | ICD-10-CM | POA: Diagnosis not present

## 2024-07-16 DIAGNOSIS — I10 Essential (primary) hypertension: Secondary | ICD-10-CM | POA: Diagnosis not present

## 2024-07-16 DIAGNOSIS — Z7983 Long term (current) use of bisphosphonates: Secondary | ICD-10-CM | POA: Diagnosis not present

## 2024-07-16 DIAGNOSIS — Z8249 Family history of ischemic heart disease and other diseases of the circulatory system: Secondary | ICD-10-CM | POA: Diagnosis not present

## 2024-08-09 ENCOUNTER — Encounter: Payer: Self-pay | Admitting: Dermatology

## 2024-08-09 ENCOUNTER — Ambulatory Visit: Admitting: Dermatology

## 2024-08-09 VITALS — BP 118/88 | HR 98 | Temp 97.8°F

## 2024-08-09 DIAGNOSIS — C44719 Basal cell carcinoma of skin of left lower limb, including hip: Secondary | ICD-10-CM | POA: Diagnosis not present

## 2024-08-09 DIAGNOSIS — D099 Carcinoma in situ, unspecified: Secondary | ICD-10-CM

## 2024-08-09 DIAGNOSIS — D0472 Carcinoma in situ of skin of left lower limb, including hip: Secondary | ICD-10-CM

## 2024-08-09 DIAGNOSIS — C4491 Basal cell carcinoma of skin, unspecified: Secondary | ICD-10-CM

## 2024-08-09 DIAGNOSIS — L579 Skin changes due to chronic exposure to nonionizing radiation, unspecified: Secondary | ICD-10-CM

## 2024-08-09 DIAGNOSIS — L814 Other melanin hyperpigmentation: Secondary | ICD-10-CM

## 2024-08-09 DIAGNOSIS — D485 Neoplasm of uncertain behavior of skin: Secondary | ICD-10-CM

## 2024-08-09 DIAGNOSIS — D049 Carcinoma in situ of skin, unspecified: Secondary | ICD-10-CM

## 2024-08-09 HISTORY — DX: Carcinoma in situ of skin, unspecified: D04.9

## 2024-08-09 MED ORDER — MUPIROCIN 2 % EX OINT: 1.0000 | TOPICAL_OINTMENT | Freq: Two times a day (BID) | CUTANEOUS | 3 refills | Status: AC

## 2024-08-09 MED ORDER — TRAMADOL HCL 50 MG PO TABS: 50.0000 mg | ORAL_TABLET | Freq: Four times a day (QID) | ORAL | 0 refills | Status: AC | PRN

## 2024-08-09 NOTE — Progress Notes (Signed)
 Follow-Up Visit   Subjective  Alexandria Mcdowell is a 83 y.o. 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 The lesion was biopsied on 07/07/2024. Patient states that the lesion has been present for almost a month and grew quickly. It is not painful. She believes she has had a skin cancer removed in that area before.  Patient is accompanied by her husband, Alexandria Mcdowell.   The following portions of the chart were reviewed this encounter and updated as appropriate: medications, allergies, medical history  Review of Systems:  No other skin or systemic complaints except as noted in HPI or Assessment and Plan.  Objective  Well appearing patient in no apparent distress; mood and affect are within normal limits.  A focused examination was performed of the following areas: Left lower leg Relevant physical exam findings are noted in the Assessment and Plan.   Left Lower Leg Large pink plaque     Assessment & Plan   BASAL CELL CARCINOMA (BCC), UNSPECIFIED SITE Left Lower Leg Mohs surgery  Consent obtained: written  Anticoagulation: Is the patient taking prescription anticoagulant and/or aspirin prescribed/recommended by a physician? Yes   Was the anticoagulation regimen changed prior to Mohs? No    Anesthesia: Anesthesia method: local infiltration Local anesthetic: lidocaine  1% WITH epi (23cc)  Procedure Details: Timeout: pre-procedure verification complete Procedure Prep: patient was prepped and draped in usual sterile fashion Prep type: chlorhexidine Biopsy accession number: [id number] Biopsy lab: [facility] Date of biopsy: 07/07/2024 Pre-Op diagnosis: basal cell carcinoma BCC subtype: infiltrative and nodular MohsAIQ Surgical site (if tumor spans multiple areas, please select predominant area): lower limb (including hip) Surgery side: left Surgical site (from skin exam): Left Lower  Leg Pre-operative length (cm): 3 Pre-operative width (cm): 3 Indications for Mohs surgery: anatomic location where tissue conservation is critical and tumor size greater than 2 cm Previously treated? No    Micrographic Surgery Details: Post-operative length (cm): 5 Post-operative width (cm): 4 Number of Mohs stages: 1 Post surgery depth of defect: subcutaneous fat  Stage 1    Tumor features identified on Mohs section: no tumor identified    Depth of tumor invasion after stage: subcutaneous fat  Patient tolerance of procedure: tolerated well, no immediate complications  Reconstruction: Was the defect reconstructed? Yes   Was reconstruction performed by the same Mohs surgeon? Yes   Setting of reconstruction: outpatient office When was reconstruction performed? same day Type of reconstruction: flap Type of flap: advancement   Other advancement flap type: Keystone Flap Flap area (cm2): 50 (12.5cm x 4.0 cm)  Opioids: Did the patient receive a prescription for opioid/narcotic related to Mohs surgery? Yes   Indications for opioid/narcotics: patient required additional pain relief despite trial of non-opioid analgesia  Antibiotics: Does patient meet AHA guidelines for endocarditis?: No   Does patient meet AHA guidelines for orthopedic prophylaxis?: No   Were antibiotics given on the day of surgery?: No   Did surgery breach mucosa, expose cartilage/bone, involve an area of lymphedema/inflamed/infected tissue? No    Related Medications traMADol  (ULTRAM ) 50 MG tablet Take 1 tablet (50 mg total) by mouth every 6 (six) hours as needed for up to 8 days. NEOPLASM OF UNCERTAIN BEHAVIOR OF SKIN Left Lower Leg - Anterior Skin / nail biopsy Type of biopsy: tangential   Informed consent: discussed and consent obtained   Timeout: patient name, date of birth, surgical site, and procedure verified   Procedure  prep:  Patient was prepped and draped in usual sterile fashion Prep type:   Isopropyl alcohol Anesthesia: the lesion was anesthetized in a standard fashion   Anesthetic:  1% lidocaine  w/ epinephrine 1-100,000 buffered w/ 8.4% NaHCO3 Instrument used: DermaBlade   Hemostasis achieved with: electrodesiccation   Outcome: patient tolerated procedure well   Post-procedure details: sterile dressing applied   Dressing type: petrolatum and bandage    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 Destruction of lesion Complexity: simple   Destruction method: electrodesiccation and curettage   Informed consent: discussed and consent obtained   Timeout:  patient name, date of birth, surgical site, and procedure verified Procedure prep:  Patient was prepped and draped in usual sterile fashion Prep type:  Chlorhexidine Anesthesia: the lesion was anesthetized in a standard fashion   Anesthetic:  1% lidocaine  w/ epinephrine 1-100,000 buffered w/ 8.4% NaHCO3 (1cc) Curettage performed in three different directions: Yes   Electrodesiccation performed over the curetted area: Yes   Curettage cycles:  3 Final wound size (cm):  1.5 Hemostasis achieved with:  electrodesiccation Outcome: patient tolerated procedure well with no complications   Post-procedure details: sterile dressing applied and wound care instructions given   Dressing type: bandage and pressure dressing     Neoplasm of Skin- 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 Hemostasis was achieved with direct pressure and electrocautery, and a  sterile dressing was applied. The patient tolerated the procedure well without immediate complications, and the findings were discussed with the patient, including the need for definitive management based on the frozen-section result.  Microscopic Description: Frozen section shows full-thickness atypia of the epidermis with disorderly keratinocyte maturation, nuclear pleomorphism, increased mitotic figures, and loss of normal polarity, without invasion into the underlying dermis.  Return for 12-14 day suture removal .  I, Rollene Gobble, RN, am acting as scribe for [name], MD .   08/09/2024  HISTORY OF PRESENT ILLNESS  Alexandria Mcdowell is seen in consultation at the request of Dr. Holy for biopsy-proven Nodular and Infiltrative Basal Cell Carcinoma on the left lower leg. They note that the area has been present for about 4 months increasing in size with time.  There is no history of previous treatment.  Reports no other new or changing lesions and has no other complaints today.  Medications and allergies: see patient chart.  Review of systems: Reviewed 8 systems and notable for the above skin cancer.  All other systems reviewed are unremarkable/negative, unless noted in the HPI. Past medical history, surgical history, family history, social history were also reviewed and are noted in the chart/questionnaire.    PHYSICAL EXAMINATION  General: Well-appearing, in no acute distress, alert and oriented x 4. Vitals reviewed in chart (if available).   Skin: Exam reveals a 3.0 x 3.0 cm erythematous papule and biopsy scar on the left lower leg. There are rhytids, telangiectasias, and lentigines, consistent with photodamage.  Biopsy report(s) reviewed, confirming the diagnosis.   ASSESSMENT  1) Nodular and Infiltrative Basal Cell Carcinoma on the left lower leg 2) photodamage 3) solar  lentigines   PLAN   1. Due to location, size, histology, or recurrence and the likelihood of  subclinical extension as well as the need to conserve normal surrounding tissue, the patient was deemed acceptable for Mohs micrographic surgery (MMS).  The nature and purpose of the procedure, associated benefits and risks including recurrence and scarring, possible complications such as pain, infection, and bleeding, and alternative methods of treatment if appropriate were discussed with the patient during consent. The lesion location was verified by the patient, by reviewing previous notes, pathology reports, and by photographs as well as angulation measurements if available.  Informed consent was reviewed and signed by the patient, and timeout was performed at 8:15 AM. See op note below.  2. For the photodamage and solar lentigines, sun protection discussed/information given on OTC sunscreens, and we recommend continued regular follow-up with primary dermatologist every 6 months or sooner for any growing, bleeding, or changing lesions. 3. Prognosis and future surveillance discussed. 4. Letter with treatment outcome sent to referring provider. 5. Pain acetaminophen /ibuprofen/tramadol  50 mg   MOHS MICROGRAPHIC SURGERY AND RECONSTRUCTION  Initial size:   3.0 x 3.0 cm Surgical defect/wound size: 5.0 x 4.0 cm Anesthesia:    0.33% lidocaine  with 1:200,000 epinephrine EBL:    <5 mL Complications:  None Repair type:   Adjacent Tissue Transfer (Keystone Flap) SQ suture:   3-0 PDS, 3-0 Vicryl  Cutaneous suture:  4-0 Polyprolene Final size of the repair: 12.5 x 4.0= 50 cm^2  Stages: 1  STAGE I: Anesthesia achieved with 0.5% lidocaine  with 1:200,000 epinephrine. ChloraPrep applied. 2 section(s) excised using Mohs technique (this includes total peripheral and deep tissue margin excision and evaluation with frozen sections, excised and interpreted by the same physician). The tumor was first debulked and then excised with an approx. 2mm margin.  Hemostasis was achieved with electrocautery as needed.  The  specimen was then oriented, subdivided/relaxed, inked, and processed using Mohs technique.    Frozen section analysis revealed a clear deep and peripheral margin.  Reconstruction  PROCEDURE: [facility] Flap The nature of the procedure was discussed with the patient in detail, including alternatives.  The risks discussed included but not limited to potential for infection, bleeding, scar formation, and damage to underlying structures.  The patient understood the risks and signed the consent form (scanned into chart).  This wound was reconstructed with a one-stage fasciocutaneous perforator island flap.  A template was designed in an arcuate or 'keystone' manner lateral to the defect, its diameter measuring the diameter of the defect.  The pedicle of the flap was designed to incorporate the underling fasciocutaneous perforating vessels.  Local anesthesia of the Mohs defect and adjacent flap was achieved with the anesthetic indicated above.  The operative site was prepped with a surgical antiseptic solution, and then draped with sterile towels to insure a sterile field.  The flap was then incised down to muscle fascia and careful blunt dissection of the periphery of the flap was performed at this level, making certain to preserve the integrity of the adjacent neurovascular bundles.  Once the pedicle was released from its periphery the secondary defect was widely undermined at this fascial level.  The island flap was then able to move relatively freely and a suspension suture was placed at the point of highest tension between the flap and the defect. Meticulous hemostasis was obtained with an electrosurgical device.  The flap was then sutured into place and the secondary defect was also repaired in  a layered fashion.    Sterile non-stick pressure dressings were applied to the surgical site, the wound care instruction handout was reviewed with the patient, and appropriate  follow-up care was scheduled. The patient understands the need to return immediately for any signs of infection to include swelling, pain, purulent discharge, localized warmth, or fever.    Documentation: I have reviewed the above documentation for accuracy and completeness, and I agree with the above.  [name], MD

## 2024-08-09 NOTE — Patient Instructions (Signed)

## 2024-08-23 ENCOUNTER — Ambulatory Visit: Admitting: Dermatology

## 2024-08-23 ENCOUNTER — Encounter: Payer: Self-pay | Admitting: Dermatology

## 2024-08-23 DIAGNOSIS — L905 Scar conditions and fibrosis of skin: Secondary | ICD-10-CM

## 2024-08-23 DIAGNOSIS — Z48817 Encounter for surgical aftercare following surgery on the skin and subcutaneous tissue: Secondary | ICD-10-CM

## 2024-08-23 DIAGNOSIS — L539 Erythematous condition, unspecified: Secondary | ICD-10-CM

## 2024-08-23 NOTE — Patient Instructions (Addendum)

## 2024-08-23 NOTE — Progress Notes (Unsigned)
   Follow Up Visit   Subjective  NEETA STOREY is a 83 y.o. 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 The patient has been bandaging the wound as directed. The endorse the following concerns: none   The following portions of the chart were reviewed this encounter and updated as appropriate: medications, allergies, medical history  Review of Systems:  No other skin or systemic complaints except as noted in HPI or Assessment and Plan.  Objective  Well appearing patient in no apparent distress; mood and affect are within normal limits.  A focal examination was performed including legs  All findings within normal limits unless otherwise noted below.  Healing wound with mild erythema  Relevant physical exam findings are noted in the Assessment and Plan.     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 , treated on 08/09/24, repaired with second intention - Reassured that wound is healing well - No evidence of infection - No swelling, induration, purulence, dehiscence, or tenderness out of proportion to the clinical exam, see photo above - Discussed that scars take up to 12 months to mature from the date of surgery - Recommend SPF 30+ to scar daily to prevent purple color from UV exposure during scar maturation process - Discussed that erythema and raised appearance of scar will fade over the next 4-6 months - OK to start scar massage at 4-6 weeks post-op - Can consider silicone based products for scar healing starting at 6 weeks post-op - Ok to continue ointment daily to wound under a bandage for another 2 weeks      Return for 2-3 weeks wound check.  I, Doyce Pan, CMA, am acting as scribe  for [name], MD.   Documentation: I have reviewed the above documentation for accuracy and completeness, and I agree with the above.  [name], MD

## 2024-08-25 ENCOUNTER — Encounter: Payer: Self-pay | Admitting: Dermatology

## 2024-09-04 ENCOUNTER — Ambulatory Visit
Admission: EM | Admit: 2024-09-04 | Discharge: 2024-09-04 | Disposition: A | Discharge status: Home / Self Care | Attending: Emergency Medicine | Admitting: Emergency Medicine

## 2024-09-04 DIAGNOSIS — J019 Acute sinusitis, unspecified: Secondary | ICD-10-CM | POA: Diagnosis not present

## 2024-09-04 DIAGNOSIS — I480 Paroxysmal atrial fibrillation: Secondary | ICD-10-CM

## 2024-09-04 MED ORDER — CEFDINIR 300 MG PO CAPS: 300.0000 mg | ORAL_CAPSULE | Freq: Two times a day (BID) | ORAL | 0 refills | Status: AC

## 2024-09-04 MED ORDER — FLUTICASONE PROPIONATE 50 MCG/ACT NA SUSP: 2.0000 | Freq: Every day | NASAL | 0 refills | Status: AC

## 2024-09-04 NOTE — ED Provider Notes (Signed)
 " HPI  SUBJECTIVE:  Alexandria Mcdowell is a 83 y.o. female who presents with 1 week of nasal congestion, clear rhinorrhea, occasional dry cough and postnasal drip.  No fevers, sinus pain or pressure, facial swelling, dental pain, sore throat, wheezing, chest pain, shortness of breath, double sickening.  Her husband has identical symptoms and is currently on doxycycline.  No allergy symptoms.  No antipyretic in the past 6 hours.  No antibiotics in the past month.  She has tried pushing fluids with improvement in her symptoms.  No aggravating factors.  She has a past medical history of hyperlipidemia, hypertension, paroxysmal atrial fibrillation status post ablation on Eliquis  and Cardizem .  States that she has not had any known episodes of atrial fibrillation since the ablation.  PCP: [facility].  Past Medical History:  Diagnosis Date   Allergy    Anemia    Atypical mole 10/20/2001   Left Mid to Upper Back-Slight to Moderate(no treatment)   Basal cell carcinoma of skin 07/07/2024   left lower leg- tx Mohs Dr. Corey 08/09/2024   BCC (basal cell carcinoma of skin) 02/07/2004   Left Shin(excision) Dr. 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 Helga   SCCA (squamous cell carcinoma) of skin 08/03/2000   Left Shin([place]) Dr. 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 Helga   SCCA (squamous cell carcinoma) of skin 09/17/2000   Right Forearm([place]) Dr. Georgena   SCCA (squamous cell carcinoma) of skin 09/17/2000   Right Lower Leg([place]) Dr. Georgena   SCCA (squamous cell carcinoma) of skin 09/17/2000    Left Upper Lower Leg,Lower Knee([place]) Dr. Georgena   SCCA (squamous cell carcinoma) of skin 09/17/2000   Left Lower Medial Leg([place]) Dr. Georgena   SCCA (squamous cell carcinoma) of skin 10/13/2000   Left Medial Ankle([place]) Dr. Georgena (excision)   SCCA (squamous cell carcinoma) of skin 10/13/2000   Right Lateral Ankle([place]) Dr. Georgena (excision)   SCCA (squamous cell carcinoma) of skin 12/03/2000   Left Upper Arm([place]) Dr. Georgena   SCCA (squamous cell carcinoma) of skin 12/03/2000   Left Med. Lower Leg([place]) Dr. Georgena (excision)   SCCA (squamous cell carcinoma) of skin 12/11/2000   Left Upper Thigh([place]) Dr. Georgena   SCCA (squamous cell carcinoma) of skin 12/15/2000   Left Thigh([place]) Dr. Georgena   SCCA (squamous cell carcinoma) of skin 01/18/2001   Left Forearm(in Situ) Dr. Helga   SCCA (squamous cell carcinoma) of skin 01/18/2001   Left Hand 2nd/3rd map(in situ) Dr. Helga)   SCCA (squamous cell carcinoma) of skin 01/18/2001   Left Shin Lat(in situ) Dr. Helga   SCCA (squamous cell carcinoma) of skin 01/18/2001   Left Calf(in situ) Dr. Helga   SCCA (squamous cell carcinoma) of skin 10/20/2001   Right Ear Lobe([place]) Dr. Helga (exc)   SCCA (squamous cell carcinoma) of skin 02/02/2002   Left Lower Leg Post Sup(in situ) Dr. Helga   SCCA (squamous cell carcinoma) of skin 02/07/2004   Left Shin Lateral(in situ) Dr. 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   Superficial basal cell carcinoma (BCC) 02/19/2000   Left Forearm (Dr. Helga)    Past Surgical History:  Procedure Laterality Date   APPENDECTOMY     ATRIAL FIBRILLATION ABLATION N/A 02/08/2019   Procedure: ATRIAL FIBRILLATION ABLATION;  Surgeon: Kelsie Agent, MD;  Location: [facility] INVASIVE CV [facility];  Service: Cardiovascular;  Laterality: N/A;   BREAST BIOPSY     CARDIOVERSION N/A 12/29/2018   Procedure: CARDIOVERSION;  Surgeon: Maranda Leim DEL, MD;  Location: [facility] ENDOSCOPY;  Service: Cardiovascular;  Laterality: N/A;   CATARACT EXTRACTION  05/05/13   right eye    COLONOSCOPY  2006   ECTOPIC PREGNANCY SURGERY  1970   WISDOM TOOTH EXTRACTION      Family History  Problem Relation Age of Onset   Hypertension Mother    Hyperlipidemia Mother    Cancer Mother        breast/colon   Heart disease Mother    Diabetes Father    Emphysema Father    Hyperlipidemia Sister    Hypertension Sister    Hypothyroidism Sister     Social History[1]  Current Medications[2]  Allergies[3]   ROS  As noted in HPI.   Physical Exam  BP 132/82 (BP Location: Left Arm)   Pulse (!) 125   Temp 98.4 F (36.9 C) (Oral)   Resp 16   Wt 52.6 kg   SpO2 95%   BMI 21.90 kg/m   Constitutional: Well developed, well nourished, no acute distress Eyes: PERRL, EOMI, conjunctiva normal bilaterally HENT: Normocephalic, atraumatic,mucus membranes moist.  Positive nasal congestion.  Normal turbinates.  No maxillary, frontal sinus tenderness.  Normal oropharynx.  No postnasal drip. Respiratory: Clear to auscultation bilaterally, no rales, no wheezing, no rhonchi Cardiovascular: Irregularly irregular, no murmurs, no gallops, no rubs GI: nondistended skin: No rash, skin intact Musculoskeletal: no  deformities Neurologic: Alert & oriented x 3, CN III-XII grossly intact, no motor deficits, sensation grossly intact Psychiatric: Speech and behavior appropriate   ED Course   Medications - No data to display  Orders Placed This Encounter  Procedures   ED EKG    Irregular tachycardia    Standing Status:   Standing    Number of Occurrences:   1    Reason for Exam:   Other (see Comments)   No results found for this or any previous visit (from the past 24 hours). No results found.  ED Clinical Impression  1. Acute non-recurrent sinusitis, unspecified location   2. Paroxysmal atrial fibrillation [facility])      ED Assessment/Plan     Patient presents with acute illness with systemic symptoms of tachycardia.  1.  Sinusitis.  She does not yet meet criteria for antibiotics per the ISDA, but she is close.  Will have her try Mucinex, saline irrigation, Flonase .  She has anaphylaxis with penicillins and nausea and vomiting with tetracyclines.  She has tolerated cephalosporins before.  Will send home  with a wait-and-see prescription of Omnicef  300 mg p.o. twice daily for 7 days for sinus infection.  She is to start this if not better in 3 days.  2.  Irregularly irregular heartbeat.  Will check EKG confirm atrial fibrillation.  She is otherwise asymptomatic and is anticoagulated.  Denies chest pain, shortness of breath, lightheadedness, dizziness, lower extremity edema, dyspnea on exertion.  Advised her to call cardiology and let them know that she is back in atrial fibrillation, and to asked them if she needs to be seen immediately or if she can wait until her send in January.  She agrees to do this.  EKG: Atrial fibrillation with RVR, right 133.  Normal axis.  No ST elevation.  No change compared to EKG from April 2025  Discussed EKG, MDM, treatment plan, and plan for follow-up with patient Discussed sn/sx that should prompt return to the ED. patient agrees with plan.   Meds ordered this  encounter  Medications   fluticasone  (FLONASE ) 50 MCG/ACT nasal spray    Sig: Place 2 sprays into both nostrils daily.    Dispense:  16 g    Refill:  0   cefdinir  (OMNICEF ) 300 MG capsule    Sig: Take 1 capsule (300 mg total) by mouth 2 (two) times daily for 7 days.    Dispense:  14 capsule    Refill:  0      *This clinic note was created using Scientist, clinical (histocompatibility and immunogenetics). Therefore, there may be occasional mistakes despite careful proofreading. ?     [1]  Social History Tobacco Use   Smoking status: Never   Smokeless tobacco: Never   Tobacco comments:    Never smoke 12/30/22  Vaping Use   Vaping status: Never Used  Substance Use Topics   Alcohol use: No   Drug use: No  [2] No current facility-administered medications for this encounter.  Current Outpatient Medications:    alendronate (FOSAMAX) 35 MG tablet, Take 35 mg by mouth every Tuesday. Take with a full glass of water on an empty stomach., Disp: , Rfl:    apixaban  (ELIQUIS ) 2.5 MG TABS tablet, TAKE 1 TABLET TWICE DAILY, Disp: 180 tablet, Rfl: 1   atenolol  (TENORMIN ) 100 MG tablet, 1 tablet Orally Once a day; Duration: 30 day(s), Disp: , Rfl:    atorvastatin  (LIPITOR) 10 MG tablet, TAKE 1 TABLET EVERY DAY, Disp: 90 tablet, Rfl: 3   cefdinir  (OMNICEF ) 300 MG capsule, Take 1 capsule (300 mg total) by mouth 2 (two) times daily for 7 days., Disp: 14 capsule, Rfl: 0   Cholecalciferol (D3 VITAMIN PO), Take 10 mcg by mouth 2 (two) times daily., Disp: , Rfl:    diltiazem  (CARDIZEM  CD) 300 MG 24 hr capsule, Take 1 capsule (300 mg total) by mouth daily., Disp: 90 capsule, Rfl: 1   diltiazem  (CARDIZEM ) 30 MG tablet, TAKE 1 TABLET EVERY 4 HOURS AS NEEDED FOR HEART RATE OVER 100, Disp: 90 tablet, Rfl: 2   diltiazem  (TIAZAC ) 240 MG 24 hr capsule, 1 capsule., Disp: , Rfl:    fluticasone  (FLONASE ) 50 MCG/ACT nasal spray, Place 2 sprays into both nostrils daily., Disp: 16 g, Rfl: 0   lisinopril -hydrochlorothiazide  (ZESTORETIC ) 10-12.5 MG  tablet, Take 1 tablet by mouth daily., Disp: 90 tablet, Rfl: 3   Multiple Vitamins-Minerals (MULTIVITAMIN WITH MINERALS) tablet, Take 1 tablet by mouth daily with supper. , Disp: , Rfl:    mupirocin  ointment (BACTROBAN ) 2 %, Apply 1 Application topically 2 (two) times daily., Disp: 22  g, Rfl: 3   Omega-3 Fatty Acids (FISH OIL) 1200 MG CAPS, Take 1,200 mg by mouth daily with supper. , Disp: , Rfl:    Polyethyl Glycol-Propyl Glycol 0.4-0.3 % SOLN, Place 1 drop into both eyes 3 (three) times daily as needed (for dry/irritated eyes.)., Disp: , Rfl:    sodium chloride  (OCEAN) 0.65 % SOLN nasal spray, Place 1 spray into both nostrils 4 (four) times daily as needed for congestion (allergies.). , Disp: , Rfl:    VITAMIN D  PO, 1 capsule., Disp: , Rfl:  [3]  Allergies Allergen Reactions   Clindamycin Hcl Nausea And Vomiting    nervous   Codeine Nausea And Vomiting   Erythromycin Nausea And Vomiting   Tetracycline Nausea And Vomiting   Penicillins Rash    Did it involve swelling of the face/tongue/throat, SOB, or low BP? No Did it involve sudden or severe rash/hives, skin peeling, or any reaction on the inside of your mouth or nose? Yes Did you need to seek medical attention at a hospital or doctor's office? Yes When did it last happen?     childhood  If all above answers are NO, may proceed with cephalosporin use.    Sulfonamide Derivatives Nausea And Vomiting and Rash     Van Knee, MD 09/04/24 1324  "

## 2024-09-04 NOTE — Discharge Instructions (Signed)
 Start Mucinex to keep the mucous thin and to decongest you.   You may take1000 mg of tylenol  up to 3-4 times a day as needed for pain.  No nasal help with the nasal congestion.  Most sinus infections are viral and do not need antibiotics unless you have a high fever, have had this for 10 days, or you get better and then get sick again. Use a NeilMed sinus rinse with distilled water as often as you want to to reduce nasal congestion. Follow the directions on the box.   If you are not better in 3 days, or if you get worse, then go ahead and start the Omnicef .  Give your cardiologist a call and see if they want you to come in to be evaluated sooner than your appointment in January as you are back in atrial fibrillation.  Go to www.goodrx.com to look up your medications. This will give you a list of where you can find your prescriptions at the most affordable prices. Or you can ask the pharmacist what the cash price is. This is frequently cheaper than going through insurance.

## 2024-09-04 NOTE — ED Triage Notes (Signed)
 Pt c/o nasal congestion x1week  Pt has been using Nasal sprays for congestion

## 2024-09-13 ENCOUNTER — Ambulatory Visit: Admitting: Dermatology

## 2024-09-19 ENCOUNTER — Encounter: Payer: Self-pay | Admitting: Physician Assistant

## 2024-09-19 ENCOUNTER — Ambulatory Visit: Admitting: Physician Assistant

## 2024-09-19 DIAGNOSIS — W908XXA Exposure to other nonionizing radiation, initial encounter: Secondary | ICD-10-CM | POA: Diagnosis not present

## 2024-09-19 DIAGNOSIS — Z1283 Encounter for screening for malignant neoplasm of skin: Secondary | ICD-10-CM | POA: Diagnosis not present

## 2024-09-19 DIAGNOSIS — L814 Other melanin hyperpigmentation: Secondary | ICD-10-CM

## 2024-09-19 DIAGNOSIS — D1801 Hemangioma of skin and subcutaneous tissue: Secondary | ICD-10-CM

## 2024-09-19 DIAGNOSIS — D485 Neoplasm of uncertain behavior of skin: Secondary | ICD-10-CM | POA: Diagnosis not present

## 2024-09-19 DIAGNOSIS — L821 Other seborrheic keratosis: Secondary | ICD-10-CM

## 2024-09-19 DIAGNOSIS — C44712 Basal cell carcinoma of skin of right lower limb, including hip: Secondary | ICD-10-CM | POA: Diagnosis not present

## 2024-09-19 DIAGNOSIS — D0462 Carcinoma in situ of skin of left upper limb, including shoulder: Secondary | ICD-10-CM | POA: Diagnosis not present

## 2024-09-19 DIAGNOSIS — L578 Other skin changes due to chronic exposure to nonionizing radiation: Secondary | ICD-10-CM

## 2024-09-19 DIAGNOSIS — D229 Melanocytic nevi, unspecified: Secondary | ICD-10-CM

## 2024-09-19 DIAGNOSIS — C4492 Squamous cell carcinoma of skin, unspecified: Secondary | ICD-10-CM

## 2024-09-19 DIAGNOSIS — Z85828 Personal history of other malignant neoplasm of skin: Secondary | ICD-10-CM | POA: Diagnosis not present

## 2024-09-19 DIAGNOSIS — T8131XA Disruption of external operation (surgical) wound, not elsewhere classified, initial encounter: Secondary | ICD-10-CM

## 2024-09-19 DIAGNOSIS — C44629 Squamous cell carcinoma of skin of left upper limb, including shoulder: Secondary | ICD-10-CM

## 2024-09-19 HISTORY — DX: Squamous cell carcinoma of skin, unspecified: C44.92

## 2024-09-19 MED ORDER — DOXYCYCLINE HYCLATE 100 MG PO TABS: 100.0000 mg | ORAL_TABLET | Freq: Two times a day (BID) | ORAL | 0 refills | Status: AC

## 2024-09-19 MED ORDER — DOXYCYCLINE HYCLATE 100 MG PO TABS: 100.0000 mg | ORAL_TABLET | Freq: Two times a day (BID) | ORAL | 0 refills | Status: DC

## 2024-09-19 NOTE — Patient Instructions (Signed)

## 2024-09-19 NOTE — Progress Notes (Signed)
 "  Follow-Up Visit   Subjective  Alexandria Mcdowell is a 84 y.o. female ESTABLISHED PATIENT who presents for the following: Skin Cancer Screening and Full Body Skin Exam - History of BCC and SCC  History of multiple non-melanoma skin cancers. Has undergone Mohs surgery here with Dr. Corey - most recent was her left lower leg. Has a few bumps that she is concerned about near the excision site.     The following portions of the chart were reviewed this encounter and updated as appropriate: medications, allergies, medical history  Review of Systems:  No other skin or systemic complaints except as noted in HPI or Assessment and Plan.  Objective  Well appearing patient in no apparent distress; mood and affect are within normal limits.  A full examination was performed including scalp, head, eyes, ears, nose, lips, neck, chest, axillae, abdomen, back, buttocks, bilateral upper extremities, bilateral lower extremities, hands, feet, fingers, toes, fingernails, and toenails. All findings within normal limits unless otherwise noted below.   Relevant physical exam findings are noted in the Assessment and Plan.  Left mid dorsal forearm radial side 9 mm erythematous scaly patch  Left mid dorsal forearm ulnar side 2.2 cm erythematous scaly plaque  Right distal lower leg 1.0 cm erythematous scaly plaque  Left Lower Leg - Anterior Healing Mohs site with spitting suture reaction   Assessment & Plan   SKIN CANCER SCREENING PERFORMED TODAY.  ACTINIC DAMAGE - Chronic condition, secondary to cumulative UV/sun exposure - diffuse scaly erythematous macules with underlying dyspigmentation - Recommend daily broad spectrum sunscreen SPF 30+ to sun-exposed areas, reapply every 2 hours as needed.  - Staying in the shade or wearing long sleeves, sun glasses (UVA+UVB protection) and wide brim hats (4-inch brim around the entire circumference of the hat) are also recommended for sun protection.  - Call for  new or changing lesions.  LENTIGINES, SEBORRHEIC KERATOSES, HEMANGIOMAS - Benign normal skin lesions - Benign-appearing - Call for any changes  MELANOCYTIC NEVI - Tan-brown and/or pink-flesh-colored symmetric macules and papules - Benign appearing on exam today - Observation - Call clinic for new or changing moles - Recommend daily use of broad spectrum spf 30+ sunscreen to sun-exposed areas.    HISTORY OF SQUAMOUS CELL CARCINOMA OF THE SKIN - No evidence of recurrence today - No lymphadenopathy - Recommend regular full body skin exams - Recommend daily broad spectrum sunscreen SPF 30+ to sun-exposed areas, reapply every 2 hours as needed.  - Call if any new or changing lesions are noted between office visits   HISTORY OF BASAL CELL CARCINOMA OF THE SKIN - No evidence of recurrence today - Recommend regular full body skin exams - Recommend daily broad spectrum sunscreen SPF 30+ to sun-exposed areas, reapply every 2 hours as needed.  - Call if any new or changing lesions are noted between office visits    NEOPLASM OF UNCERTAIN BEHAVIOR OF SKIN (3) Left mid dorsal forearm radial side - Skin / nail biopsy Type of biopsy: tangential   Informed consent: discussed and consent obtained   Timeout: patient name, date of birth, surgical site, and procedure verified   Procedure prep:  Patient was prepped and draped in usual sterile fashion Prep type:  Isopropyl alcohol Anesthesia: the lesion was anesthetized in a standard fashion   Anesthetic:  1% lidocaine  w/ epinephrine 1-100,000 buffered w/ 8.4% NaHCO3 Instrument used: flexible razor blade   Hemostasis achieved with: pressure, aluminum chloride and electrodesiccation   Outcome: patient tolerated procedure  well   Post-procedure details: sterile dressing applied and wound care instructions given   Dressing type: bandage and petrolatum    Specimen 1 - Surgical pathology Differential Diagnosis: SCC vs other   Check Margins:  No Left mid dorsal forearm ulnar side - Skin / nail biopsy Type of biopsy: tangential   Informed consent: discussed and consent obtained   Timeout: patient name, date of birth, surgical site, and procedure verified   Procedure prep:  Patient was prepped and draped in usual sterile fashion Prep type:  Isopropyl alcohol Anesthesia: the lesion was anesthetized in a standard fashion   Anesthetic:  1% lidocaine  w/ epinephrine 1-100,000 buffered w/ 8.4% NaHCO3 Instrument used: flexible razor blade   Hemostasis achieved with: pressure, aluminum chloride and electrodesiccation   Outcome: patient tolerated procedure well   Post-procedure details: sterile dressing applied and wound care instructions given   Dressing type: bandage and petrolatum    Specimen 2 - Surgical pathology Differential Diagnosis: SCC vs other   Check Margins: No Right distal lower leg - Skin / nail biopsy Type of biopsy: tangential   Informed consent: discussed and consent obtained   Timeout: patient name, date of birth, surgical site, and procedure verified   Procedure prep:  Patient was prepped and draped in usual sterile fashion Prep type:  Isopropyl alcohol Anesthesia: the lesion was anesthetized in a standard fashion   Anesthetic:  1% lidocaine  w/ epinephrine 1-100,000 buffered w/ 8.4% NaHCO3 Instrument used: flexible razor blade   Hemostasis achieved with: pressure, aluminum chloride and electrodesiccation   Outcome: patient tolerated procedure well   Post-procedure details: sterile dressing applied and wound care instructions given   Dressing type: bandage and petrolatum    Specimen 3 - Surgical pathology Differential Diagnosis: SCC vs other   Check Margins: No HISTORY OF BASAL CELL CARCINOMA (BCC) Left Lower Leg - Anterior Start doxycycline  100 mg take 1 tablet twice daily for 1 week. This Visit - doxycycline  (VIBRA -TABS) 100 MG tablet - Take 1 tablet (100 mg total) by mouth 2 (two) times daily. Take  with food and plenty of fluid ACTINIC SKIN DAMAGE   CHERRY ANGIOMA   SEBORRHEIC KERATOSIS   MULTIPLE BENIGN NEVI   LENTIGINES   SCREENING EXAM FOR SKIN CANCER   SPITTING SUTURE, INITIAL ENCOUNTER   Return in about 1 week (around 09/26/2024) for wound check with Dr Corey then 6 monyh FBSE with Erminio.  I, Roseline Hutchinson, CMA, am acting as scribe for Cassandria Drew K, PA-C .   Documentation: I have reviewed the above documentation for accuracy and completeness, and I agree with the above.  Jahmil Macleod K, PA-C    "

## 2024-09-20 ENCOUNTER — Ambulatory Visit: Payer: Self-pay | Admitting: Physician Assistant

## 2024-09-20 LAB — SURGICAL PATHOLOGY

## 2024-09-27 ENCOUNTER — Ambulatory Visit: Admitting: Dermatology

## 2024-09-27 ENCOUNTER — Encounter: Payer: Self-pay | Admitting: Dermatology

## 2024-09-27 DIAGNOSIS — D099 Carcinoma in situ, unspecified: Secondary | ICD-10-CM

## 2024-09-27 DIAGNOSIS — L905 Scar conditions and fibrosis of skin: Secondary | ICD-10-CM | POA: Diagnosis not present

## 2024-09-27 DIAGNOSIS — S81802A Unspecified open wound, left lower leg, initial encounter: Secondary | ICD-10-CM | POA: Diagnosis not present

## 2024-09-27 DIAGNOSIS — T1490XD Injury, unspecified, subsequent encounter: Secondary | ICD-10-CM

## 2024-09-27 DIAGNOSIS — Z48817 Encounter for surgical aftercare following surgery on the skin and subcutaneous tissue: Secondary | ICD-10-CM

## 2024-09-27 DIAGNOSIS — C4491 Basal cell carcinoma of skin, unspecified: Secondary | ICD-10-CM

## 2024-09-27 NOTE — Patient Instructions (Signed)

## 2024-09-27 NOTE — Progress Notes (Signed)
" ° °  Follow Up Visit   Subjective  Alexandria Mcdowell is a 84 y.o. female who presents for the following: follow up from Mohs surgery   The patient presents for follow up from Mohs surgery for a BCC on the left lower leg, treated on 08/09/24, repaired with keystone flap. The patient has been bandaging the wound as directed. The endorse the following concerns: none  The following portions of the chart were reviewed this encounter and updated as appropriate: medications, allergies, medical history  Review of Systems:  No other skin or systemic complaints except as noted in HPI or Assessment and Plan.  Objective  Well appearing patient in no apparent distress; mood and affect are within normal limits.  A focal examination was performed including scalp, head, face and left lower leg. All findings within normal limits unless otherwise noted below.  Healing wound with mild erythema  Relevant physical exam findings are noted in the Assessment and Plan.    Assessment & Plan   Healing Wound s/p Mohs for [facility], treated on 08/09/24, repaired with keystone flap - Reassured that wound is healing well - Spitting suture removed - No evidence of infection - No swelling, induration, purulence, dehiscence, or tenderness out of proportion to the clinical exam, see photo above - Discussed that scars take up to 12 months to mature from the date of surgery - Recommend SPF 30+ to scar daily to prevent purple color from UV exposure during scar maturation process - Discussed that erythema and raised appearance of scar will fade over the next 4-6 months - OK to start scar massage at 4-6 weeks post-op - Can consider silicone based products for scar healing starting at 6 weeks post-op  Moderately Differentiated Squamous Cell Carcinoma Exam: left mid dorsal forearm radial side  Treatment Plan:   Mohs 11/28/2024  SQUAMOUS CELL CARCINOMA IN SITU  Exam: left mid dorsal forearm ulnar side  Treatment Plan: Mohs  12/19/2024   BASAL CELL CARCINOMA  Exam: right distal lower leg  Treatment Plan: Mohs 01/09/2025    Return for Mohs t1 x 3.  I, [name], CMA, am acting as scribe for Alexandria CHRISTELLA HOLY, MD.   Documentation: I have reviewed the above documentation for accuracy and completeness, and I agree with the above.  Alexandria CHRISTELLA HOLY, MD  "

## 2024-10-05 ENCOUNTER — Other Ambulatory Visit: Payer: Self-pay | Admitting: Cardiovascular Disease

## 2024-10-05 DIAGNOSIS — I480 Paroxysmal atrial fibrillation: Secondary | ICD-10-CM

## 2024-10-10 ENCOUNTER — Telehealth: Payer: Self-pay

## 2024-10-10 NOTE — Telephone Encounter (Signed)
 Pt called to see if we had sent in a prescription for her after last visit--I told her we had not

## 2024-11-18 ENCOUNTER — Ambulatory Visit

## 2024-11-28 ENCOUNTER — Encounter: Admitting: Dermatology

## 2024-12-19 ENCOUNTER — Encounter: Admitting: Dermatology

## 2025-01-02 ENCOUNTER — Ambulatory Visit: Admitting: Physician Assistant

## 2025-01-09 ENCOUNTER — Encounter: Admitting: Dermatology

## 2025-03-20 ENCOUNTER — Ambulatory Visit: Admitting: Physician Assistant
